# Patient Record
Sex: Female | Born: 1985 | Race: White | Hispanic: No | Marital: Single | Smoking: Current every day smoker
Health system: Southern US, Community
[De-identification: ages and names within clinical notes are randomized; demographics above are authoritative.]

## PROBLEM LIST (undated history)

## (undated) ENCOUNTER — Ambulatory Visit

## (undated) ENCOUNTER — Inpatient Hospital Stay (HOSPITAL_COMMUNITY): Payer: Self-pay

## (undated) DIAGNOSIS — N63 Unspecified lump in unspecified breast: Secondary | ICD-10-CM

## (undated) DIAGNOSIS — R45851 Suicidal ideations: Secondary | ICD-10-CM

## (undated) DIAGNOSIS — N289 Disorder of kidney and ureter, unspecified: Secondary | ICD-10-CM

## (undated) DIAGNOSIS — Z720 Tobacco use: Secondary | ICD-10-CM

## (undated) DIAGNOSIS — J069 Acute upper respiratory infection, unspecified: Secondary | ICD-10-CM

## (undated) DIAGNOSIS — J45909 Unspecified asthma, uncomplicated: Secondary | ICD-10-CM

## (undated) DIAGNOSIS — K219 Gastro-esophageal reflux disease without esophagitis: Secondary | ICD-10-CM

## (undated) HISTORY — PX: TRACHEOSTOMY: SUR1362

## (undated) HISTORY — DX: Acute upper respiratory infection, unspecified: J06.9

## (undated) HISTORY — PX: CHOLECYSTECTOMY: SHX55

## (undated) HISTORY — PX: CSF SHUNT: SHX92

## (undated) HISTORY — DX: Unspecified lump in unspecified breast: N63.0

## (undated) HISTORY — PX: BOWEL RESECTION: SHX1257

## (undated) HISTORY — DX: Tobacco use: Z72.0

## (undated) HISTORY — DX: Suicidal ideations: R45.851

---
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
## 2008-04-23 ENCOUNTER — Emergency Department (HOSPITAL_COMMUNITY): Admission: EM | Admit: 2008-04-23 | Discharge: 2008-04-23 | Payer: Self-pay | Admitting: Emergency Medicine

## 2008-04-23 IMAGING — US US PELVIS COMPLETE MODIFY
1 series · 14 of 25 positions shown · non-contrast
Comparison: None available

CLINICAL DATA: Adnexal tenderness

TRANSABDOMINAL AND TRANSVAGINAL ULTRASOUND OF PELVIS
TECHNIQUE: Both transabdominal and transvaginal ultrasound
examinations of the pelvis were performed including evaluation of
the uterus, ovaries, adnexal regions, and pelvic cul-de-sac.

[Series 1: unknown · 0.22mm/px · 14 of 53 slices shown]
[im 1/53]
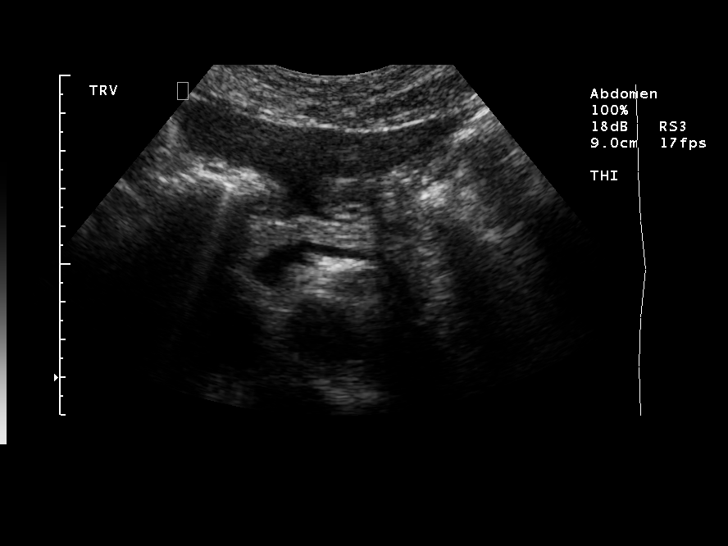
[im 5/53]
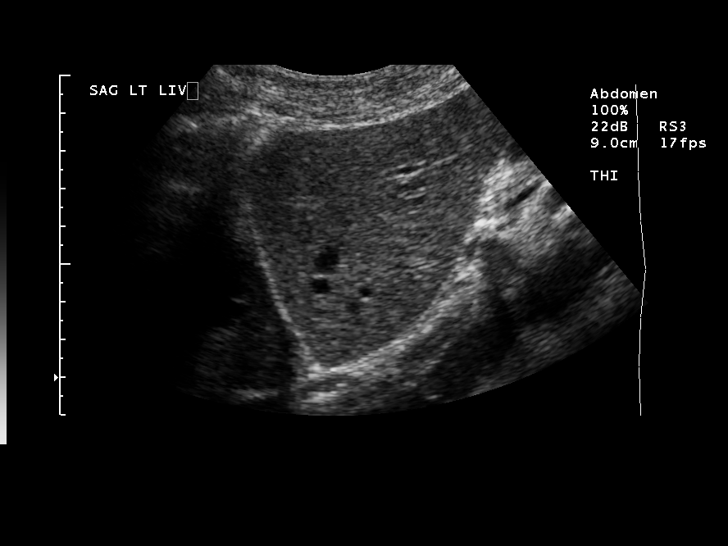
[im 9/53]
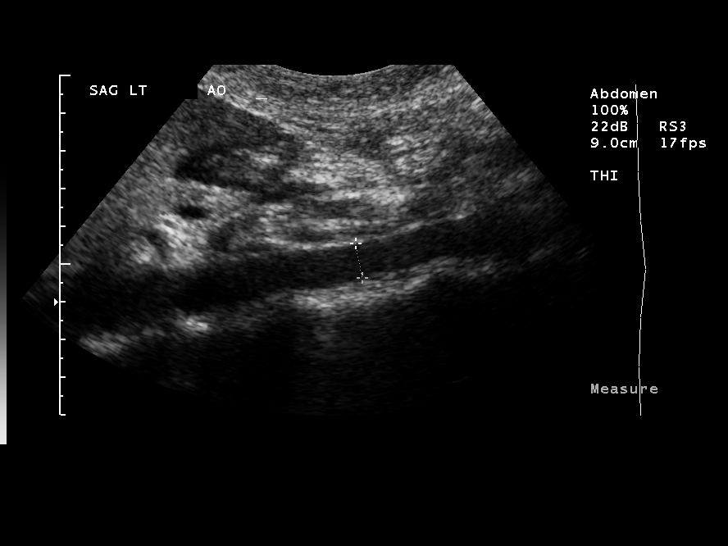
[im 14/53]
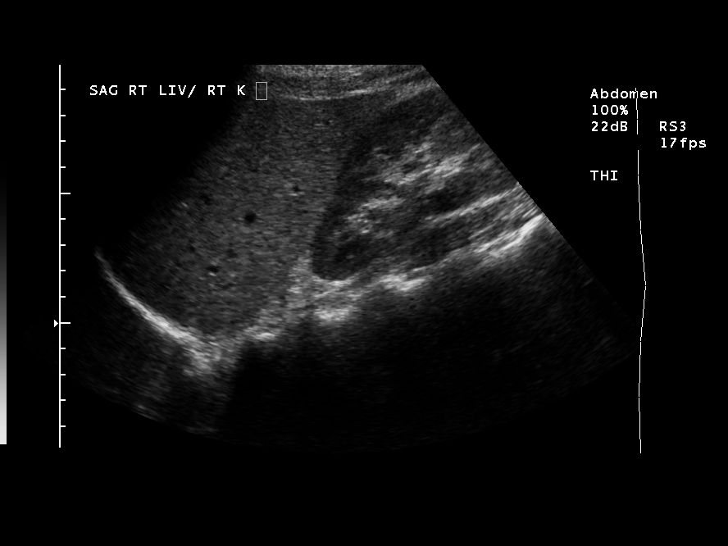
[im 18/53]
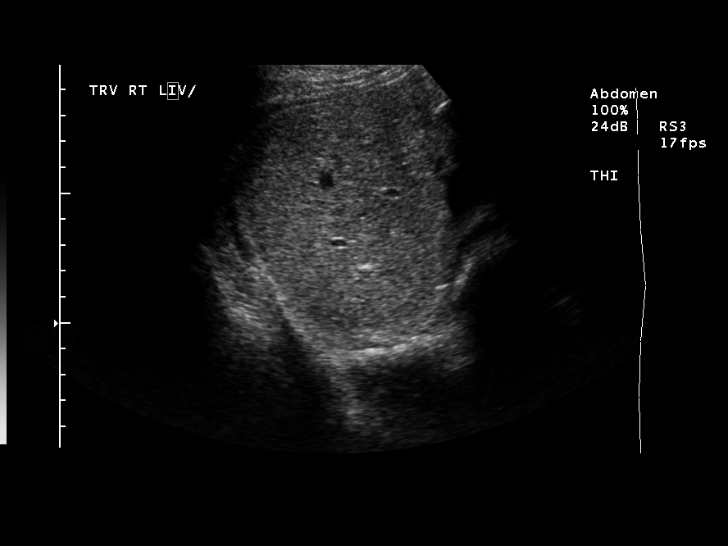
[im 20/53]
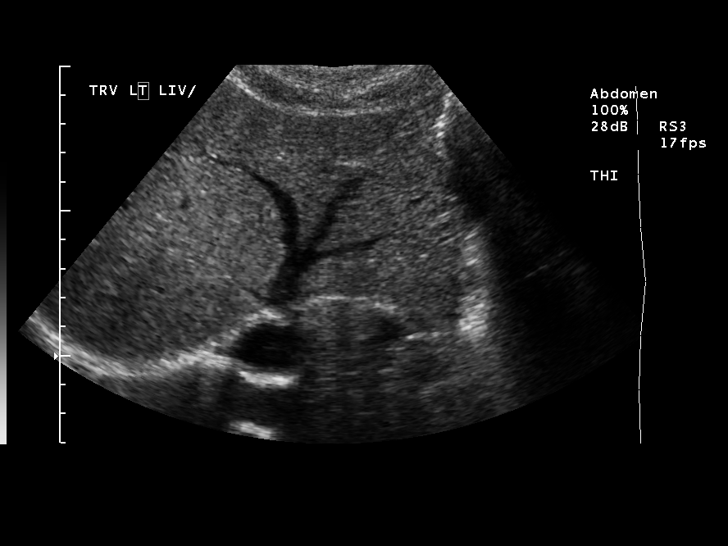
[im 24/53]
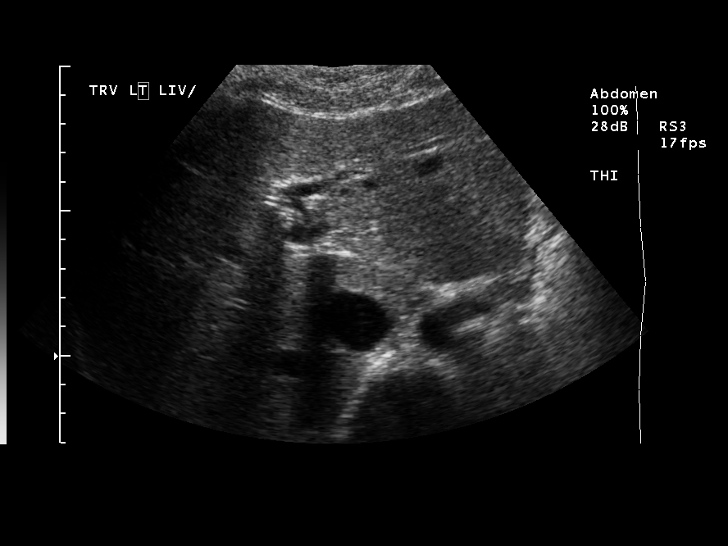
[im 29/53]
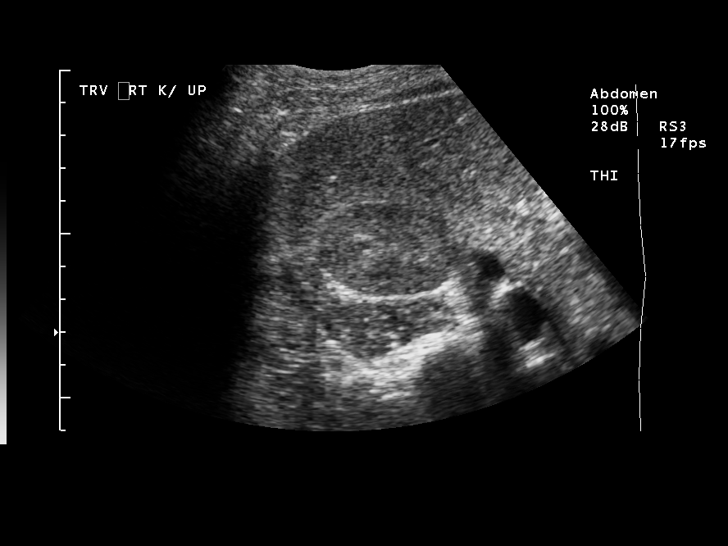
[im 33/53]
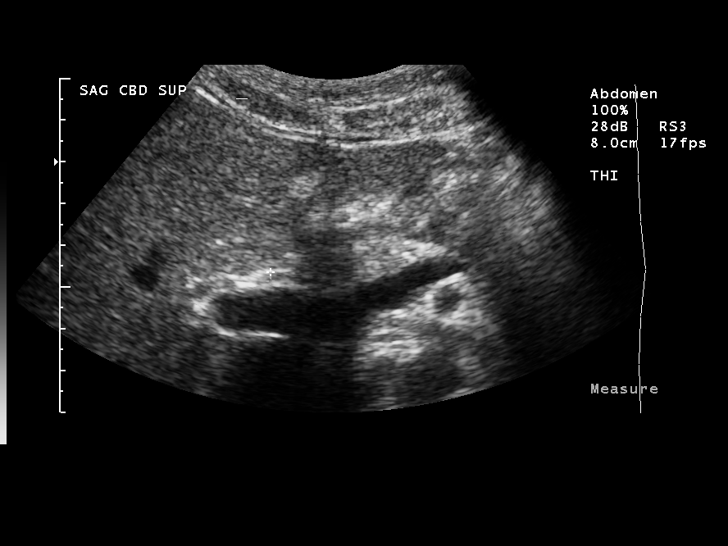
[im 35/53]
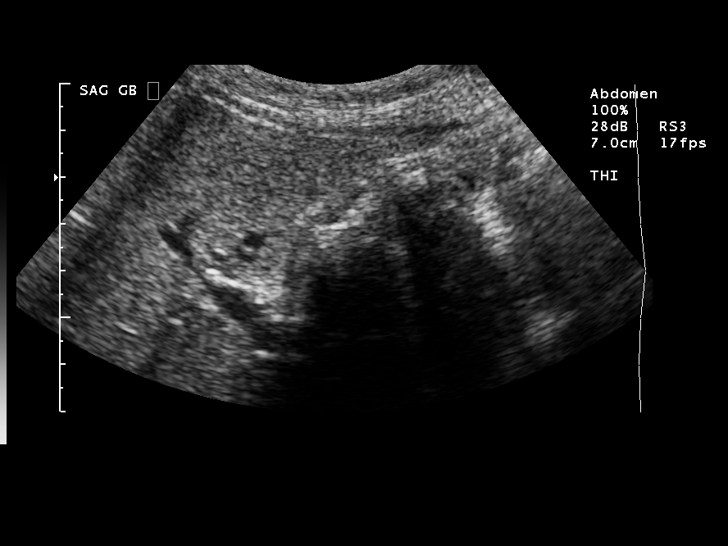
[im 40/53]
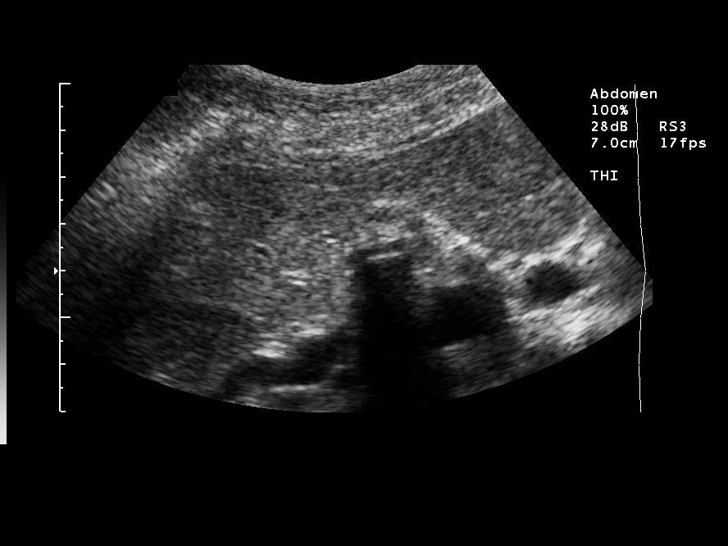
[im 44/53]
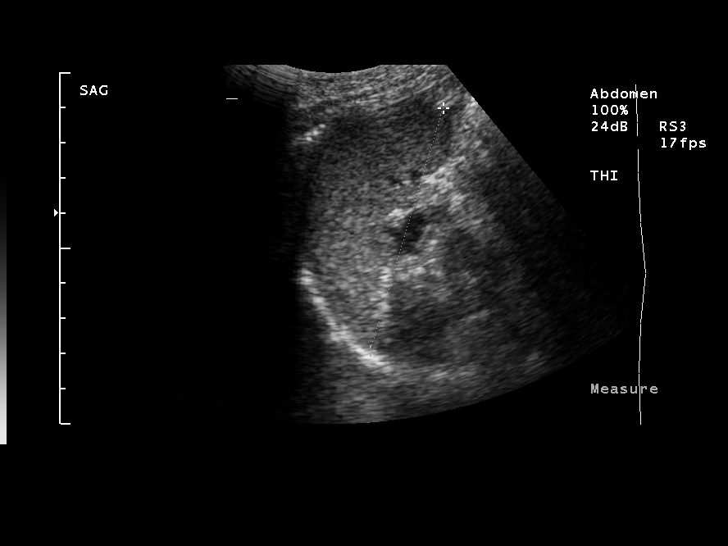
[im 48/53]
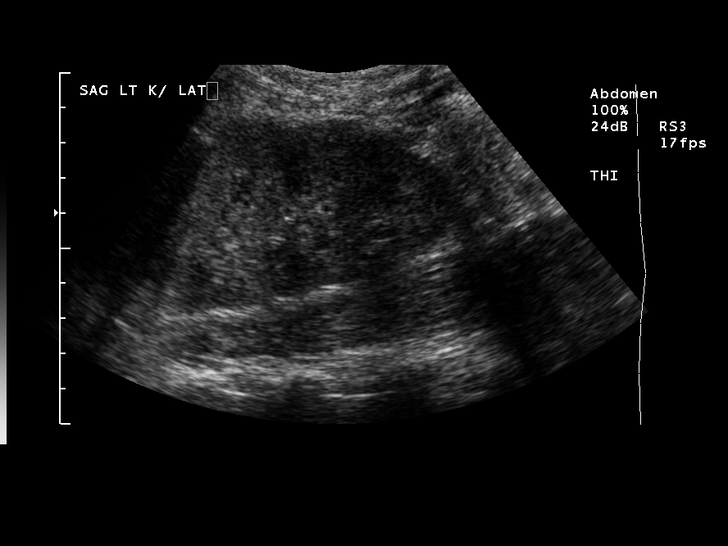
[im 53/53]
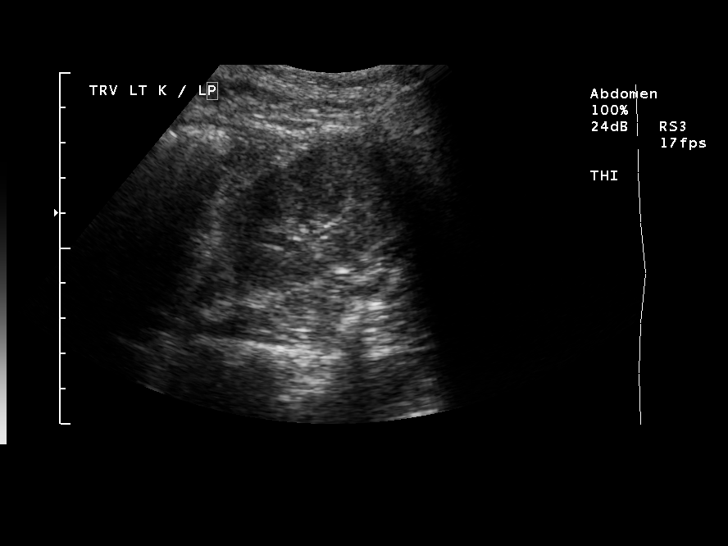

[14 of 25 positions shown; findings below may reference images not displayed]

FINDINGS: The uterus measures 33 x 33 x 61 mm with a normal-
appearing endometrial stripe measuring 1-5 mm in thickness.  Right
ovary 28 x 28 x 34 mm containing normal-appearing follicles with
normal color Doppler flow signal.  Left ovary 14 x 28 x 29 mm,
containing several normal-appearing follicles, with normal color
Doppler flow signal.  Layering debris is suggested in the urinary
bladder. No free fluid.  No adnexal mass.
IMPRESSION: 1.  Unremarkable uterus and ovaries.
2.  Layering debris in the urinary bladder, nonspecific.

## 2008-04-23 IMAGING — US US PELVIS COMPLETE MODIFY
1 series · 14 of 25 positions shown · non-contrast
Comparison: None available

CLINICAL DATA: Adnexal tenderness

TRANSABDOMINAL AND TRANSVAGINAL ULTRASOUND OF PELVIS
TECHNIQUE: Both transabdominal and transvaginal ultrasound
examinations of the pelvis were performed including evaluation of
the uterus, ovaries, adnexal regions, and pelvic cul-de-sac.

[Series 1: unknown · 0.23mm/px · 14 of 33 slices shown]
[im 1/33]
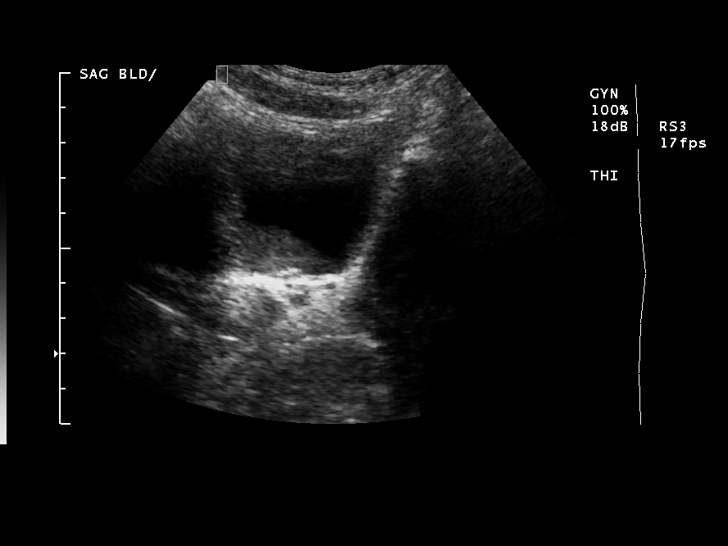
[im 3/33]
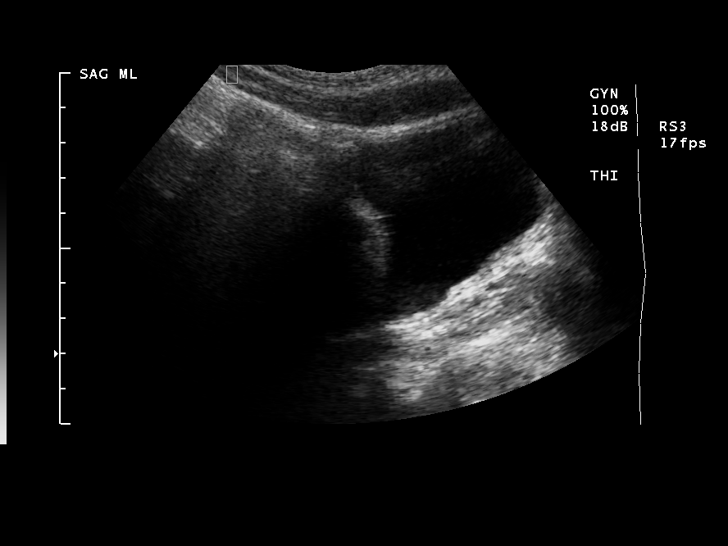
[im 6/33]
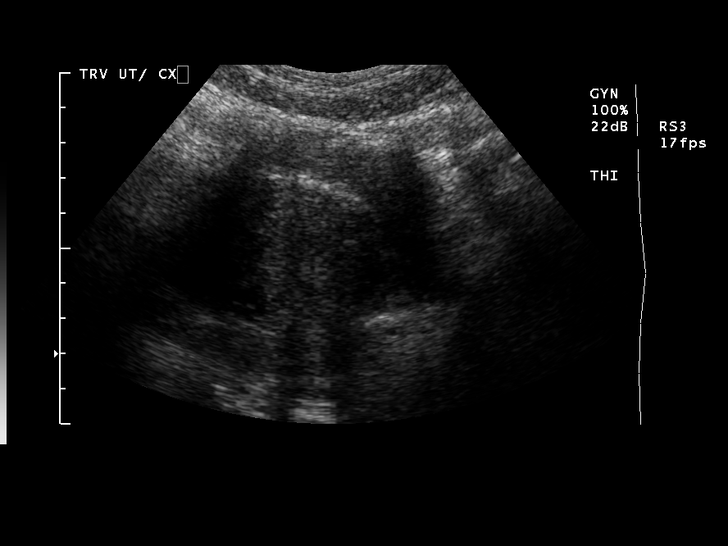
[im 9/33]
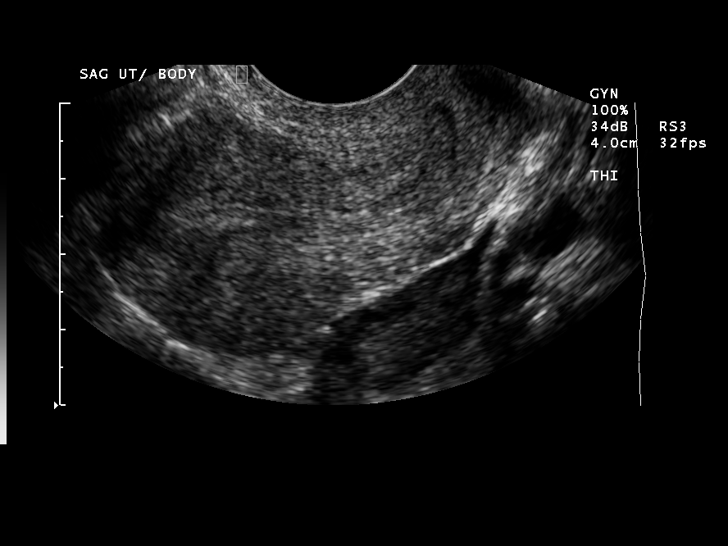
[im 11/33]
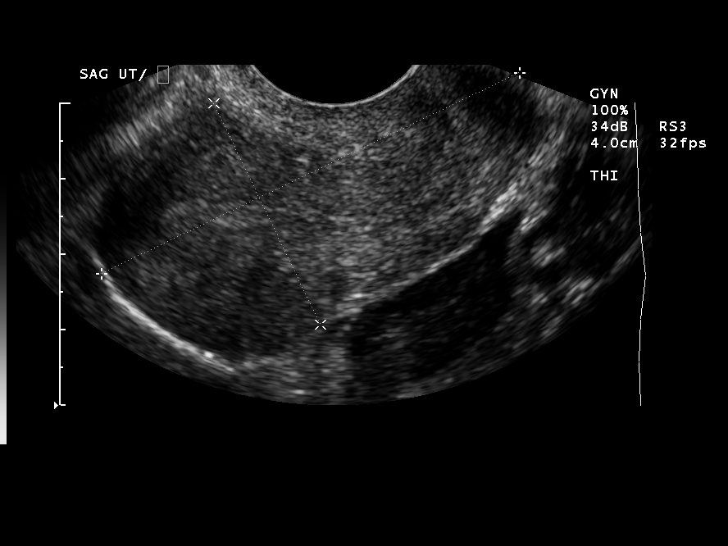
[im 13/33]
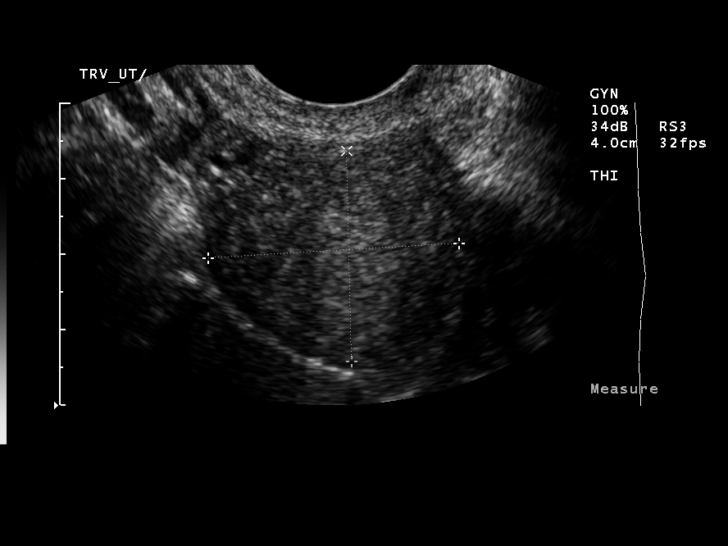
[im 15/33]
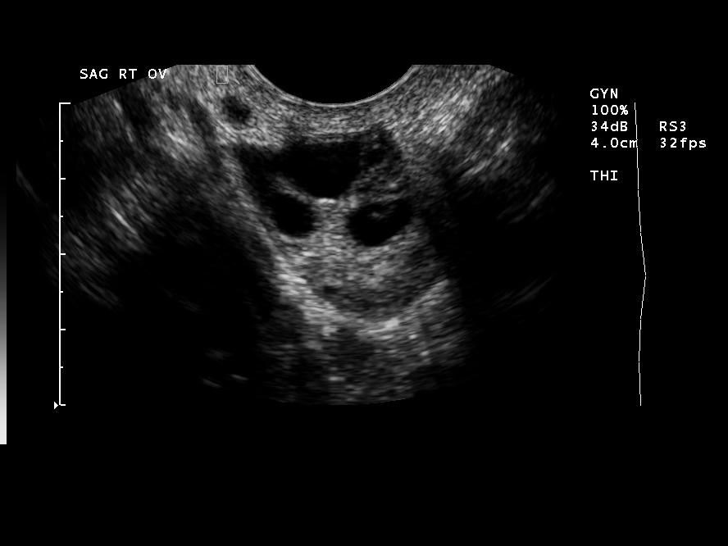
[im 18/33]
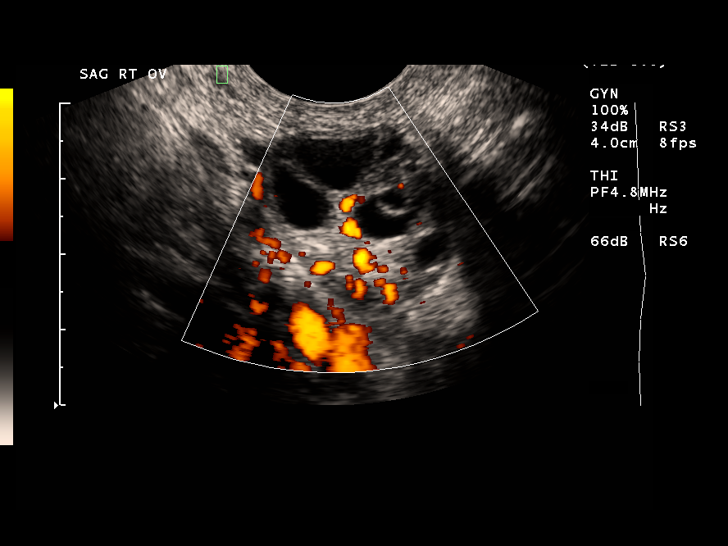
[im 21/33]
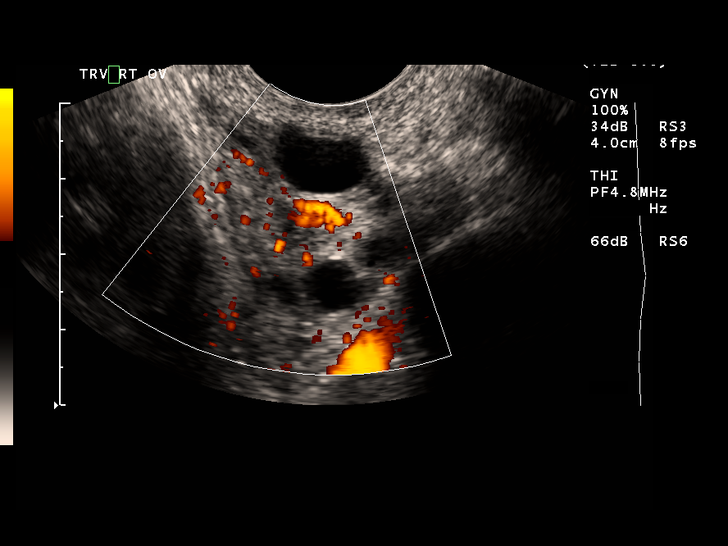
[im 22/33]
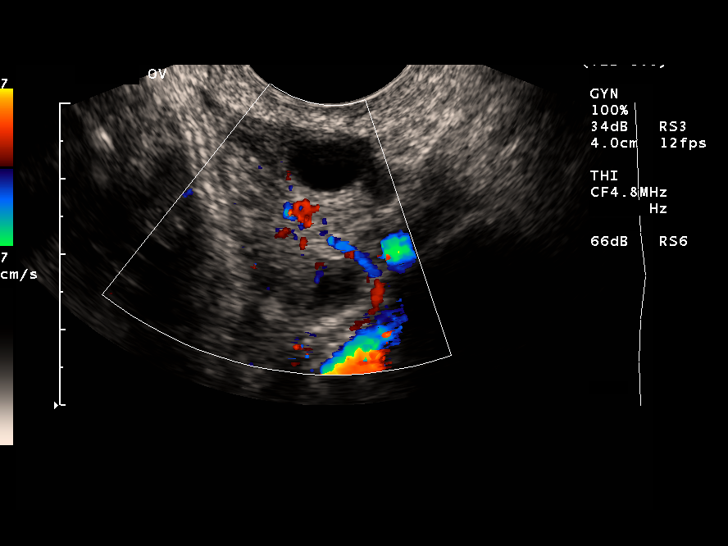
[im 25/33]
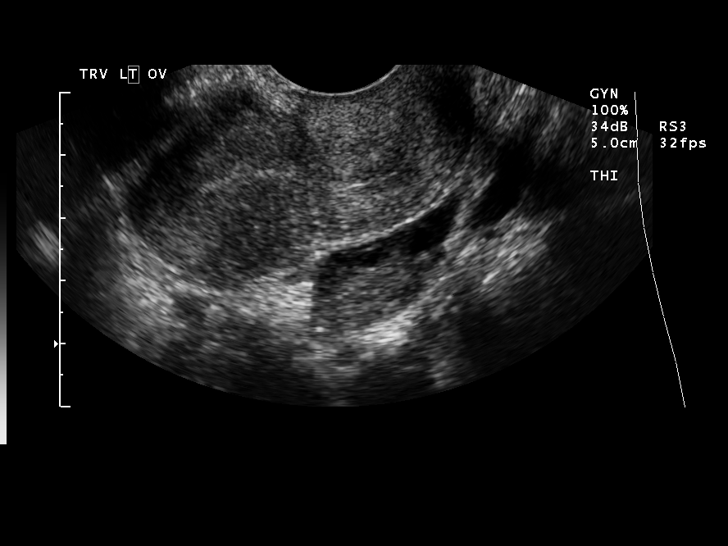
[im 27/33]
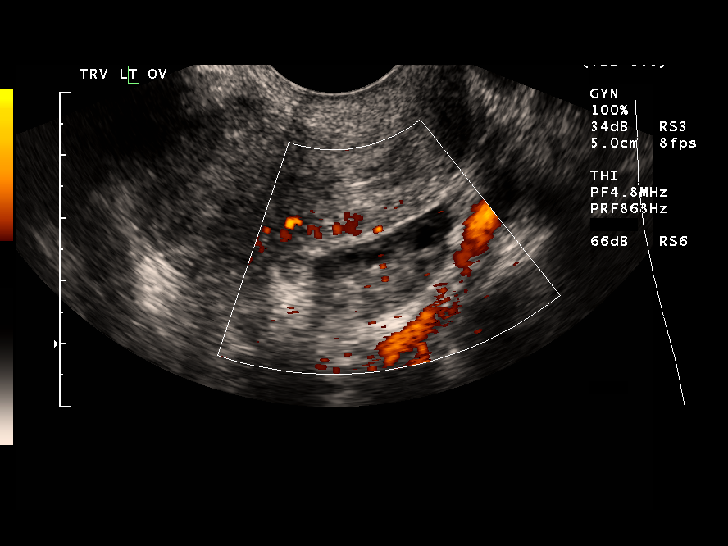
[im 30/33]
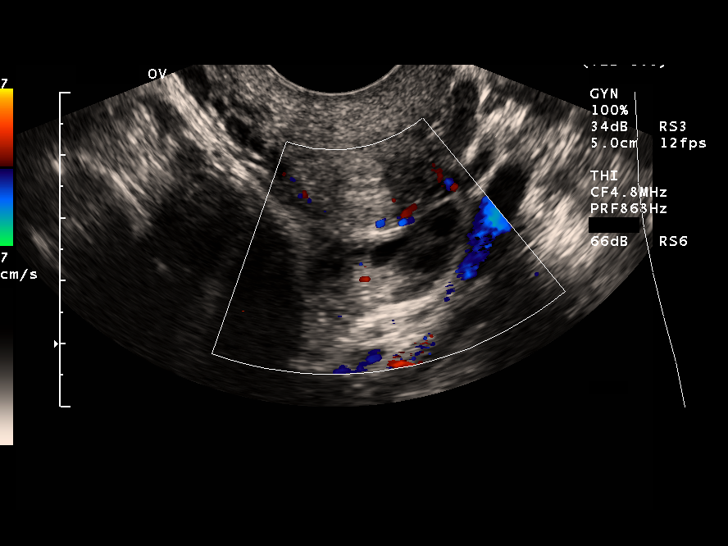
[im 33/33]
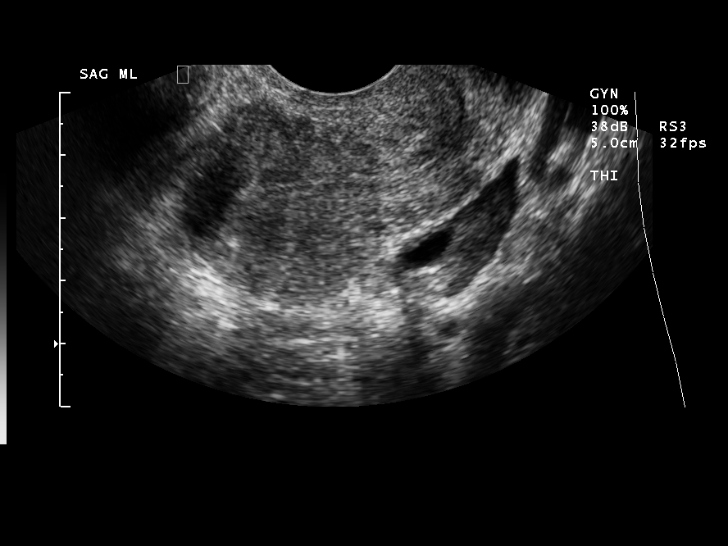

[14 of 25 positions shown; findings below may reference images not displayed]

FINDINGS: The uterus measures 33 x 33 x 61 mm with a normal-
appearing endometrial stripe measuring 1-5 mm in thickness.  Right
ovary 28 x 28 x 34 mm containing normal-appearing follicles with
normal color Doppler flow signal.  Left ovary 14 x 28 x 29 mm,
containing several normal-appearing follicles, with normal color
Doppler flow signal.  Layering debris is suggested in the urinary
bladder. No free fluid.  No adnexal mass.
IMPRESSION: 1.  Unremarkable uterus and ovaries.
2.  Layering debris in the urinary bladder, nonspecific.

## 2008-09-29 ENCOUNTER — Encounter: Payer: Self-pay | Admitting: Family Medicine

## 2008-10-28 ENCOUNTER — Encounter: Payer: Self-pay | Admitting: Family Medicine

## 2008-10-28 ENCOUNTER — Ambulatory Visit: Payer: Self-pay | Admitting: Family Medicine

## 2008-10-28 LAB — CONVERTED CEMR LAB
Basophils Absolute: 0 10*3/uL (ref 0.0–0.1)
Basophils Relative: 0 % (ref 0–1)
Eosinophils Absolute: 0 10*3/uL (ref 0.0–0.7)
MCHC: 34.3 g/dL (ref 30.0–36.0)
MCV: 99.7 fL (ref 78.0–100.0)
Neutrophils Relative %: 69 % (ref 43–77)
Platelets: 266 10*3/uL (ref 150–400)
Sickle Cell Screen: NEGATIVE
WBC: 10.8 10*3/uL — ABNORMAL HIGH (ref 4.0–10.5)

## 2008-11-04 ENCOUNTER — Ambulatory Visit: Payer: Self-pay | Admitting: Family Medicine

## 2008-11-04 ENCOUNTER — Encounter: Payer: Self-pay | Admitting: Family Medicine

## 2008-11-04 LAB — CONVERTED CEMR LAB: Whiff Test: NEGATIVE

## 2008-11-08 ENCOUNTER — Ambulatory Visit (HOSPITAL_COMMUNITY): Admission: RE | Admit: 2008-11-08 | Discharge: 2008-11-08 | Payer: Self-pay | Admitting: Obstetrics & Gynecology

## 2008-11-08 ENCOUNTER — Encounter: Payer: Self-pay | Admitting: Family Medicine

## 2008-11-08 IMAGING — US US OB COMP LESS 14 WK
1 series · 14 of 28 positions shown · non-contrast
Comparison: none

OBSTETRICAL ULTRASOUND:
 This ultrasound exam was performed in the [HOSPITAL] Ultrasound Department.  The OB US report was generated in the AS system, and faxed to the ordering physician.  This report is also available in [REDACTED] PACS.

[Series 1: us ob comp less 14 wks · 14 of 29 slices shown]
[im 2/29]
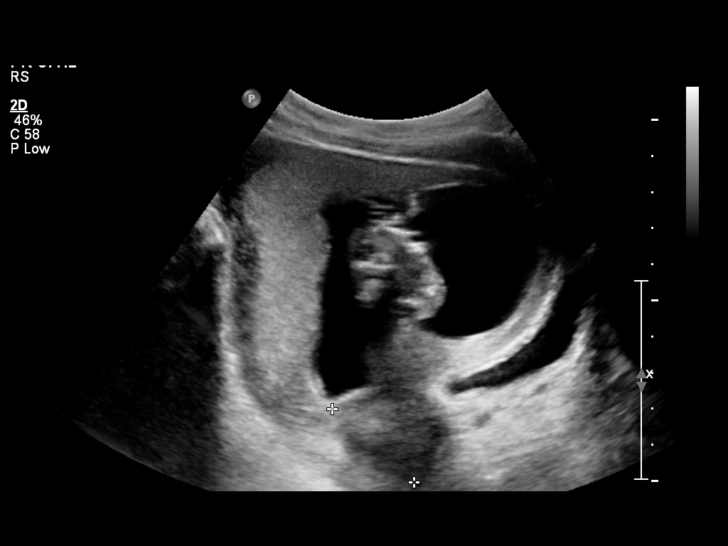
[im 4/29]
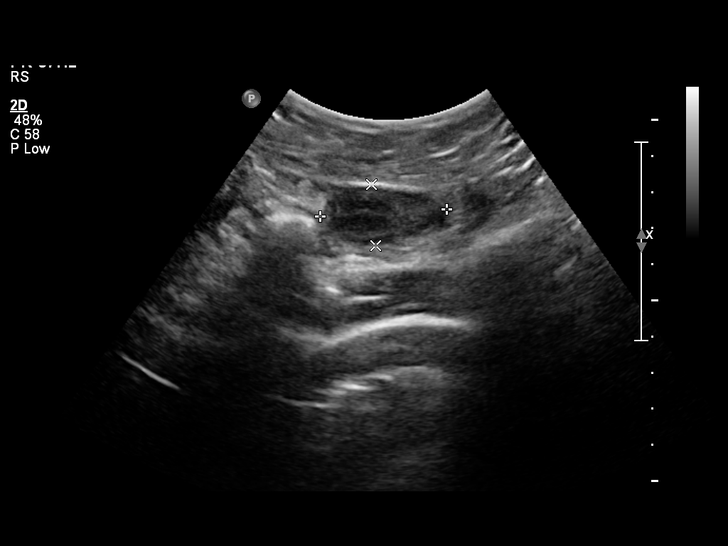
[im 6/29]
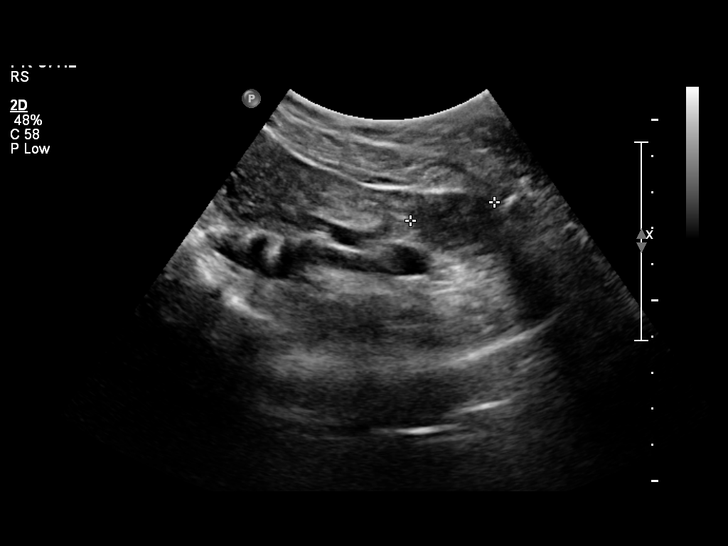
[im 8/29]
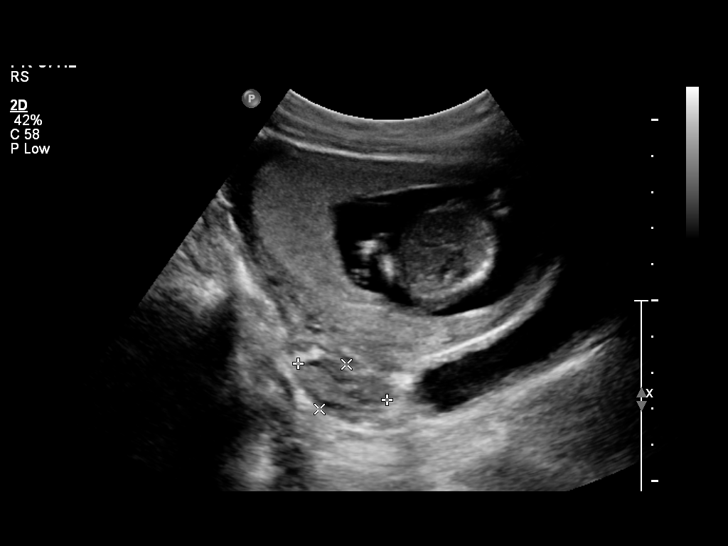
[im 10/29]
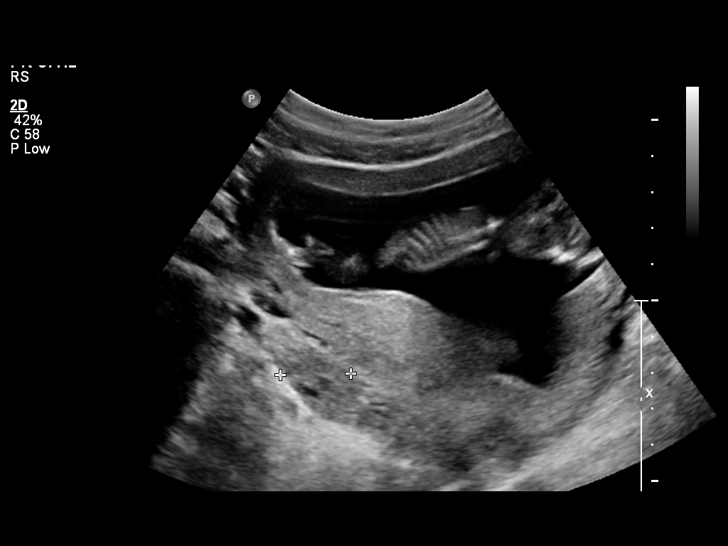
[im 12/29]
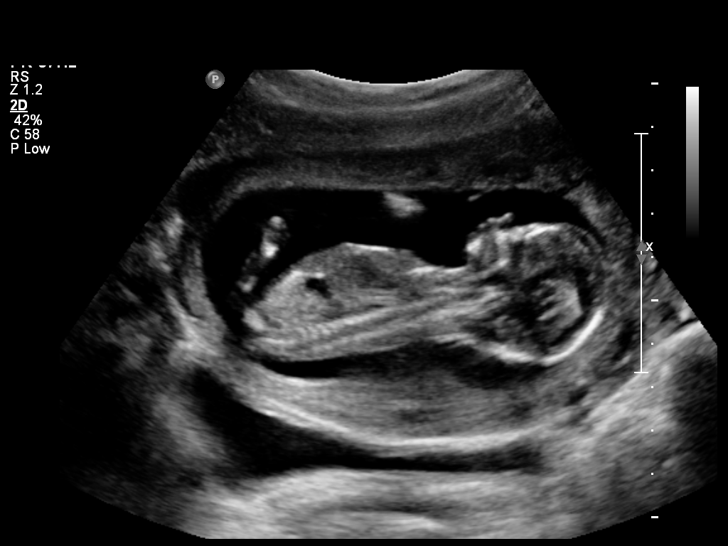
[im 14/29]
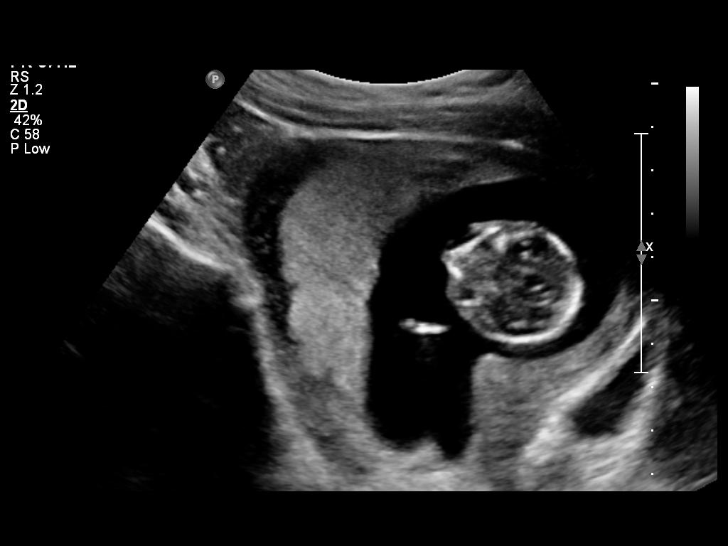
[im 16/29]
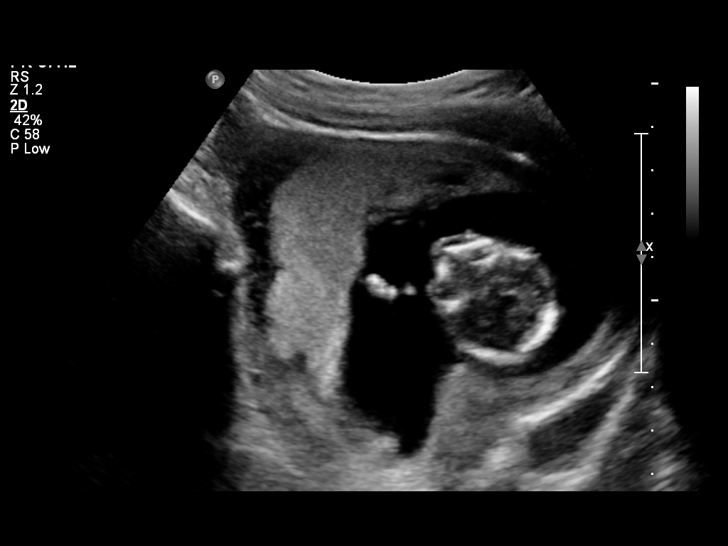
[im 18/29]
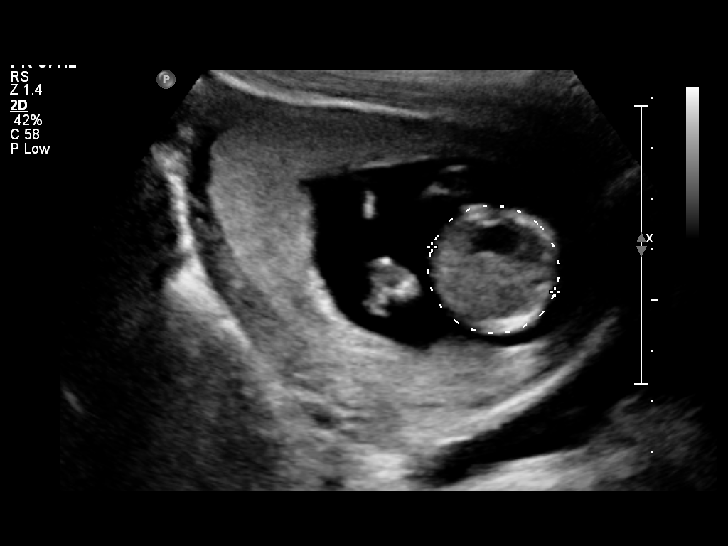
[im 20/29]
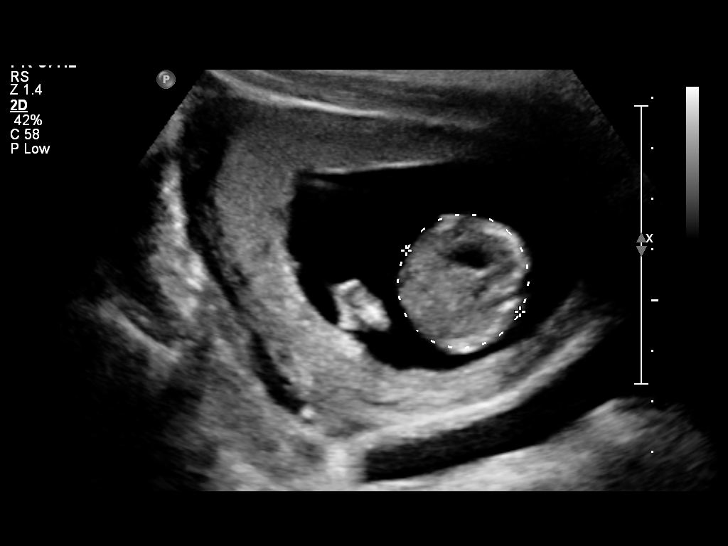
[im 22/29]
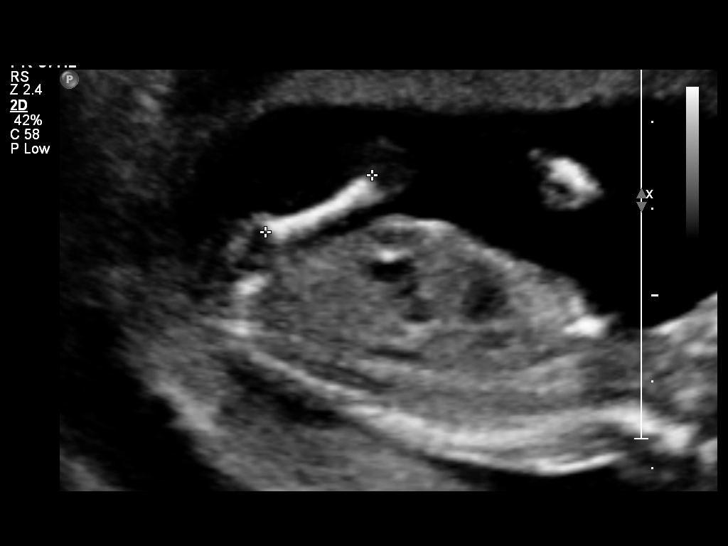
[im 24/29]
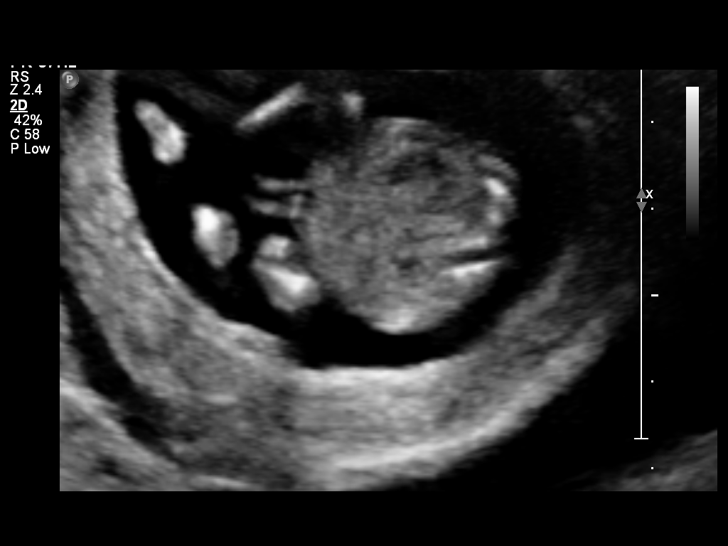
[im 26/29]
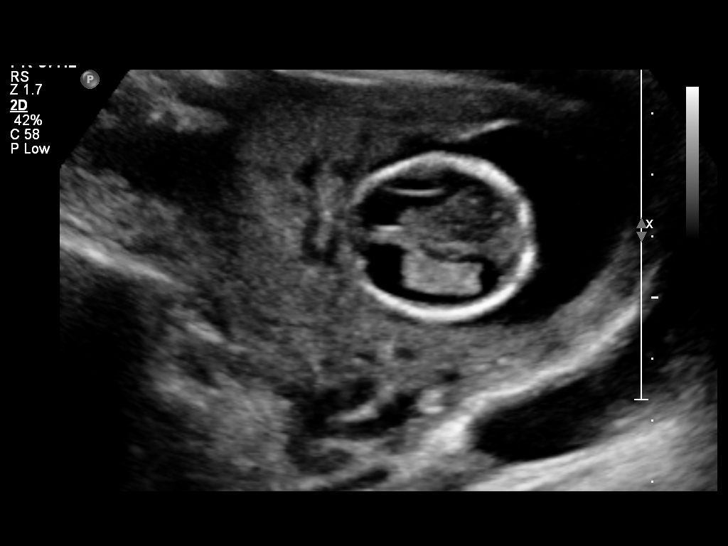
[im 29/29]
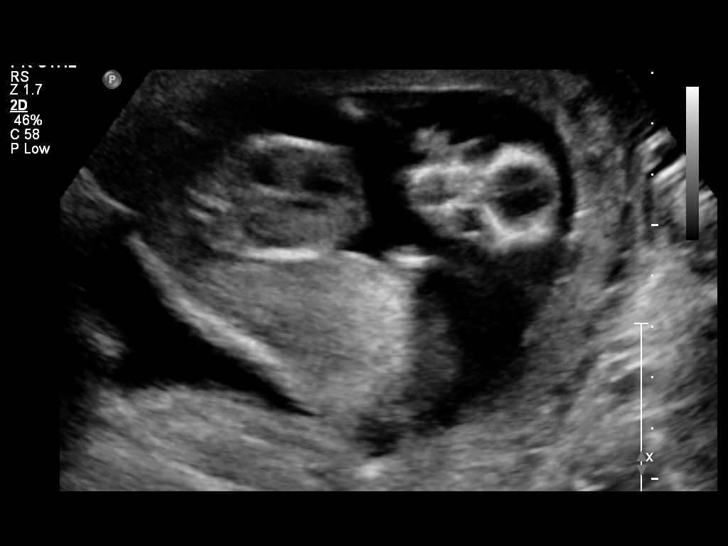

[14 of 28 positions shown; findings below may reference images not displayed]

IMPRESSION: See AS Obstetric US report.

## 2008-12-14 ENCOUNTER — Encounter: Payer: Self-pay | Admitting: Family Medicine

## 2008-12-14 ENCOUNTER — Ambulatory Visit: Payer: Self-pay | Admitting: Family Medicine

## 2008-12-14 LAB — CONVERTED CEMR LAB
CO2: 19 meq/L (ref 19–32)
Calcium: 9.1 mg/dL (ref 8.4–10.5)
Creatinine, Ser: 0.44 mg/dL (ref 0.40–1.20)
Glucose, Bld: 77 mg/dL (ref 70–99)
Total Bilirubin: 0.2 mg/dL — ABNORMAL LOW (ref 0.3–1.2)
Total Protein: 6.4 g/dL (ref 6.0–8.3)

## 2008-12-16 ENCOUNTER — Encounter: Payer: Self-pay | Admitting: Family Medicine

## 2008-12-16 ENCOUNTER — Ambulatory Visit (HOSPITAL_COMMUNITY): Admission: RE | Admit: 2008-12-16 | Discharge: 2008-12-16 | Payer: Self-pay | Admitting: Obstetrics & Gynecology

## 2008-12-16 IMAGING — US US OB COMP +14 WK
1 series · 14 of 28 positions shown · non-contrast
Comparison: none

OBSTETRICAL ULTRASOUND:
 This ultrasound exam was performed in the [HOSPITAL] Ultrasound Department.  The OB US report was generated in the AS system, and faxed to the ordering physician.  This report is also available in [REDACTED] PACS.

[Series 1: us ob comp +14 wk · 14 of 61 slices shown]
[im 3/61]
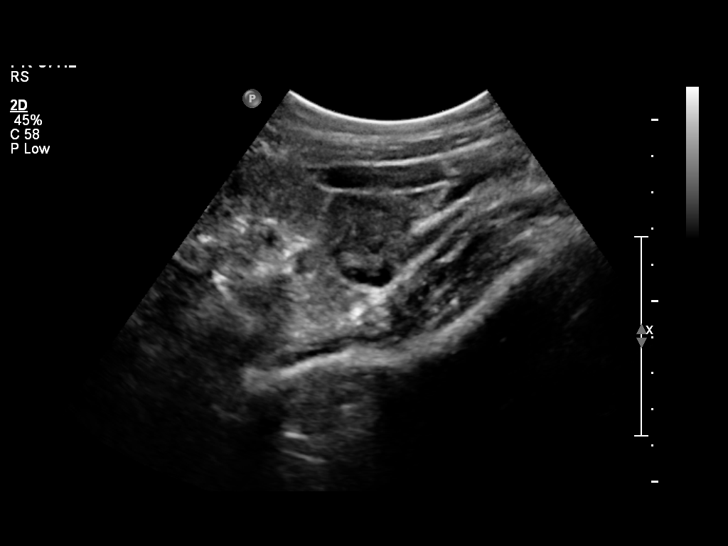
[im 7/61]
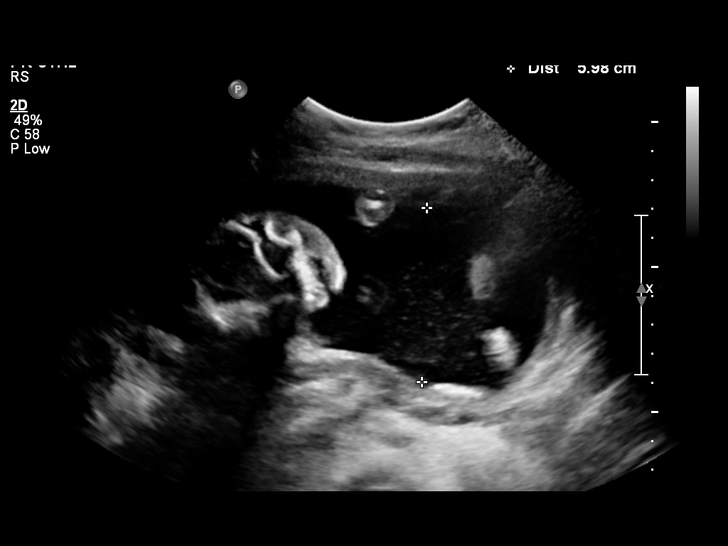
[im 12/61]
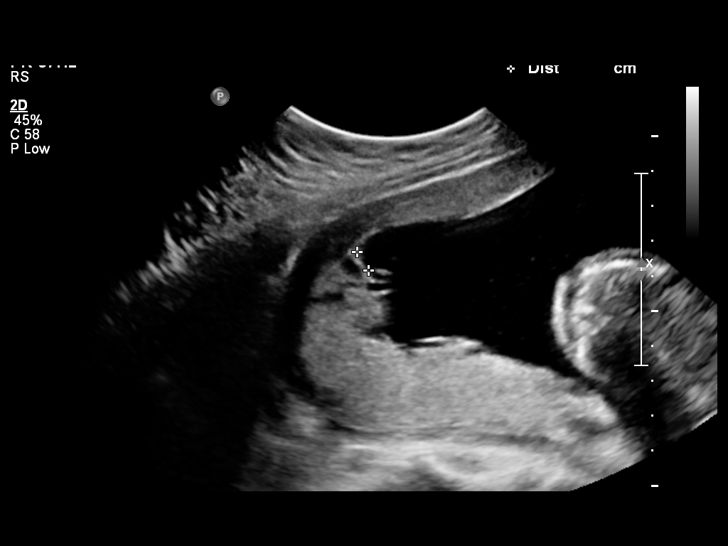
[im 16/61]
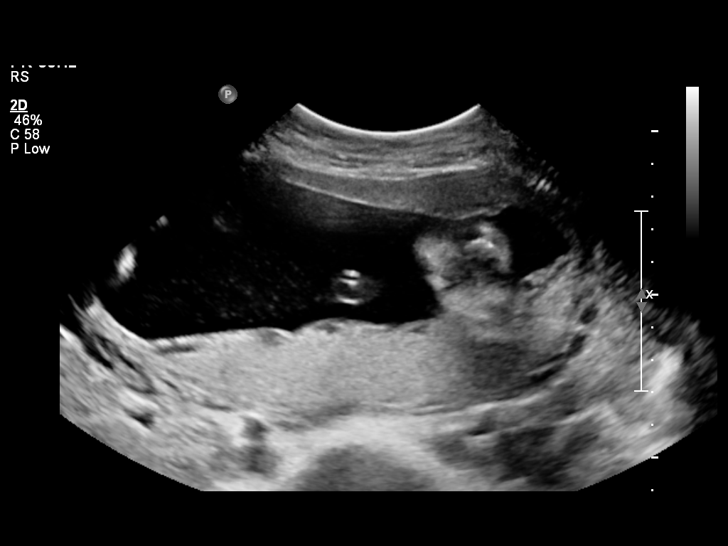
[im 21/61]
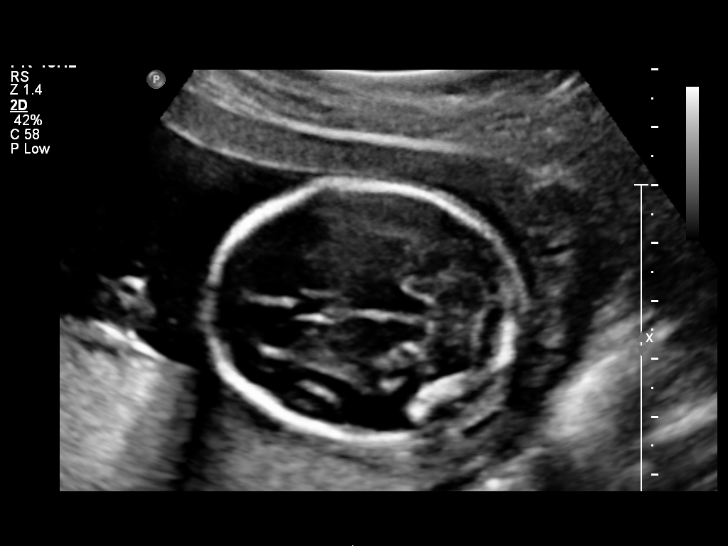
[im 25/61]
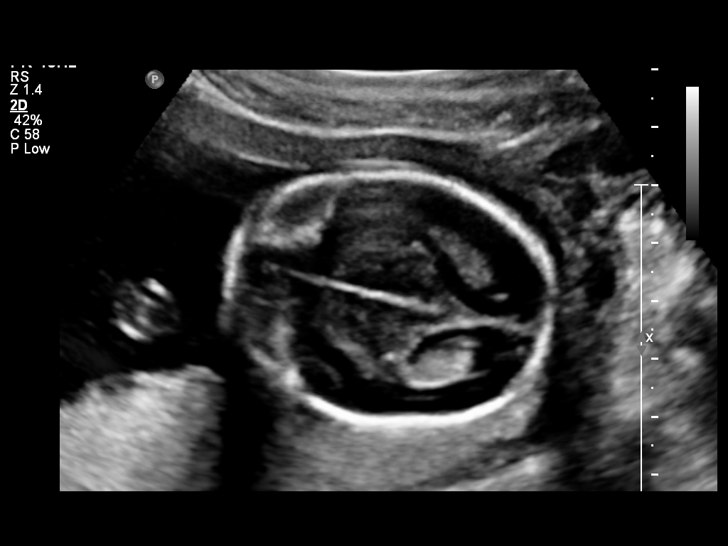
[im 29/61]
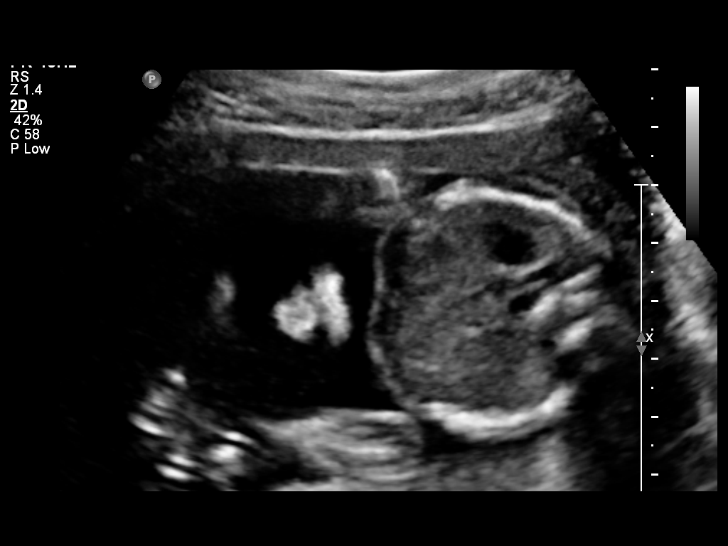
[im 34/61]
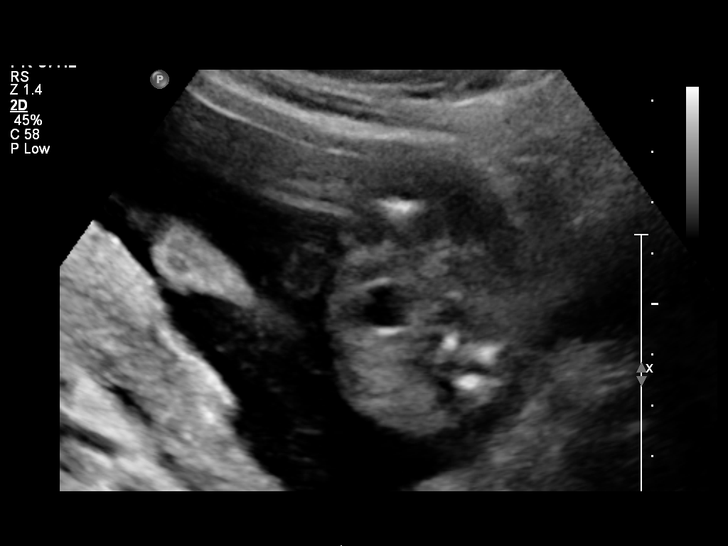
[im 38/61]
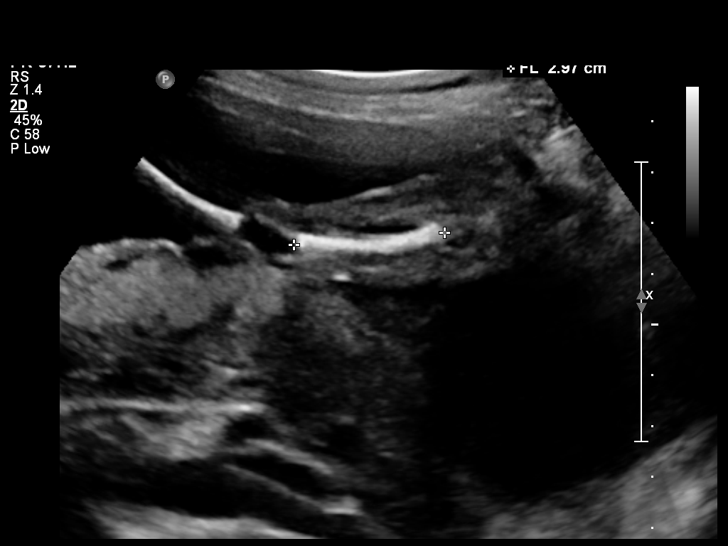
[im 43/61]
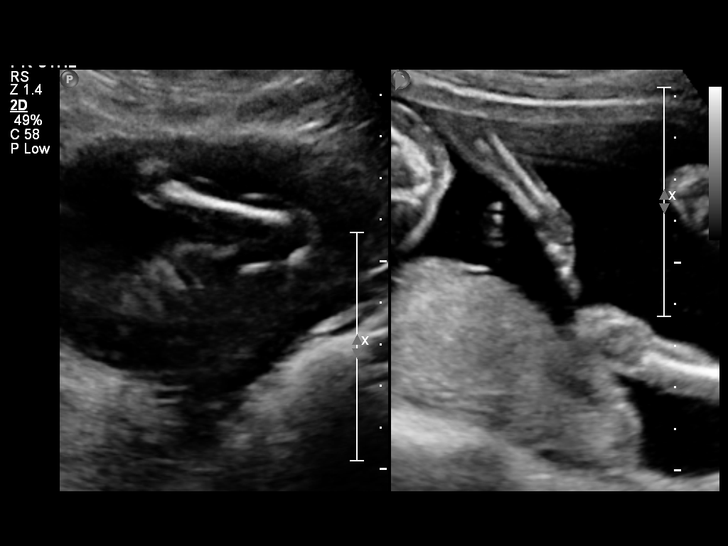
[im 47/61]
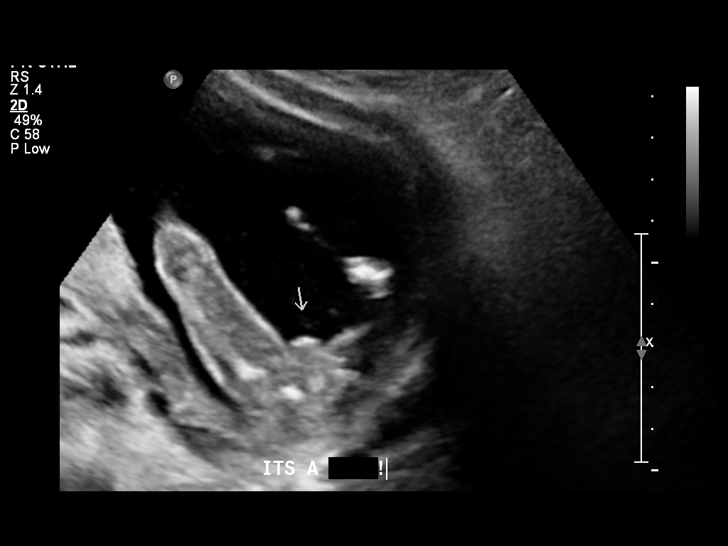
[im 52/61]
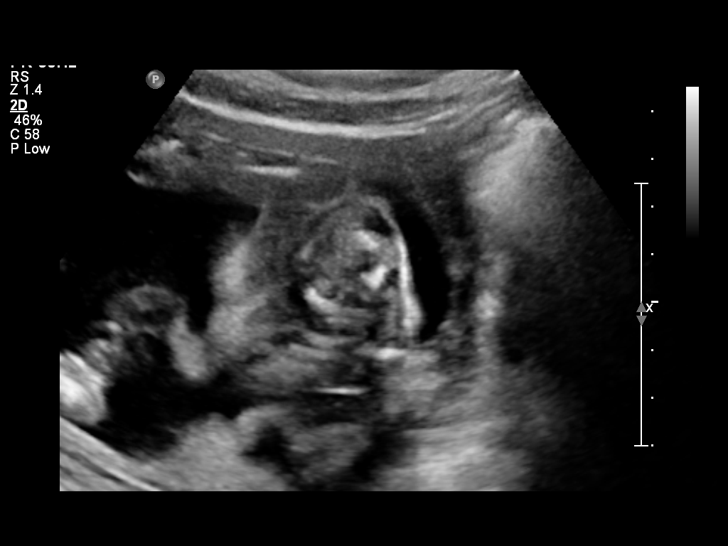
[im 56/61]
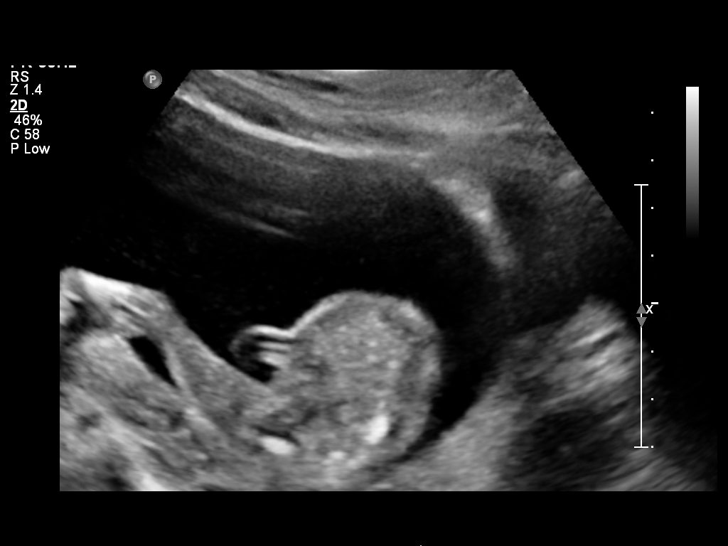
[im 61/61]
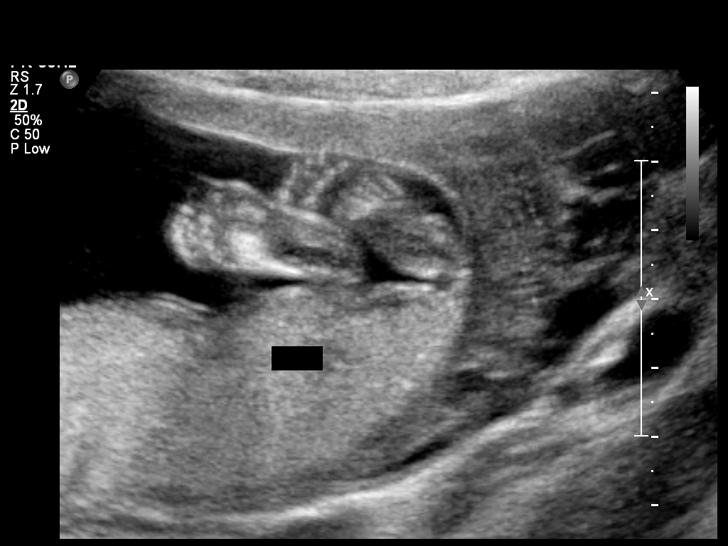

[14 of 28 positions shown; findings below may reference images not displayed]

IMPRESSION: See AS Obstetric US report.

## 2008-12-20 ENCOUNTER — Encounter: Payer: Self-pay | Admitting: *Deleted

## 2009-01-13 ENCOUNTER — Encounter: Payer: Self-pay | Admitting: *Deleted

## 2009-01-13 ENCOUNTER — Ambulatory Visit: Payer: Self-pay | Admitting: Family Medicine

## 2009-01-18 ENCOUNTER — Encounter: Payer: Self-pay | Admitting: Family Medicine

## 2009-01-18 ENCOUNTER — Ambulatory Visit (HOSPITAL_COMMUNITY): Admission: RE | Admit: 2009-01-18 | Discharge: 2009-01-18 | Payer: Self-pay | Admitting: Family Medicine

## 2009-01-18 IMAGING — US US OB FOLLOW-UP
2 series · 14 of 28 positions shown · non-contrast
Comparison: none

OBSTETRICAL ULTRASOUND:
 This ultrasound exam was performed in the [HOSPITAL] Ultrasound Department.  The OB US report was generated in the AS system, and faxed to the ordering physician.  This report is also available in [REDACTED] PACS.

[Series 1: us ob follow up · 0.27mm/px · 2 of 5 slices shown (1 of 2)]
[im 2/5]
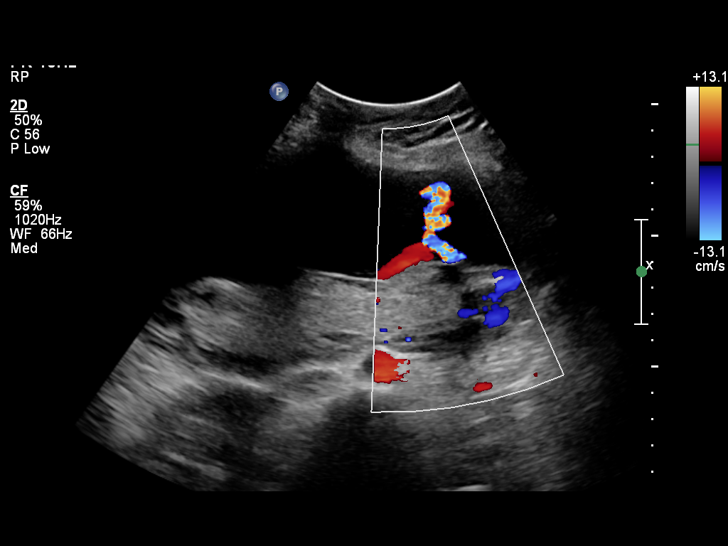
[im 5/5]
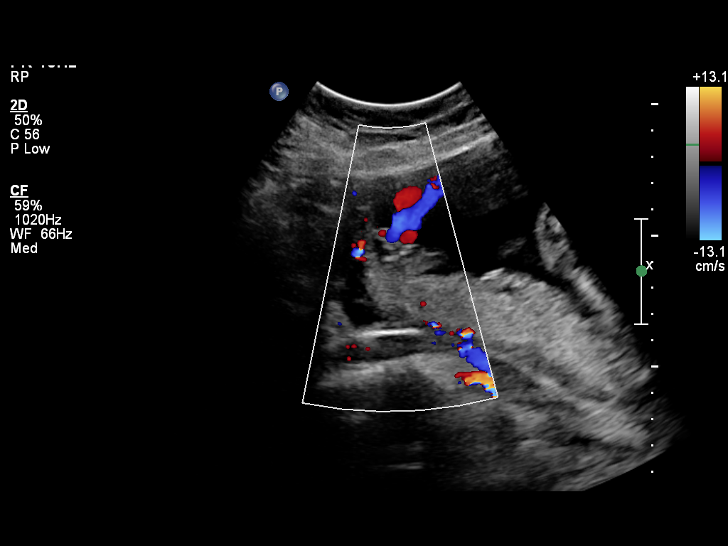

[Series 1: us ob follow up · 12 of 31 slices shown (2 of 2)]
[im 2/31]
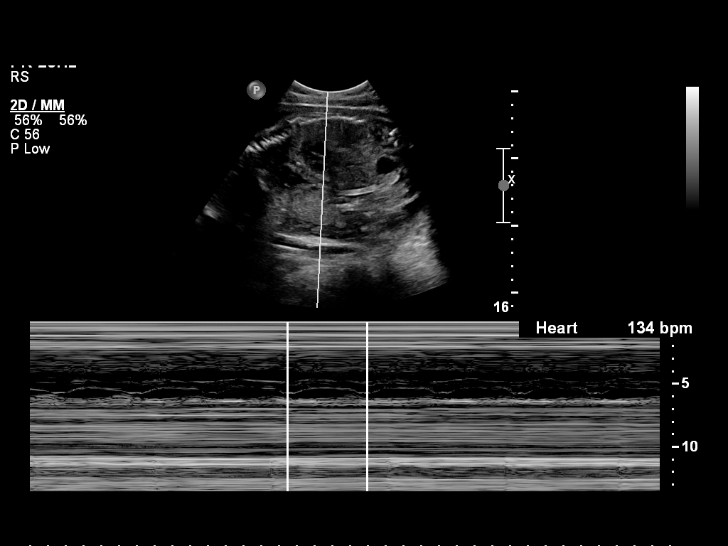
[im 4/31]
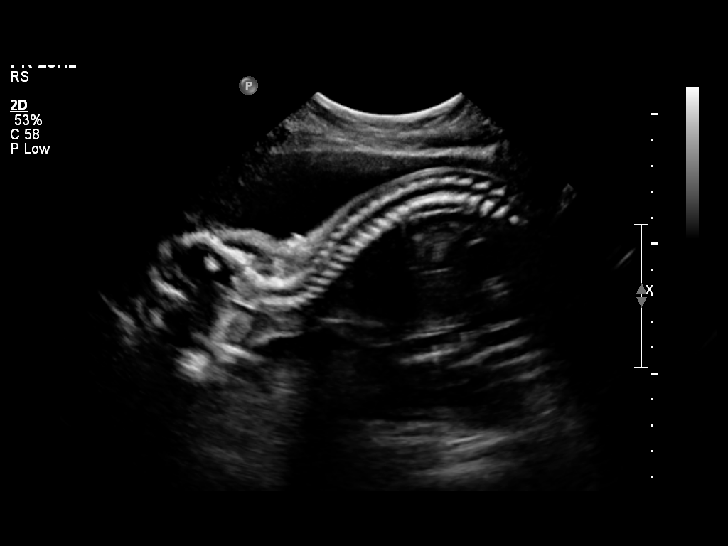
[im 7/31]
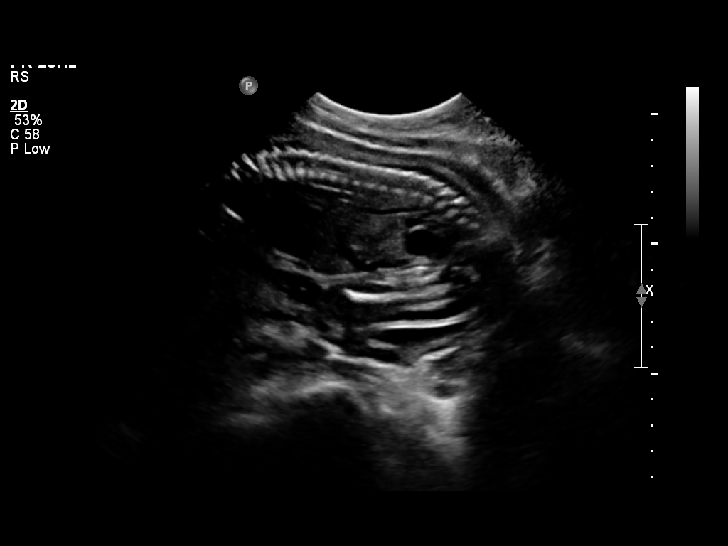
[im 10/31]
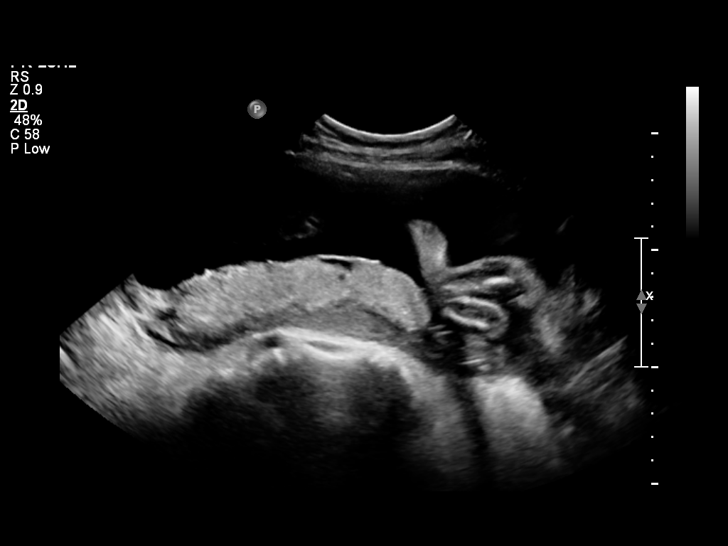
[im 12/31]
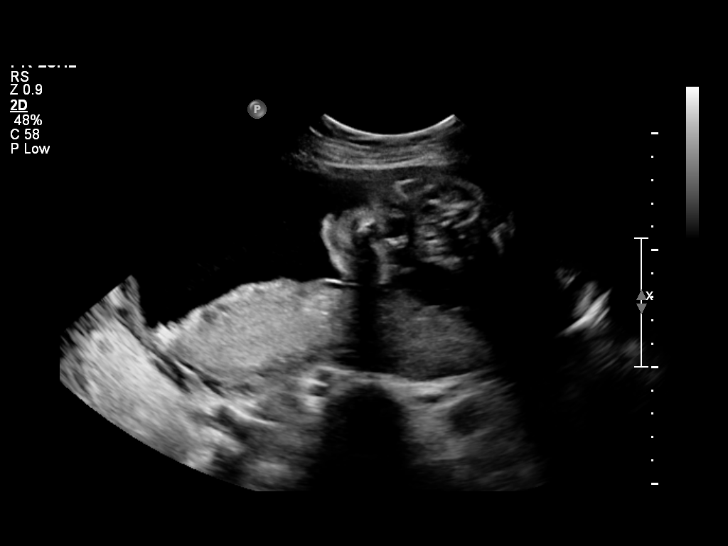
[im 15/31]
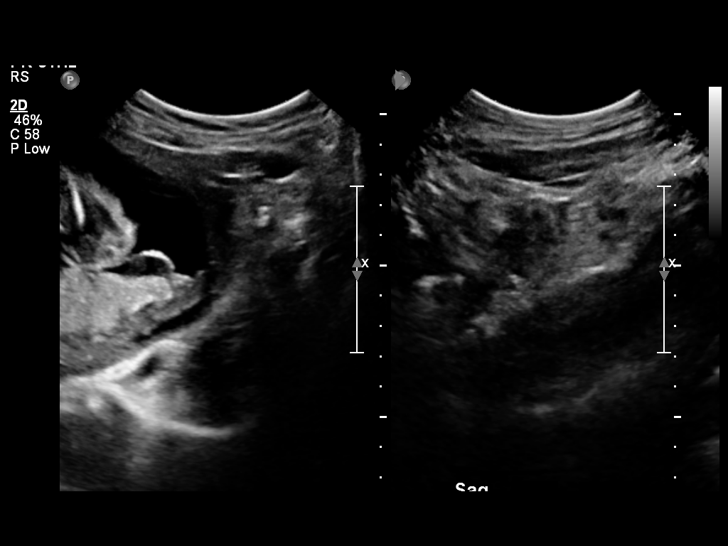
[im 17/31]
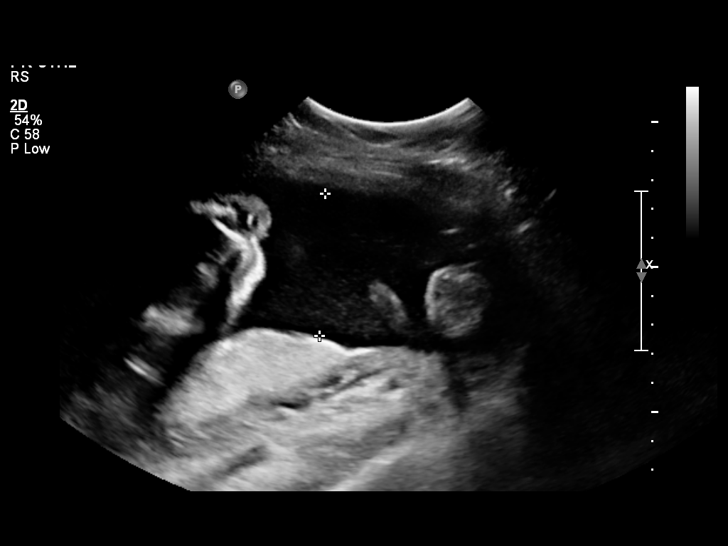
[im 20/31]
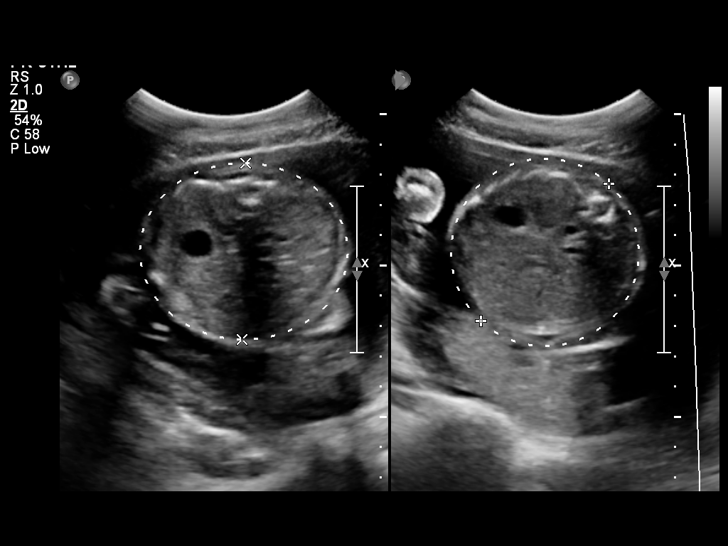
[im 23/31]
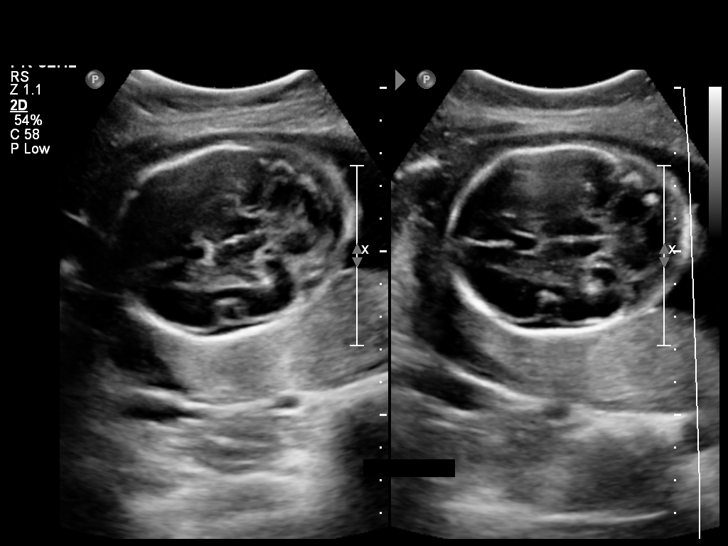
[im 25/31]
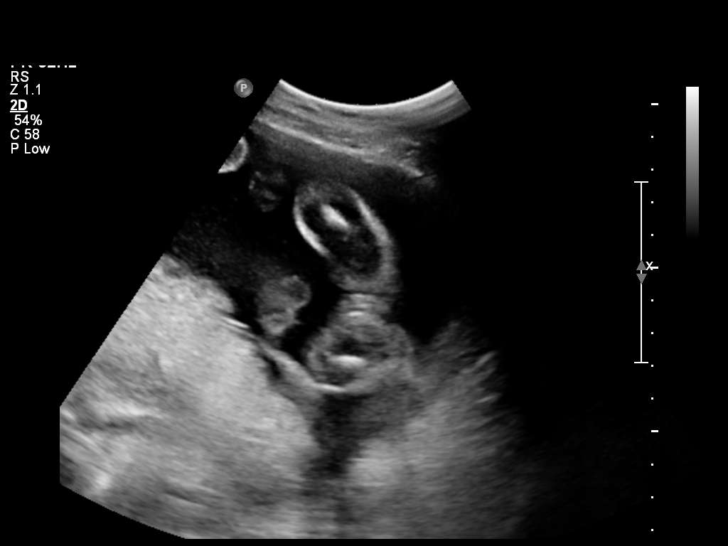
[im 28/31]
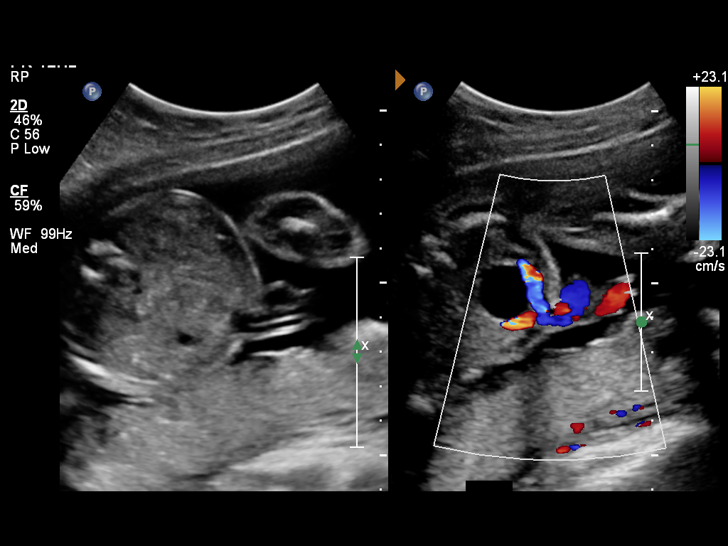
[im 31/31]
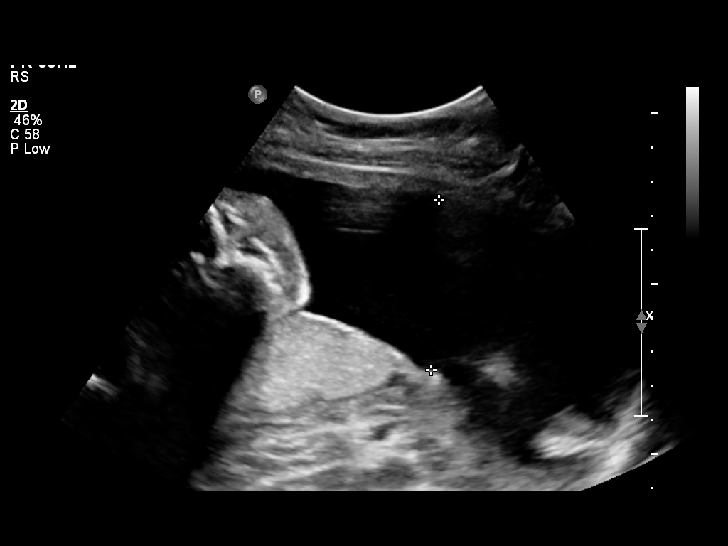

[14 of 28 positions shown; findings below may reference images not displayed]

IMPRESSION: See AS Obstetric US report.

## 2009-01-21 ENCOUNTER — Encounter: Payer: Self-pay | Admitting: *Deleted

## 2009-02-15 ENCOUNTER — Encounter: Payer: Self-pay | Admitting: Family Medicine

## 2009-02-15 ENCOUNTER — Ambulatory Visit: Payer: Self-pay | Admitting: Family Medicine

## 2009-02-15 DIAGNOSIS — N898 Other specified noninflammatory disorders of vagina: Secondary | ICD-10-CM | POA: Insufficient documentation

## 2009-02-15 LAB — CONVERTED CEMR LAB
GC Probe Amp, Genital: NEGATIVE
Whiff Test: NEGATIVE

## 2009-02-16 ENCOUNTER — Telehealth: Payer: Self-pay | Admitting: Family Medicine

## 2009-02-17 ENCOUNTER — Telehealth: Payer: Self-pay | Admitting: Family Medicine

## 2009-02-22 ENCOUNTER — Ambulatory Visit: Payer: Self-pay | Admitting: Family Medicine

## 2009-02-22 ENCOUNTER — Encounter: Payer: Self-pay | Admitting: Family Medicine

## 2009-02-24 ENCOUNTER — Telehealth: Payer: Self-pay | Admitting: Family Medicine

## 2009-02-25 ENCOUNTER — Inpatient Hospital Stay (HOSPITAL_COMMUNITY): Admission: AD | Admit: 2009-02-25 | Discharge: 2009-02-25 | Payer: Self-pay | Admitting: Obstetrics & Gynecology

## 2009-02-25 ENCOUNTER — Ambulatory Visit: Payer: Self-pay | Admitting: Family

## 2009-03-01 ENCOUNTER — Ambulatory Visit: Payer: Self-pay | Admitting: Family Medicine

## 2009-03-01 ENCOUNTER — Telehealth: Payer: Self-pay | Admitting: Family Medicine

## 2009-03-01 ENCOUNTER — Encounter: Payer: Self-pay | Admitting: Family Medicine

## 2009-03-01 DIAGNOSIS — K089 Disorder of teeth and supporting structures, unspecified: Secondary | ICD-10-CM | POA: Insufficient documentation

## 2009-03-01 DIAGNOSIS — J069 Acute upper respiratory infection, unspecified: Secondary | ICD-10-CM

## 2009-03-01 HISTORY — DX: Acute upper respiratory infection, unspecified: J06.9

## 2009-03-08 DIAGNOSIS — O99810 Abnormal glucose complicating pregnancy: Secondary | ICD-10-CM | POA: Insufficient documentation

## 2009-03-09 ENCOUNTER — Ambulatory Visit: Payer: Self-pay | Admitting: Family Medicine

## 2009-03-14 ENCOUNTER — Ambulatory Visit: Payer: Self-pay | Admitting: Obstetrics & Gynecology

## 2009-03-14 ENCOUNTER — Encounter: Admission: RE | Admit: 2009-03-14 | Discharge: 2009-04-20 | Payer: Self-pay | Admitting: Obstetrics & Gynecology

## 2009-03-18 ENCOUNTER — Ambulatory Visit (HOSPITAL_COMMUNITY): Admission: RE | Admit: 2009-03-18 | Discharge: 2009-03-18 | Payer: Self-pay | Admitting: Obstetrics & Gynecology

## 2009-03-18 IMAGING — US US OB FOLLOW-UP
1 series · 14 of 28 positions shown · non-contrast
Comparison: none

OBSTETRICAL ULTRASOUND:
 This ultrasound exam was performed in the [HOSPITAL] Ultrasound Department.  The OB US report was generated in the AS system, and faxed to the ordering physician.  This report is also available in [HOSPITAL]?s [employer] and in [REDACTED] PACS.

[Series 1: us ob follow up · 0.24mm/px · 14 of 29 slices shown]
[im 2/29]
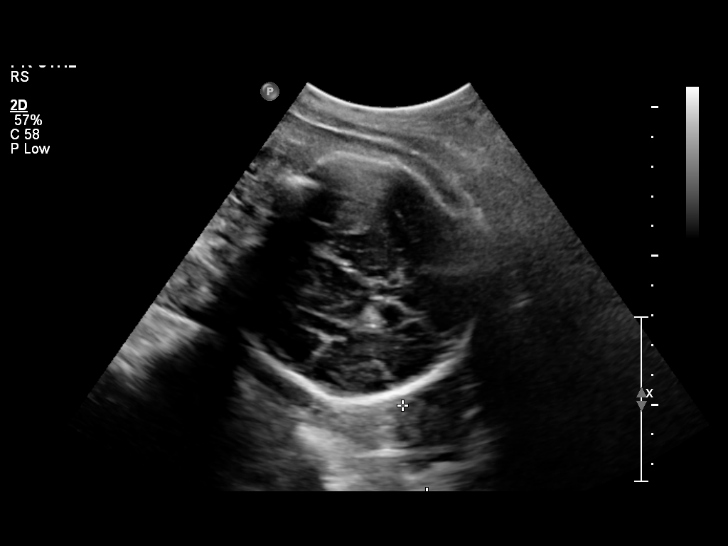
[im 4/29]
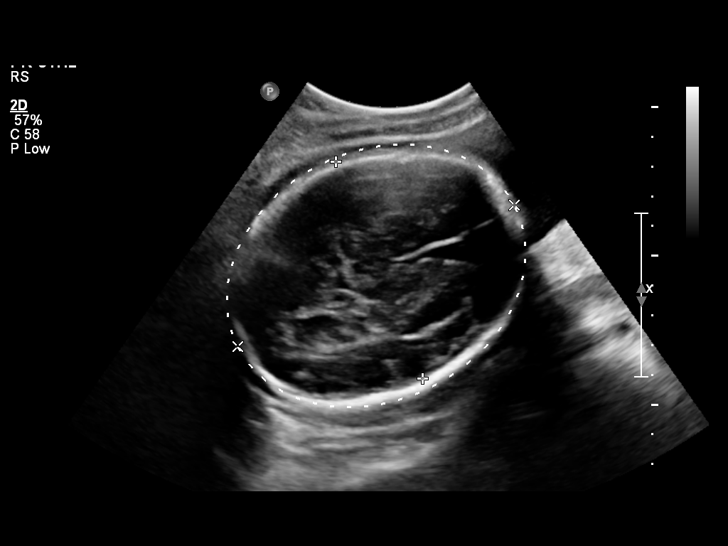
[im 6/29]
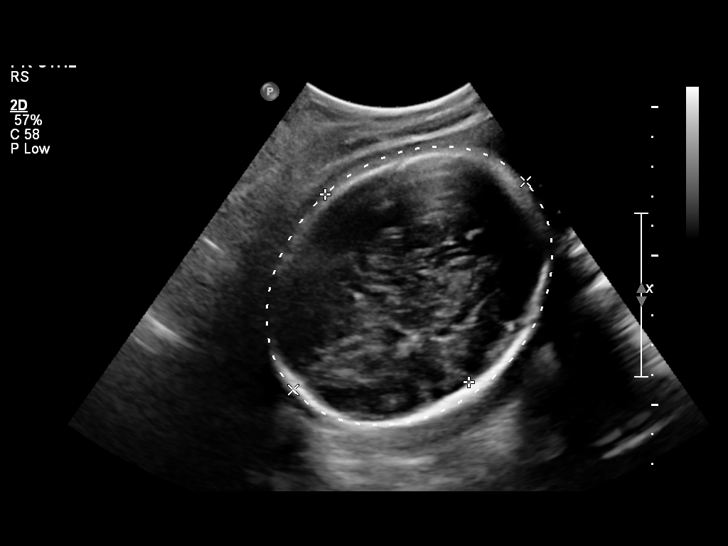
[im 8/29]
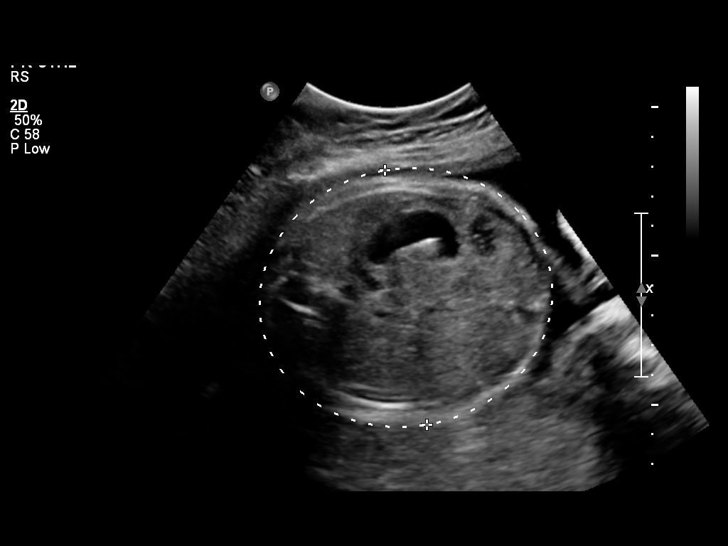
[im 10/29]
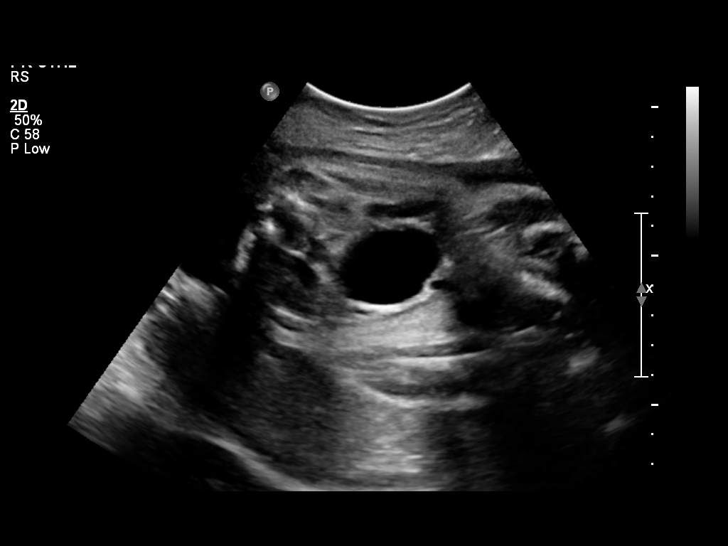
[im 12/29]
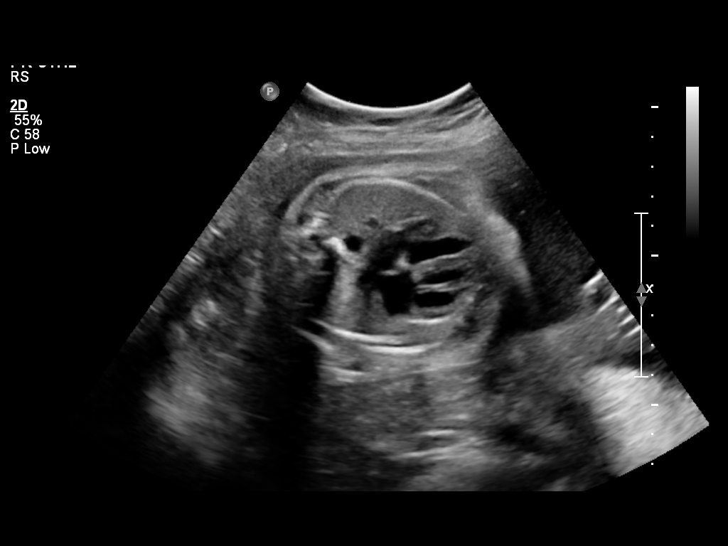
[im 14/29]
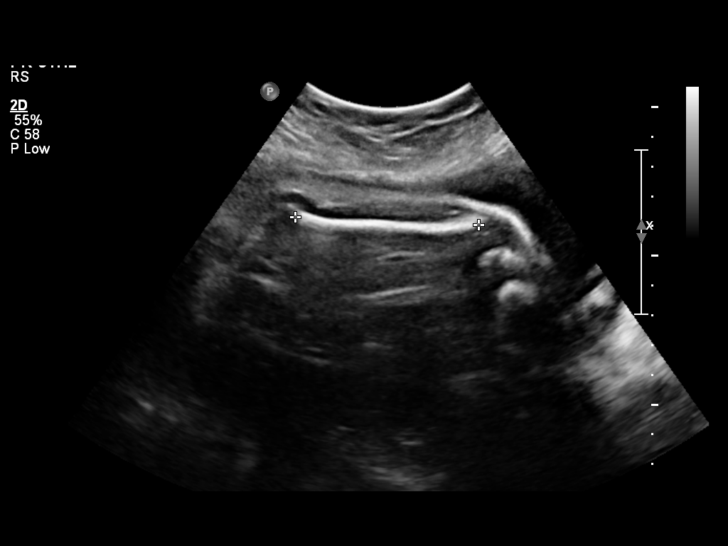
[im 16/29]
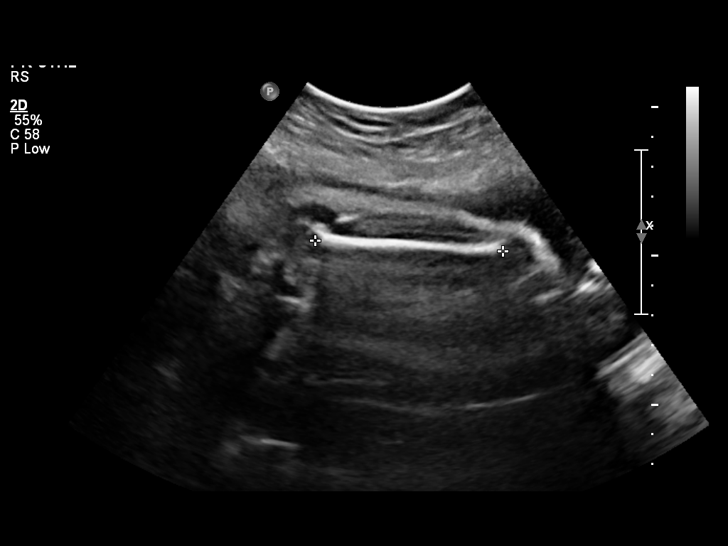
[im 18/29]
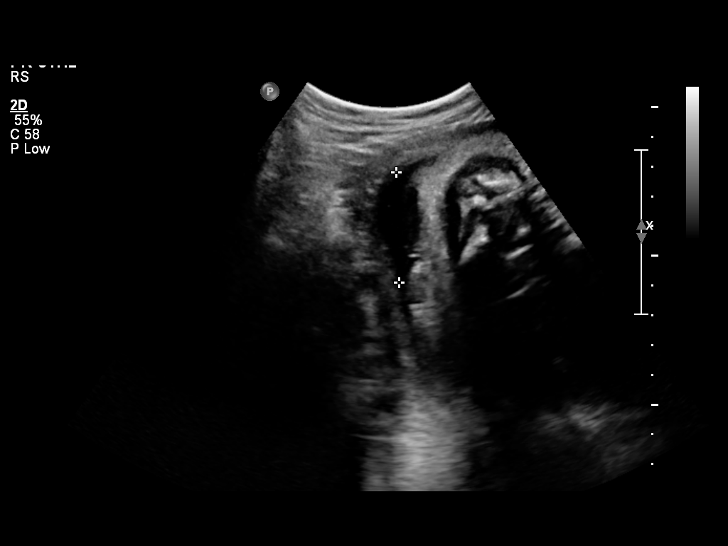
[im 20/29]
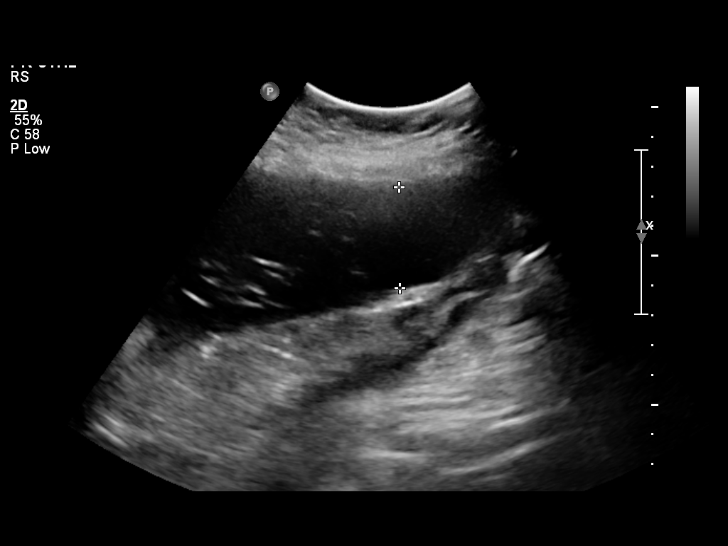
[im 22/29]
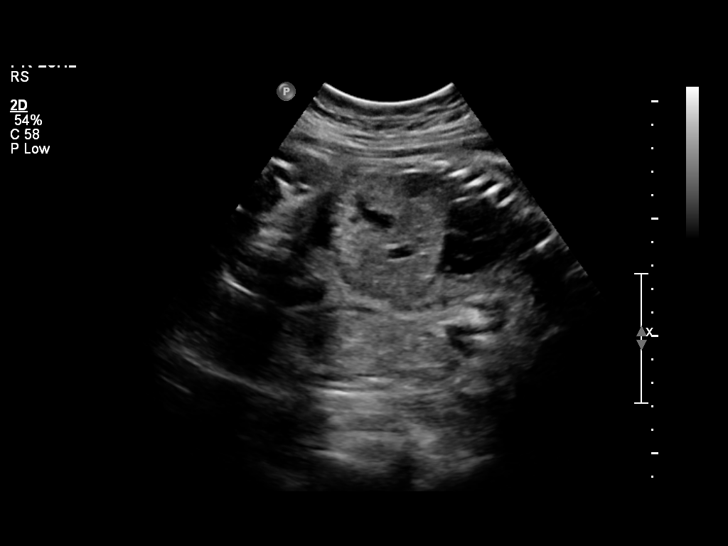
[im 24/29]
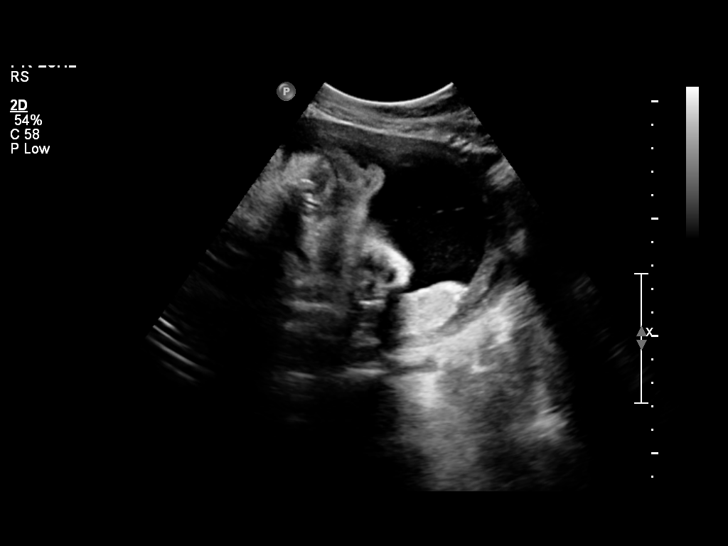
[im 26/29]
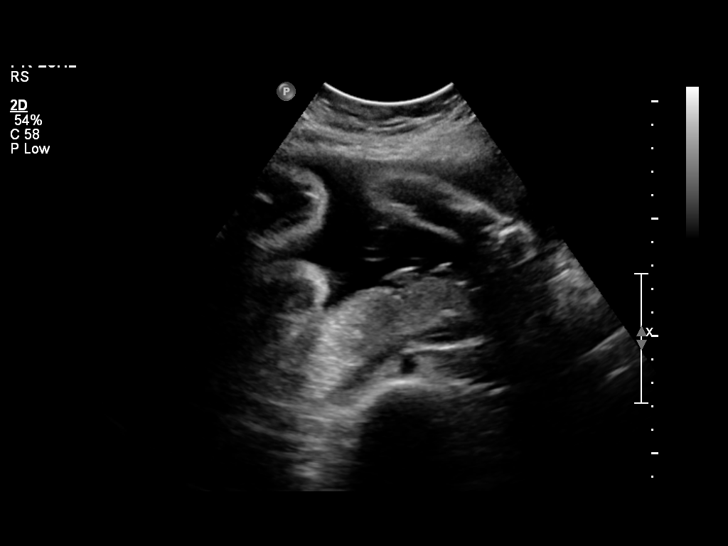
[im 29/29]
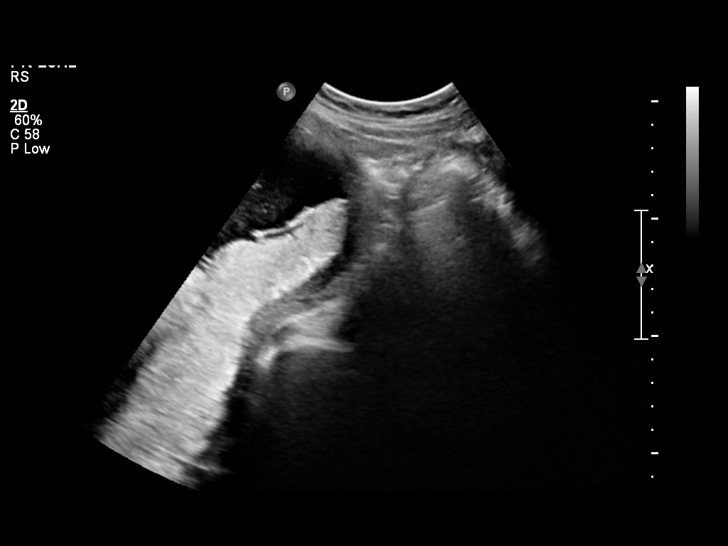

[14 of 28 positions shown; findings below may reference images not displayed]

IMPRESSION: See AS Obstetric US report.

## 2009-03-24 ENCOUNTER — Ambulatory Visit: Payer: Self-pay | Admitting: Obstetrics & Gynecology

## 2009-03-28 ENCOUNTER — Ambulatory Visit: Payer: Self-pay | Admitting: Obstetrics and Gynecology

## 2009-03-31 ENCOUNTER — Ambulatory Visit: Payer: Self-pay | Admitting: Obstetrics & Gynecology

## 2009-04-04 ENCOUNTER — Ambulatory Visit: Payer: Self-pay | Admitting: Obstetrics & Gynecology

## 2009-04-07 ENCOUNTER — Ambulatory Visit: Payer: Self-pay | Admitting: Obstetrics & Gynecology

## 2009-04-11 ENCOUNTER — Ambulatory Visit: Payer: Self-pay | Admitting: Obstetrics & Gynecology

## 2009-04-11 ENCOUNTER — Encounter: Payer: Self-pay | Admitting: Advanced Practice Midwife

## 2009-04-11 LAB — CONVERTED CEMR LAB: Chlamydia, DNA Probe: NEGATIVE

## 2009-04-12 ENCOUNTER — Encounter: Payer: Self-pay | Admitting: Advanced Practice Midwife

## 2009-04-14 ENCOUNTER — Ambulatory Visit: Payer: Self-pay | Admitting: Obstetrics & Gynecology

## 2009-04-21 ENCOUNTER — Ambulatory Visit: Payer: Self-pay | Admitting: Obstetrics & Gynecology

## 2009-04-25 ENCOUNTER — Encounter (INDEPENDENT_AMBULATORY_CARE_PROVIDER_SITE_OTHER): Payer: Self-pay | Admitting: *Deleted

## 2009-04-25 ENCOUNTER — Ambulatory Visit: Payer: Self-pay | Admitting: Obstetrics & Gynecology

## 2009-04-25 ENCOUNTER — Encounter: Admission: RE | Admit: 2009-04-25 | Discharge: 2009-04-25 | Payer: Self-pay | Admitting: Obstetrics & Gynecology

## 2009-04-25 ENCOUNTER — Ambulatory Visit (HOSPITAL_COMMUNITY): Admission: RE | Admit: 2009-04-25 | Discharge: 2009-04-25 | Payer: Self-pay | Admitting: Family Medicine

## 2009-04-25 DIAGNOSIS — F17200 Nicotine dependence, unspecified, uncomplicated: Secondary | ICD-10-CM | POA: Insufficient documentation

## 2009-04-25 IMAGING — US US OB FOLLOW-UP
1 series · 14 of 28 positions shown · non-contrast
Comparison: none

OBSTETRICAL ULTRASOUND:
 This ultrasound exam was performed in the [HOSPITAL] Ultrasound Department.  The OB US report was generated in the AS system, and faxed to the ordering physician.  This report is also available in [HOSPITAL]?s [employer] and in [REDACTED] PACS.

[Series 1: us ob follow up · 0.27mm/px · 14 of 35 slices shown]
[im 2/35]
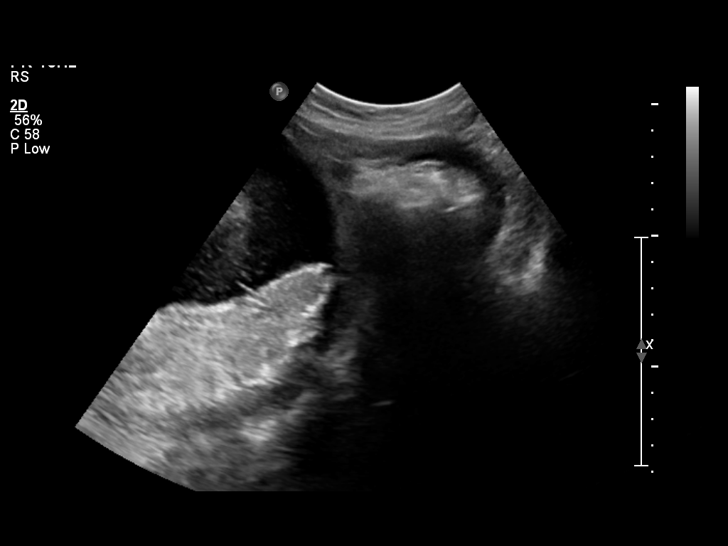
[im 4/35]
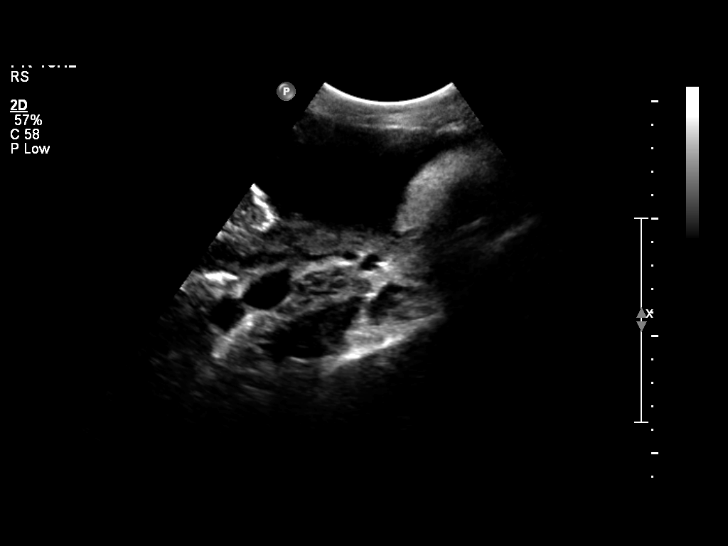
[im 7/35]
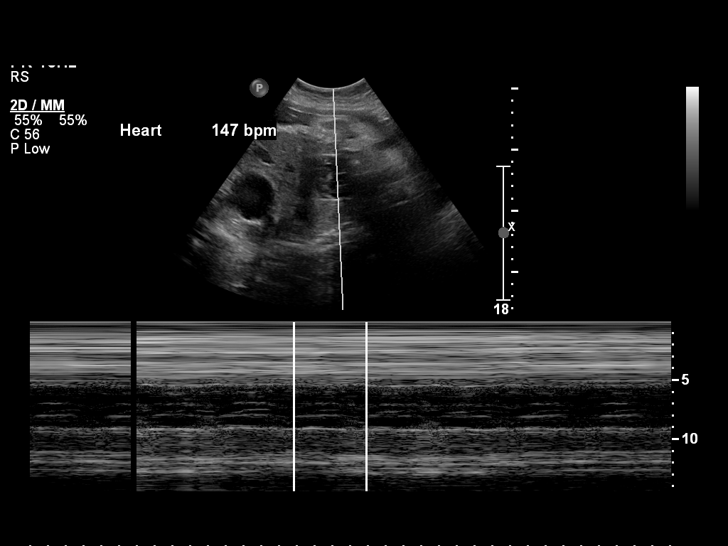
[im 9/35]
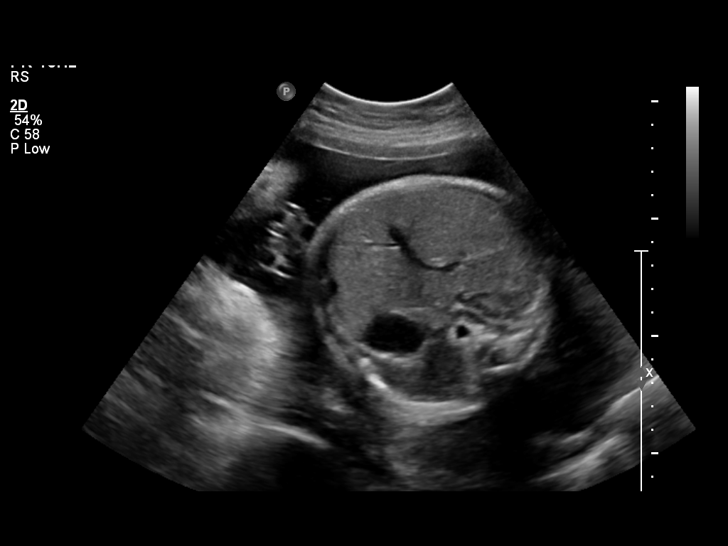
[im 12/35]
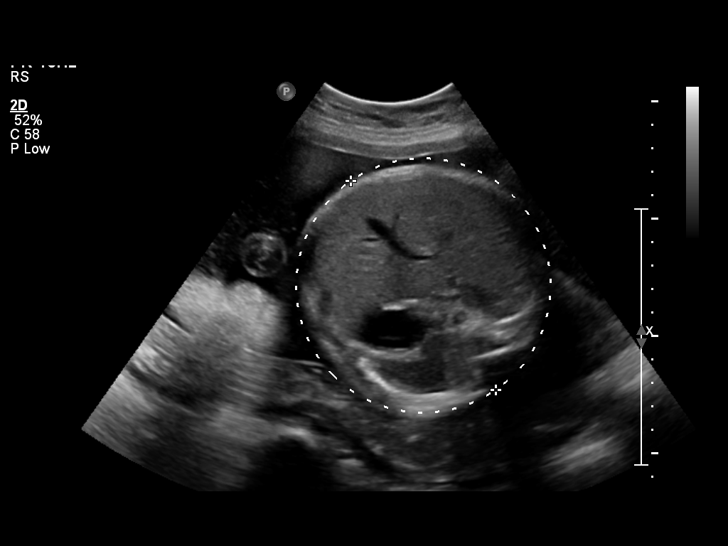
[im 14/35]
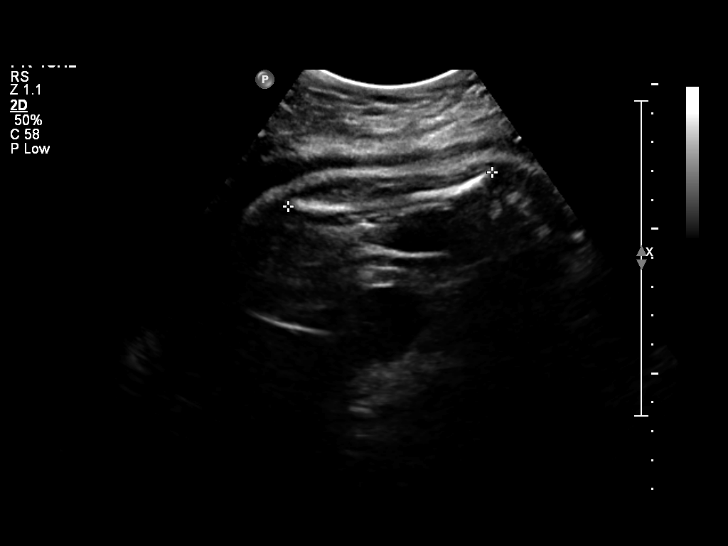
[im 17/35]
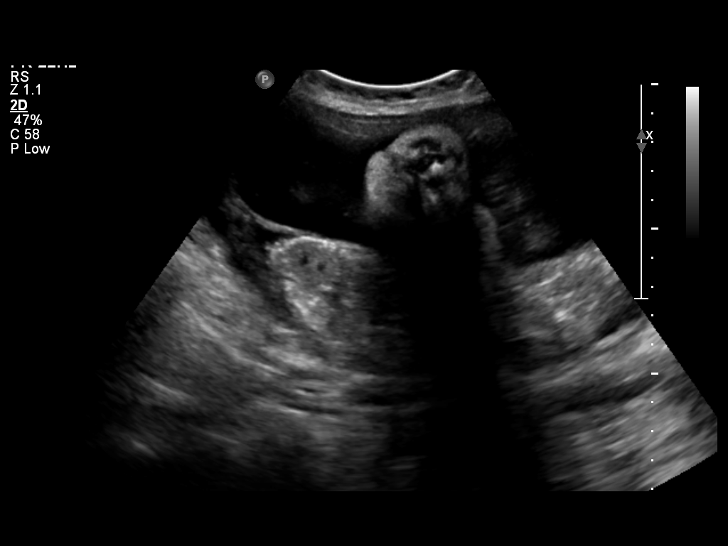
[im 19/35]
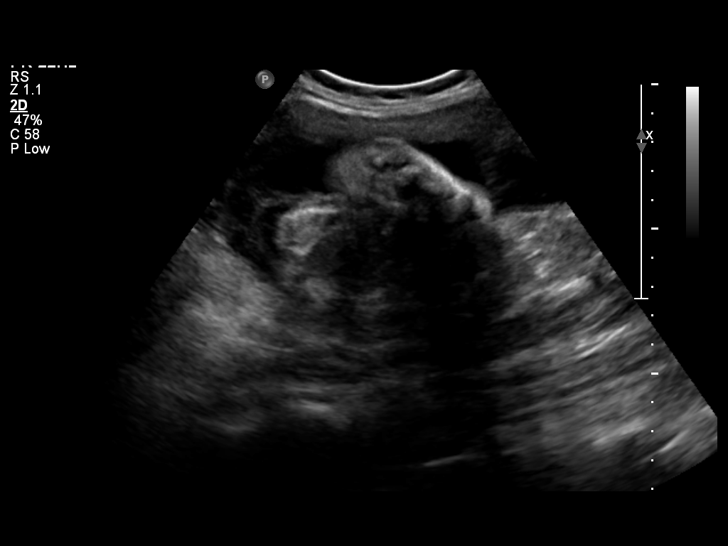
[im 22/35]
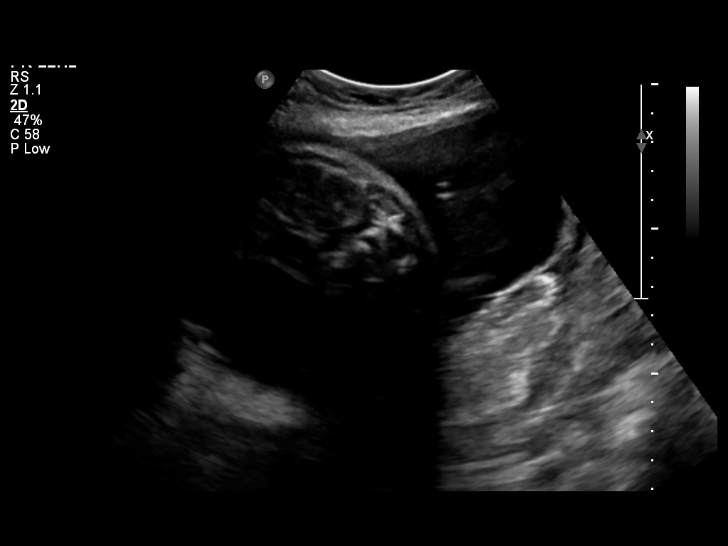
[im 24/35]
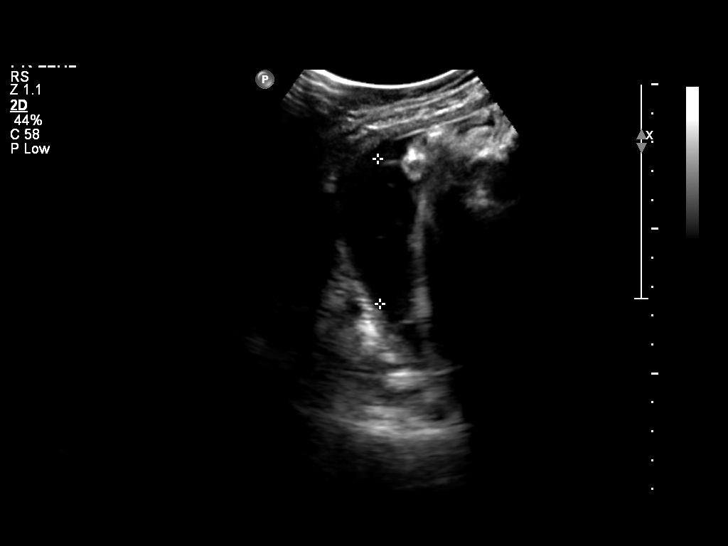
[im 27/35]
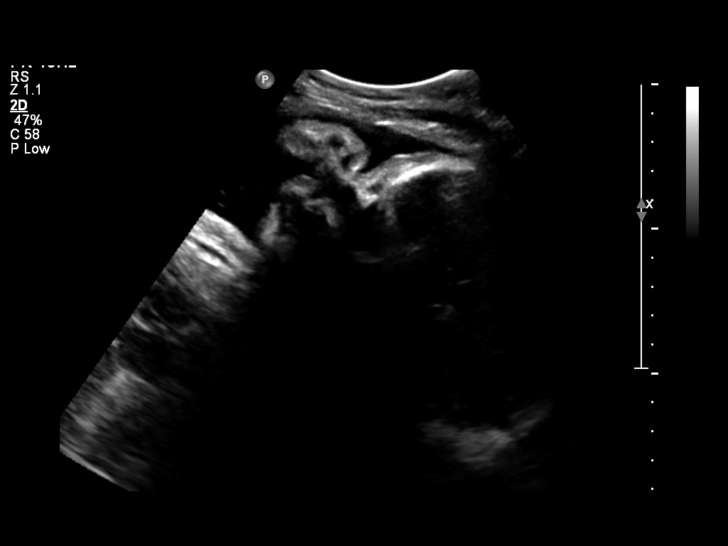
[im 29/35]
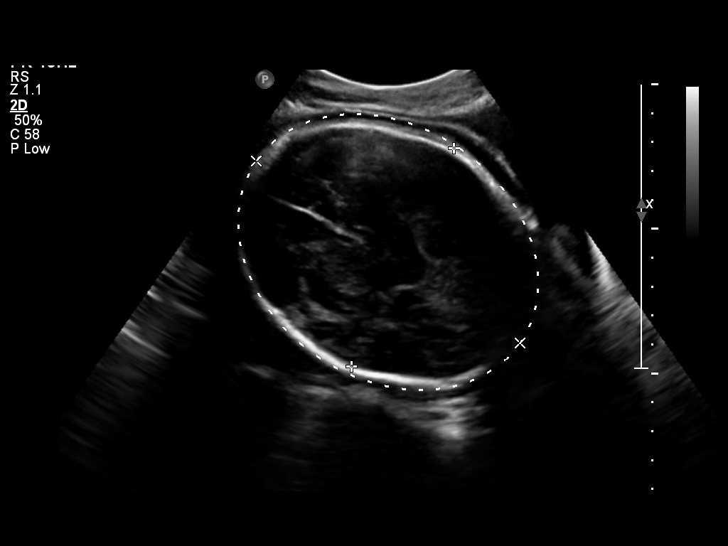
[im 32/35]
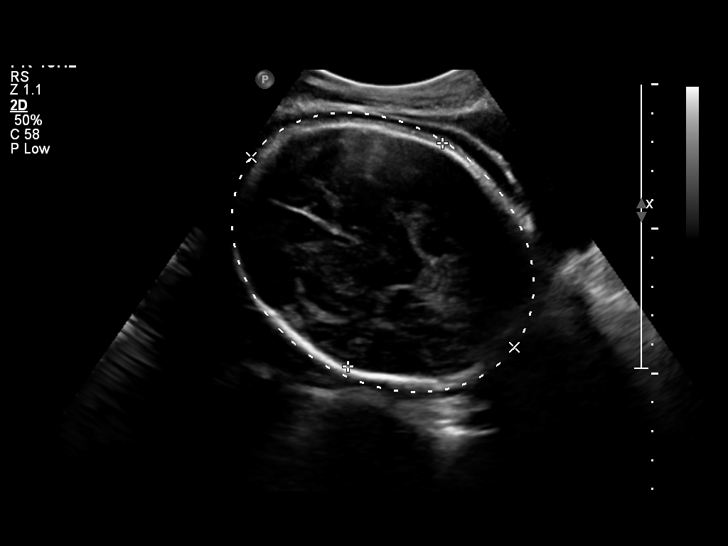
[im 35/35]
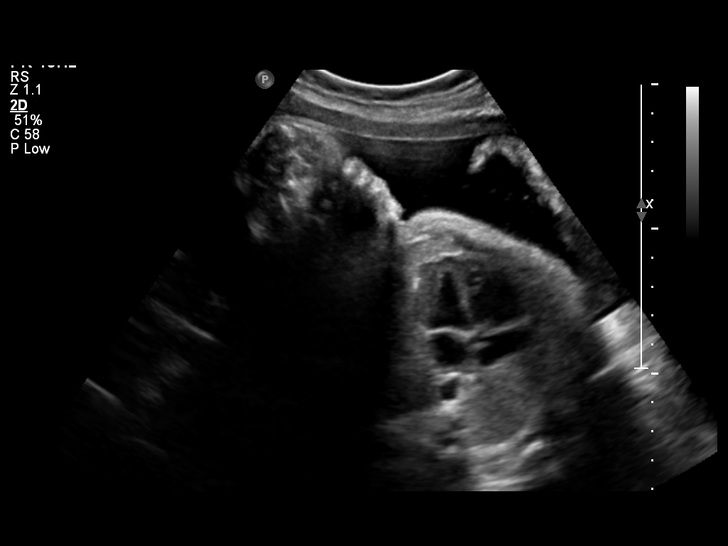

[14 of 28 positions shown; findings below may reference images not displayed]

IMPRESSION: See AS Obstetric US report.

## 2009-04-28 ENCOUNTER — Ambulatory Visit: Payer: Self-pay | Admitting: Obstetrics & Gynecology

## 2009-05-02 ENCOUNTER — Inpatient Hospital Stay (HOSPITAL_COMMUNITY): Admission: AD | Admit: 2009-05-02 | Discharge: 2009-05-05 | Payer: Self-pay | Admitting: Obstetrics & Gynecology

## 2009-05-02 ENCOUNTER — Ambulatory Visit: Payer: Self-pay | Admitting: Obstetrics & Gynecology

## 2009-06-15 ENCOUNTER — Ambulatory Visit: Payer: Self-pay | Admitting: Family Medicine

## 2009-07-28 ENCOUNTER — Encounter: Payer: Self-pay | Admitting: Family Medicine

## 2010-02-28 ENCOUNTER — Encounter: Payer: Self-pay | Admitting: Family Medicine

## 2010-02-28 ENCOUNTER — Ambulatory Visit: Payer: Self-pay | Admitting: Family Medicine

## 2010-02-28 DIAGNOSIS — F4329 Adjustment disorder with other symptoms: Secondary | ICD-10-CM | POA: Insufficient documentation

## 2010-02-28 LAB — CONVERTED CEMR LAB: Beta hcg, urine, semiquantitative: NEGATIVE

## 2010-05-23 NOTE — Miscellaneous (Signed)
Summary: Tobacco Ann Brown  Clinical Lists Changes  Problems: Added new problem of TOBACCO [name] (ICD-305.1) 

## 2010-05-23 NOTE — Assessment & Plan Note (Signed)
Summary: implanon insertion, anxiety, yeast infection   Vital Signs:  Patient profile:   25 year old female Weight:      110.3 pounds Temp:     98.2 degrees F oral Pulse rate:   77 / minute BP sitting:   114 / 73  (left arm) Cuff size:   regular  Vitals Entered By: Loralee Pacas CMA (February 28, 2010 1:50 PM) CC: implanon, anxeity, yeast infection   Primary Care Provider:  Jamie Brookes MD  CC:  implanon, anxeity, and yeast infection.  History of Present Illness: Dominence of hand: Right Placement of Implanon: Left Placement determined using ruler. insertion was 8 cm from medial epicondyle. marks made on skin at site of insertion.  skin was cleaned w/ alcohol, 2 cc of 1% lidocaine w/o epi was inserted under skin.  the skin was prepped using 3 betadine swabs sterile prodedure was used.  The implanon was inserted without complication. 2 Steri strips placed once skin was prepped w/ Benzine tincture Compression bandage was placed Implanon palpated by myself & patient  Anxiety: Pt's uncle just died (he was impailed) and she is having a hard time with it. She feels pressure on her chest and anxeity. Wants to know if there is anything that can help.   Yeast infection: Pt has recurrent yeast infections. She had them during the pregnancy and has had them about every other week since the delivery. Wants to try Diflucan.    Habits & Providers  Alcohol-Tobacco-Diet     Tobacco Status: current  Current Medications (verified): 1)  Advair Diskus 100-50 Mcg/dose Misc (Fluticasone-Salmeterol) .Marland Kitchen.. 1 Puff Once Daily 2)  Ventolin Hfa 108 (90 Base) Mcg/act Aers (Albuterol Sulfate) .... 2 Puffs 4 Times Daily 3)  Zantac 150 Mg Tabs (Ranitidine Hcl) .... Take One Tablet Twice Daily As Needed For Reflux 4)  Veetids 250 Mg Tabs (Penicillin V Potassium) .Marland Kitchen.. 1 By Mouth Three Times A Day X 10 Days 5)  Afrin Nasal Spray 0.05 % Soln (Oxymetazoline Hcl) .... 2 Sprays Each Nostril Two Times A  Day As Needed Congestion or Bleeding 6)  Fluconazole 150 Mg Tabs (Fluconazole) .... Take 1 Pill Today and Take A 2nd If Not Better in 4 Days.  Allergies (verified): No Known Drug Allergies  Review of Systems        vitals reviewed and pertinent negatives and positives seen in HPI   Physical Exam  General:  Well-developed,well-nourished,in no acute distress; alert,appropriate and cooperative throughout examination Extremities:  placed in left arm. 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 She is having some feelings of chest pressure and is crying a lot. Feeling very anxious. Given Rx for Ativan 1 mg #30.   Orders: [facility]- Est Level  3 (09811)  Problem # 3:  VAGINAL DISCHARGE (ICD-623.5) Assessment: Unchanged Pt has a h/o vaginal yeast infections that have never really cleared since the pregnancy. Pt given new Rx for Diflucan.   Orders: [facility]- Est Level  3 (91478)  Complete Medication List: 1)  Advair Diskus 100-50 Mcg/dose Misc (Fluticasone-salmeterol) .Marland Kitchen.. 1 puff once daily 2)  Ventolin Hfa 108 (90 Base) Mcg/act Aers (Albuterol sulfate) .... 2 puffs 4 times daily 3)  Zantac 150 Mg Tabs (Ranitidine hcl) .... Take  one tablet twice daily as needed for reflux 4)  Veetids 250 Mg Tabs (Penicillin v potassium) .Marland Kitchen.. 1 by mouth three times a day x 10 days 5)  Afrin Nasal Spray 0.05 % Soln (Oxymetazoline hcl) .... 2 sprays each nostril two times a day as needed congestion or bleeding 6)  Fluconazole 150 Mg Tabs (Fluconazole) .... Take 1 pill today and take a 2nd if not better in 4 days. 7)  Ativan 1 Mg Tabs (Lorazepam) .... Take 1 pill bid for anxiety  Patient Instructions: 1)  Take motrin for  the pain,  2)  Take Ativan for the feeling of overwhelming anxiety.  Prescriptions: ATIVAN 1 MG TABS (LORAZEPAM) take 1 pill BId for anxiety  #30 x 0   Entered and Authorized by:   Jamie Brookes MD   Signed by:   Jamie Brookes MD on 02/28/2010   Method used:   Handwritten   RxID:   [id number] FLUCONAZOLE 150 MG TABS (FLUCONAZOLE) take 1 pill today and take a 2nd if not better in 4 days.  #2 x 0   Entered and Authorized by:   Jamie Brookes MD   Signed by:   Jamie Brookes MD on 02/28/2010   Method used:   [facility]        [facility] [phone]* (retail)       [address]       [place], [place]  [id number]       Ph: [phone] or [phone]       Fax: [phone]   RxID:   [id number]    Orders Added: 1)  U Preg-[facility] [81025] 2)   Insertion implantable contraceptive capsules   [11975] 3)  [facility]- Est Level  3 [60109]    Laboratory Results   Urine Tests  Date/Time Received: February 28, 2010 1:55 PM  Date/Time Reported: February 28, 2010 2:04 PM     Urine HCG: negative Comments: ...........test performed by...........Marland KitchenTerese Door, CMA

## 2010-05-23 NOTE — Assessment & Plan Note (Signed)
Summary: postpartum ck,tcb   Vital Signs:  Patient profile:   25 year old female Height:      61 inches Weight:      117 pounds BMI:     22.19 BSA:     1.51 Temp:     98.2 degrees F Pulse rate:   68 / minute BP sitting:   104 / 69  Vitals Entered By: Jone Baseman CMA (June 15, 2009 10:49 AM) CC: Post Partum check Is Patient Diabetic? No Pain Assessment Patient in pain? yes     Location: stomach Intensity: 3 Type: soreness   Primary Care Provider:  Jamie Brookes MD  CC:  Post Partum check.  History of Present Illness: Pregnancy complications:GDM, induced at 39 wks for glucose monitory Delivery type:Vacuum assisted, 2nd degree laceration Vaginal bleeding: resolved shortly after delivery with no further problems Menses: none Contraception: Sprintec Vaginal discharge: none Abdominal pain: some generalized soreness Fever/Chills: no Feeding: bottle Sleep: not sleeping through the night yet Mood:normal Return to school/work: no Bowel Movements: regular and normal  Pt is doing well. Discussed contraception. She wants to learn more about the Implanon. Told pt to call [facility] and find out it her insurance covers the implanon.   Habits & Providers  Alcohol-Tobacco-Diet     Tobacco Status: current     Tobacco Counseling: to quit use of tobacco products     Cigarette Packs/Day: 1.0  Allergies: No Known Drug Allergies  Social History: Packs/Day:  1.0  Physical Exam  General:  Well-developed,well-nourished,in no acute distress; alert,appropriate and cooperative throughout examination Abdomen:  diffusely tender to palpation, no masses or abnormalities. Small sebaceous cyst still present above umilicus, not infected.  Genitalia:  normal appearing vagina, sutures still present at 1 o'clock.  Psych:  Cognition and judgment appear intact. Alert and cooperative with normal attention span and concentration. 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 Plan to get her to come back and have a 2 Hr GTT with 75 mg load. She is doing well in her post-partum state. She is due for a repeat PAP smear in Aug 2011.   [facility]  Problem # 2:  CONTRACEPTIVE MANAGEMENT (ICD-V25.09) Discussed options: pills, patches, Nuvaring, Implanon and IUD. Pt says she did not like the patch and can't have the IUD. She is thinking about using the implanon. She is going to get more info and will let me know, but will cont to use Sprintec until she decides  Orders: Postpartum visit- [facility] (82956)  Complete Medication List: 1)  Advair Diskus 100-50 Mcg/dose Misc (Fluticasone-salmeterol) .Marland Kitchen.. 1 puff once daily 2)  Ventolin Hfa 108 (90 Base) Mcg/act Aers (Albuterol sulfate) .... 2 puffs 4 times daily 3)  Zantac 150 Mg Tabs (Ranitidine hcl) .... Take one tablet twice daily as needed for reflux 4)  Veetids 250 Mg Tabs (Penicillin v potassium) .Marland Kitchen.. 1 by mouth three times a day x 10 days 5)  Afrin Nasal Spray 0.05 % Soln (Oxymetazoline hcl) .... 2 sprays each nostril two times a day as needed congestion or bleeding 6)  Sprintec 28 0.25-35 Mg-mcg Tabs (Norgestimate-eth estradiol) .... Take one daily  Other Orders: Future Orders: Miscellaneous Lab Charge-[facility] [phone]) ... 06/07/2010  Patient Instructions: 1)  You can call [facility] [phone]) to find out if you insurance covers the Implanon. If  you decide you want this I can give you a referral.   2)  Use the Sprintec until you decide what kind of other contraception you want.  Prescriptions: SPRINTEC 28 0.25-35 MG-MCG TABS (NORGESTIMATE-ETH ESTRADIOL) take one daily  #31 x 6   Entered and Authorized by:   Jamie Brookes MD   Signed by:   Jamie Brookes MD on 06/15/2009   Method used:    Electronically to        [facility] (retail)       [address]       [place], [place]  [address]       Ph: [phone]       Fax: [phone]   RxID:   [id number]

## 2010-05-23 NOTE — Miscellaneous (Signed)
Summary: Consent: Implanon insertion  Consent: Implanon insertion   Imported By: Knox Royalty 03/01/2010 11:19:06  _____________________________________________________________________  External Attachment:    Type:   Image     Comment:   External Document

## 2010-05-23 NOTE — Consult Note (Signed)
Summary: [facility]- Surgery  [facility]- Surgery   Imported By: De Nurse 08/01/2009 15:39:13  _____________________________________________________________________  External Attachment:    Type:   Image     Comment:   External Document

## 2010-07-09 LAB — CBC
HCT: 30.1 % — ABNORMAL LOW (ref 36.0–46.0)
Hemoglobin: 10.4 g/dL — ABNORMAL LOW (ref 12.0–15.0)
Hemoglobin: 12.2 g/dL (ref 12.0–15.0)
MCHC: 34.4 g/dL (ref 30.0–36.0)
MCHC: 34.5 g/dL (ref 30.0–36.0)
MCV: 106.1 fL — ABNORMAL HIGH (ref 78.0–100.0)
Platelets: 189 10*3/uL (ref 150–400)
RBC: 3.35 MIL/uL — ABNORMAL LOW (ref 3.87–5.11)
RDW: 13.3 % (ref 11.5–15.5)
WBC: 15.2 10*3/uL — ABNORMAL HIGH (ref 4.0–10.5)

## 2010-07-09 LAB — GLUCOSE, CAPILLARY
Glucose-Capillary: 56 mg/dL — ABNORMAL LOW (ref 70–99)
Glucose-Capillary: 67 mg/dL — ABNORMAL LOW (ref 70–99)
Glucose-Capillary: 74 mg/dL (ref 70–99)
Glucose-Capillary: 83 mg/dL (ref 70–99)
Glucose-Capillary: 88 mg/dL (ref 70–99)

## 2010-07-09 LAB — POCT URINALYSIS DIP (DEVICE)
Bilirubin Urine: NEGATIVE
Glucose, UA: NEGATIVE mg/dL
Hgb urine dipstick: NEGATIVE
Nitrite: NEGATIVE

## 2010-07-09 LAB — RPR: RPR Ser Ql: NONREACTIVE

## 2010-07-24 LAB — POCT URINALYSIS DIP (DEVICE)
Glucose, UA: NEGATIVE mg/dL
Nitrite: NEGATIVE
Protein, ur: NEGATIVE mg/dL
Protein, ur: NEGATIVE mg/dL
Specific Gravity, Urine: <=1.005 (ref 1.005–1.030)
Urobilinogen, UA: 0.2 mg/dL (ref 0.0–1.0)
Urobilinogen, UA: 0.2 mg/dL (ref 0.0–1.0)
pH: 6.5 (ref 5.0–8.0)

## 2010-07-25 LAB — POCT URINALYSIS DIP (DEVICE)
Bilirubin Urine: NEGATIVE
Bilirubin Urine: NEGATIVE
Glucose, UA: NEGATIVE mg/dL
Glucose, UA: NEGATIVE mg/dL
Glucose, UA: NEGATIVE mg/dL
Hgb urine dipstick: NEGATIVE
Nitrite: NEGATIVE
Nitrite: NEGATIVE
Nitrite: NEGATIVE
Protein, ur: NEGATIVE mg/dL
Specific Gravity, Urine: 1.01 (ref 1.005–1.030)
Urobilinogen, UA: 0.2 mg/dL (ref 0.0–1.0)
Urobilinogen, UA: 0.2 mg/dL (ref 0.0–1.0)
Urobilinogen, UA: 0.2 mg/dL (ref 0.0–1.0)
pH: 7 (ref 5.0–8.0)

## 2010-07-26 LAB — POCT URINALYSIS DIP (DEVICE)
Bilirubin Urine: NEGATIVE
Glucose, UA: NEGATIVE mg/dL
Hgb urine dipstick: NEGATIVE
Nitrite: NEGATIVE
Urobilinogen, UA: 0.2 mg/dL (ref 0.0–1.0)

## 2010-07-26 LAB — GLUCOSE, CAPILLARY: Glucose-Capillary: 100 mg/dL — ABNORMAL HIGH (ref 70–99)

## 2010-07-27 LAB — GLUCOSE, CAPILLARY: Glucose-Capillary: 145 mg/dL — ABNORMAL HIGH (ref 70–99)

## 2010-08-07 LAB — DIFFERENTIAL
Basophils Absolute: 0 10*3/uL (ref 0.0–0.1)
Basophils Relative: 1 % (ref 0–1)
Lymphocytes Relative: 27 % (ref 12–46)
Neutro Abs: 5.5 10*3/uL (ref 1.7–7.7)
Neutrophils Relative %: 65 % (ref 43–77)

## 2010-08-07 LAB — URINALYSIS, ROUTINE W REFLEX MICROSCOPIC
Bilirubin Urine: NEGATIVE
Hgb urine dipstick: NEGATIVE
Ketones, ur: NEGATIVE mg/dL
Nitrite: NEGATIVE
Specific Gravity, Urine: 1.018 (ref 1.005–1.030)
Urobilinogen, UA: 1 mg/dL (ref 0.0–1.0)

## 2010-08-07 LAB — COMPREHENSIVE METABOLIC PANEL
Albumin: 3.7 g/dL (ref 3.5–5.2)
Alkaline Phosphatase: 62 U/L (ref 39–117)
BUN: 8 mg/dL (ref 6–23)
CO2: 26 mEq/L (ref 19–32)
Chloride: 109 mEq/L (ref 96–112)
Creatinine, Ser: 0.57 mg/dL (ref 0.4–1.2)
GFR calc non Af Amer: >60 mL/min (ref >60)
Glucose, Bld: 119 mg/dL — ABNORMAL HIGH (ref 70–99)
Potassium: 3.6 mEq/L (ref 3.5–5.1)
Total Bilirubin: 0.3 mg/dL (ref 0.3–1.2)

## 2010-08-07 LAB — CBC
HCT: 41.8 % (ref 36.0–46.0)
Hemoglobin: 14.1 g/dL (ref 12.0–15.0)
MCV: 103.3 fL — ABNORMAL HIGH (ref 78.0–100.0)
Platelets: 188 10*3/uL (ref 150–400)
WBC: 8.4 10*3/uL (ref 4.0–10.5)

## 2010-08-07 LAB — POCT PREGNANCY, URINE: Preg Test, Ur: NEGATIVE

## 2010-08-07 LAB — LIPASE, BLOOD: Lipase: 19 U/L (ref 11–59)

## 2010-09-19 ENCOUNTER — Ambulatory Visit: Payer: Self-pay | Admitting: Family Medicine

## 2010-12-12 ENCOUNTER — Encounter: Payer: Self-pay | Admitting: Sports Medicine

## 2010-12-12 ENCOUNTER — Ambulatory Visit (INDEPENDENT_AMBULATORY_CARE_PROVIDER_SITE_OTHER): Payer: Medicaid Other | Admitting: Sports Medicine

## 2010-12-12 DIAGNOSIS — N898 Other specified noninflammatory disorders of vagina: Secondary | ICD-10-CM

## 2010-12-12 DIAGNOSIS — L989 Disorder of the skin and subcutaneous tissue, unspecified: Secondary | ICD-10-CM

## 2010-12-12 DIAGNOSIS — J45909 Unspecified asthma, uncomplicated: Secondary | ICD-10-CM

## 2010-12-12 DIAGNOSIS — N939 Abnormal uterine and vaginal bleeding, unspecified: Secondary | ICD-10-CM | POA: Insufficient documentation

## 2010-12-12 DIAGNOSIS — F17200 Nicotine dependence, unspecified, uncomplicated: Secondary | ICD-10-CM

## 2010-12-12 DIAGNOSIS — K219 Gastro-esophageal reflux disease without esophagitis: Secondary | ICD-10-CM

## 2010-12-12 LAB — TSH: TSH: 0.88 u[IU]/mL (ref 0.350–4.500)

## 2010-12-12 LAB — CBC
Hemoglobin: 15.2 g/dL — ABNORMAL HIGH (ref 12.0–15.0)
MCH: 34.4 pg — ABNORMAL HIGH (ref 26.0–34.0)
Platelets: 246 10*3/uL (ref 150–400)
RBC: 4.42 MIL/uL (ref 3.87–5.11)

## 2010-12-12 MED ORDER — NORGESTIMATE-ETH ESTRADIOL 0.25-35 MG-MCG PO TABS: ORAL_TABLET | ORAL | Status: DC

## 2010-12-12 MED ORDER — OMEPRAZOLE 20 MG PO CPDR: 20.0000 mg | DELAYED_RELEASE_CAPSULE | Freq: Every day | ORAL | Status: AC

## 2010-12-12 MED ORDER — ALBUTEROL SULFATE HFA 108 (90 BASE) MCG/ACT IN AERS: 2.0000 | INHALATION_SPRAY | Freq: Four times a day (QID) | RESPIRATORY_TRACT | Status: DC | PRN

## 2010-12-12 MED ORDER — FLUTICASONE-SALMETEROL 100-50 MCG/DOSE IN AEPB: 1.0000 | INHALATION_SPRAY | Freq: Two times a day (BID) | RESPIRATORY_TRACT | Status: DC

## 2010-12-12 NOTE — Assessment & Plan Note (Signed)
Will try 1 month of sprintec with bid dosing until bleeding stops [ ]  CBC and TSH

## 2010-12-12 NOTE — Assessment & Plan Note (Signed)
Experiences reflux symptoms most mornings; has not tried PPI, will trial 1 month of Omeprazole Instructed to avoid irritating foods and drinks such as coffee and teas

## 2010-12-12 NOTE — Assessment & Plan Note (Signed)
Dropped a cedar board on her foot; well healed lesion, NV intact, good strength, no erythema. Improving on her own  If any signs of infection consider retained piece of cedar but do not suspect this at this time.  No imaging indicated.

## 2010-12-12 NOTE — Patient Instructions (Addendum)
It was nice meeting you.  We have sent in the prescription for the birth control pills to help with your bleeding. I have refilled your asthma meds I have sent in for a 1 month supply of medication for you reflux.  I will call if there are any abnormalities on your lab work today.  When ever you want to talk more about quitting smoking please call for an appointment.  Also look into [facility] for more information.  Follow up as needed

## 2010-12-12 NOTE — Progress Notes (Signed)
[place] reports persistent bleeding following her Implanon device.  She usually has 2-3 days without bleeding in a month otherwise dose have bleeding that ranges from spotting to heavier than spotting on occasion.  No profuse bleeding. She also needs a refill of her Asthma Meds and reports considering quitting smoking. She has had persistent reflux sx especially worse in the morning following a cup of coffee/tea on an empty stomach. Also reports dropping a board on her foot about 2 weeks ago with some persistent pain although continues to improve daily and does not cause her much discomfort. Gait unaffected   OBJECTIVE: General: Young Adult Female, in exam room, no acute distress HNEENT:  Atraumatic, Chestnut, EOMi, no scleral icterus Heart: RRR, S1/S2, no murmur Lungs: CTA B Extremities: L foot with ~1cm lesion over dorsal aspect of 5th metarsal; mild tenderness, no surrounding erythema, well healed lesion.  neurovascuarlly in tact. Strength 5+/5.

## 2011-01-08 ENCOUNTER — Telehealth: Payer: Self-pay | Admitting: Sports Medicine

## 2011-01-08 NOTE — Telephone Encounter (Signed)
Needs Prior Approval for her Adviar inhaler.  Medicaid will not refill without [facility] [address]

## 2011-01-10 NOTE — Telephone Encounter (Signed)
Form placed in Intern To Do Box for Dr. Durene Cal.

## 2011-01-10 NOTE — Telephone Encounter (Signed)
Completed form and placed in to fax bin.

## 2011-01-12 NOTE — Telephone Encounter (Signed)
Approval received for Advair . Pharmacy notified.

## 2011-02-01 ENCOUNTER — Emergency Department (HOSPITAL_COMMUNITY)
Admission: EM | Admit: 2011-02-01 | Discharge: 2011-02-01 | Disposition: A | Discharge status: Home / Self Care | Payer: Medicaid Other | Attending: Emergency Medicine | Admitting: Emergency Medicine

## 2011-02-01 ENCOUNTER — Emergency Department (HOSPITAL_COMMUNITY): Payer: Medicaid Other

## 2011-02-01 DIAGNOSIS — J45909 Unspecified asthma, uncomplicated: Secondary | ICD-10-CM | POA: Insufficient documentation

## 2011-02-01 DIAGNOSIS — E063 Autoimmune thyroiditis: Secondary | ICD-10-CM | POA: Insufficient documentation

## 2011-02-01 DIAGNOSIS — I7300 Raynaud's syndrome without gangrene: Secondary | ICD-10-CM | POA: Insufficient documentation

## 2011-02-01 DIAGNOSIS — IMO0002 Reserved for concepts with insufficient information to code with codable children: Secondary | ICD-10-CM | POA: Insufficient documentation

## 2011-02-01 DIAGNOSIS — M79609 Pain in unspecified limb: Secondary | ICD-10-CM | POA: Insufficient documentation

## 2011-02-01 DIAGNOSIS — K219 Gastro-esophageal reflux disease without esophagitis: Secondary | ICD-10-CM | POA: Insufficient documentation

## 2011-02-01 DIAGNOSIS — S90129A Contusion of unspecified lesser toe(s) without damage to nail, initial encounter: Secondary | ICD-10-CM | POA: Insufficient documentation

## 2011-02-01 DIAGNOSIS — Z9889 Other specified postprocedural states: Secondary | ICD-10-CM | POA: Insufficient documentation

## 2011-02-01 DIAGNOSIS — Y92009 Unspecified place in unspecified non-institutional (private) residence as the place of occurrence of the external cause: Secondary | ICD-10-CM | POA: Insufficient documentation

## 2011-02-01 IMAGING — CR DG TOE GREAT 2+V*R*
3 series · 3 of 3 positions shown · non-contrast
Comparison: None

CLINICAL DATA: Pain and swelling right great toe, trauma

RIGHT TOE - 2+ VIEW

[x toes ap right]
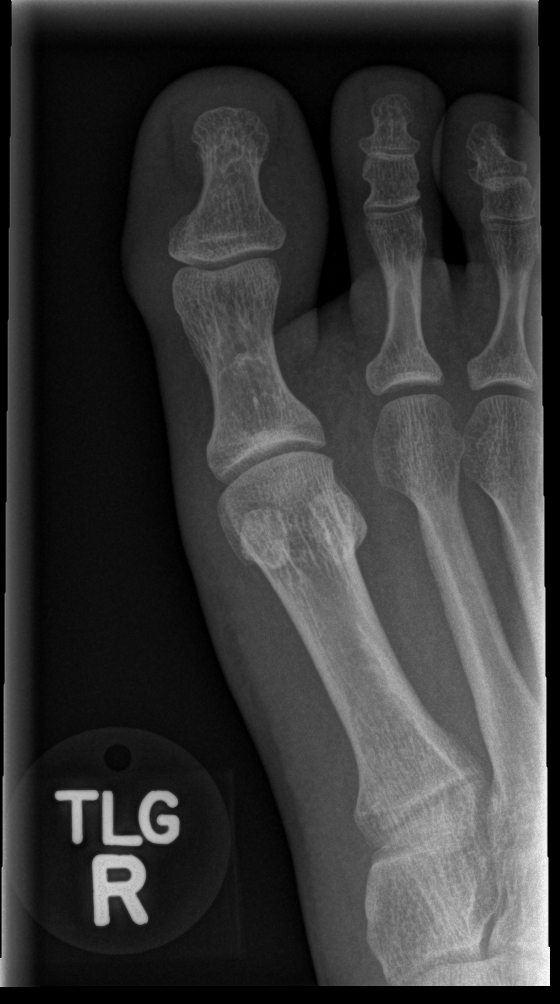

[x toes obl right]
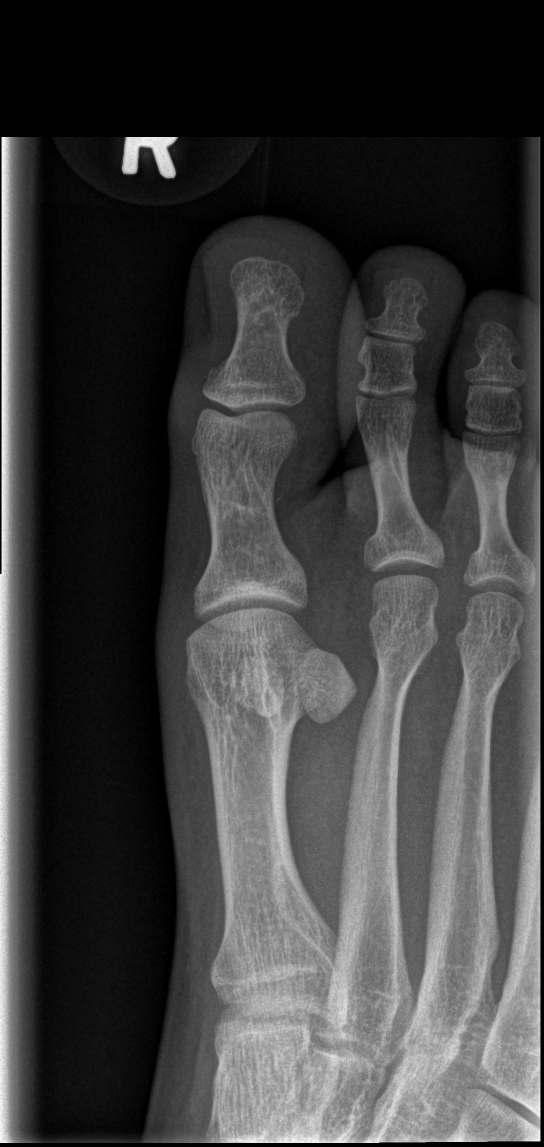

[x toes lat right]
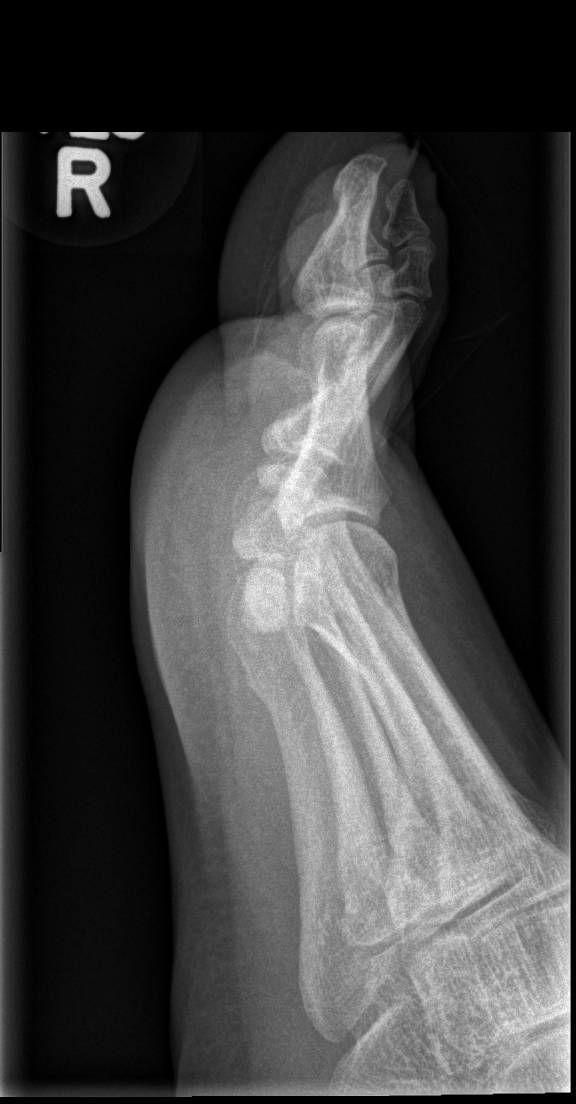

[3 of 3 positions shown; findings below may reference images not displayed]

FINDINGS: Bone mineralization normal.
Joint spaces preserved.
No fracture, dislocation, or bone destruction.
IMPRESSION: No acute osseous abnormalities.

## 2011-03-08 ENCOUNTER — Ambulatory Visit: Payer: Medicaid Other | Admitting: Family Medicine

## 2011-04-26 ENCOUNTER — Encounter: Payer: Self-pay | Admitting: Sports Medicine

## 2011-04-26 ENCOUNTER — Ambulatory Visit (INDEPENDENT_AMBULATORY_CARE_PROVIDER_SITE_OTHER): Payer: Medicaid Other | Admitting: Sports Medicine

## 2011-04-26 VITALS — BP 105/68 | HR 80 | Temp 97.8°F | Ht 61.0 in | Wt 110.6 lb

## 2011-04-26 DIAGNOSIS — N898 Other specified noninflammatory disorders of vagina: Secondary | ICD-10-CM

## 2011-04-26 DIAGNOSIS — F17200 Nicotine dependence, unspecified, uncomplicated: Secondary | ICD-10-CM

## 2011-04-26 DIAGNOSIS — N939 Abnormal uterine and vaginal bleeding, unspecified: Secondary | ICD-10-CM

## 2011-04-26 MED ORDER — DOXYCYCLINE HYCLATE 100 MG PO TABS: 100.0000 mg | ORAL_TABLET | Freq: Every day | ORAL | Status: AC

## 2011-04-26 MED ORDER — NORGESTIMATE-ETH ESTRADIOL 0.25-35 MG-MCG PO TABS: ORAL_TABLET | ORAL | Status: DC

## 2011-04-26 MED ORDER — NAPROXEN 500 MG PO TABS: 500.0000 mg | ORAL_TABLET | Freq: Two times a day (BID) | ORAL | Status: AC | PRN

## 2011-04-26 NOTE — Patient Instructions (Signed)
I have sent in another prescription for sprintec.  Take 1 pill twice daily until bleeding stops then finish the package by taking 1 pill daily.  I have also sent in doxycycline to try to help with your bleeding as well; take 1 pill daily for 14 days.  The last prescription I have sent in is for Naproxen Albertine Grates) take this to help with both decreasing your bleeding and helping your cramping.    If in 6 weeks you are not better we will remove the Implanon and discuss other options for birth control.   Lets plan on seeing each other in 6 weeks unless you need [place] before then!

## 2011-04-28 NOTE — Progress Notes (Signed)
Patient ID: [name], female   DOB: March 28, 1986, 26 y.o.   MRN: [id number] Subjective:  Ann Brown is a 26 y.o. female presenting today for followup for irregular menstrual bleeding. This has been an ongoing issue since she has received Implanon proximally one year ago. She was previously seen approximately months ago and was started on a one-month supply of Sprintec that resulted in cessation of bleeding for  1 week beyond the treatment duration.  She reports using 2-3 pads per day without heavy clotting.  Continues to smoke 1 ppd.  Pt is considering removal of device however willing to trial additional treatment at this time.  Pt denies pain beyond moderate menstrual cramps for 5-7 days per month.     ROS: Constitutional No increased fatigue, dizziness, syncope or presyncope, no wt loss  Infectious +congestion X 1 day  Resp No cough  Cardiac No CP or palpitations  GU As above  Trauma No reported trauma   PE: GENERAL:  Adult Caucasian female examined in [facility]. In no acute discomfort, no respiratory distress, alert and properly interactive  HNEENT: Atraumatic, normocephalic, no scleral pallor, no scleral icterus, extraocular muscles intact, moist mucous membranes, THORAX: HEART: RRR, s1/s2 heart, no murmur LUNGS: CTA B, mildly prolonged expiratory phase ABDOMEN:  +BS soft non-tender, no masses EXTREMITIES: Regular rate and rhythm, S1-S2 heard, no murmur warm well perfused, no swellling, no edema, LE peripheral pulses 2+/4

## 2011-04-29 ENCOUNTER — Encounter: Payer: Self-pay | Admitting: Sports Medicine

## 2011-04-29 NOTE — Assessment & Plan Note (Addendum)
Still in contemplative stage.  Pt reports she is becoming more interested in quitting - financial impact was impressive to her.

## 2011-04-29 NOTE — Assessment & Plan Note (Addendum)
1 month additional trial of sprintec as well as 14 day of doxycycline for inhibition of matrix metalloproteinases.  Pt educated on increased risk of DVT with smoking & estrogens and understands.   If current tx plan unsuccessful in establishing more consistent bleeding pattern will consider removal of inplanon and discussion of other methods of contraception.  Pt agreeable to plan.  TSH and CBC WNL at recent visit with similar complaints.

## 2011-05-17 ENCOUNTER — Encounter: Payer: Self-pay | Admitting: Family Medicine

## 2011-05-17 ENCOUNTER — Ambulatory Visit (INDEPENDENT_AMBULATORY_CARE_PROVIDER_SITE_OTHER): Payer: Medicaid Other | Admitting: Family Medicine

## 2011-05-17 DIAGNOSIS — F17200 Nicotine dependence, unspecified, uncomplicated: Secondary | ICD-10-CM

## 2011-05-17 DIAGNOSIS — J019 Acute sinusitis, unspecified: Secondary | ICD-10-CM

## 2011-05-17 MED ORDER — AMOXICILLIN 500 MG PO CAPS: 500.0000 mg | ORAL_CAPSULE | Freq: Three times a day (TID) | ORAL | Status: AC

## 2011-05-17 NOTE — Patient Instructions (Signed)
It was good to see you today! I want you to take an antibiotic three times per day for ten days for a sinus infection.  You have a viral infection that will resolve on its own over time.  Symptoms typically last 3-7 days but can stretch out to 2-3 weeks. You can use afrin for up to 5 days may help with congestion, mucinex or robitussin DM for cough., and sudafed if you don't have high blood pressure.   Drink plenty of fluids and stay hydrated! Wash your hands frequently. Call if you are not improving by 7-10 days.  You need to quit smoking!

## 2011-05-17 NOTE — Progress Notes (Signed)
UPPER RESPIRATORY INFECTION  Onset: 3 weeks  Course: worsening Better with: nothing Meds tried: none Sick contacts: yes  Nasal discharge (color,laterality): bilateral  Sinusitis Risk Factors Fever: no, subjective   Headache/face pain: yes  Double sickening: yes  Tooth pain: no   Allergy Risk Factors: Sneezing: yes  Itchy scratchy throat: yes  Seasonal sx: no   Flu Risk Factors Headache: no  Muscle aches: no  Severe fatigue: no    Red Flags  Stiff neck: no  Dyspnea: yes  Rash: no  Swallowing difficulty: no  Objective: Filed Vitals:   05/17/11 0857  BP: 114/75  Pulse: 79  Temp: 97.5 F (36.4 C)   Gen: NAD, smells of tobacco HEENT: TM clear b/l, some facial tenderness, no nasal discharge, throat mildly erythematous, mild cervical lymphadenopathy bilaterally CV: RRR Resp: CTABL   Assessment/Plan: 1) Subacute sinusitis: Plan to tx with abx as has been going on for ~2 weeks with improvement/worsening. 2) Tobacco abuse: encouraged cessation.

## 2011-05-17 NOTE — Assessment & Plan Note (Signed)
Encouraged smoking cessation 

## 2011-06-11 ENCOUNTER — Encounter: Payer: Self-pay | Admitting: Sports Medicine

## 2011-06-11 ENCOUNTER — Ambulatory Visit (INDEPENDENT_AMBULATORY_CARE_PROVIDER_SITE_OTHER): Payer: Medicaid Other | Admitting: Sports Medicine

## 2011-06-11 VITALS — BP 90/60 | HR 78 | Temp 98.3°F | Wt 109.0 lb

## 2011-06-11 DIAGNOSIS — Z3046 Encounter for surveillance of implantable subdermal contraceptive: Secondary | ICD-10-CM

## 2011-06-11 DIAGNOSIS — Z23 Encounter for immunization: Secondary | ICD-10-CM

## 2011-06-11 DIAGNOSIS — N939 Abnormal uterine and vaginal bleeding, unspecified: Secondary | ICD-10-CM

## 2011-06-11 DIAGNOSIS — N898 Other specified noninflammatory disorders of vagina: Secondary | ICD-10-CM

## 2011-06-11 MED ORDER — NORGESTIMATE-ETH ESTRADIOL 0.25-35 MG-MCG PO TABS: 1.0000 | ORAL_TABLET | Freq: Every day | ORAL | Status: DC

## 2011-06-11 NOTE — Patient Instructions (Signed)
We have removed the Implanon and are going to send in a prescription for Sprintec for your birth control.    I encourage you to use condoms on a regular basis until your menstrual cycle is regulated.    You can take over the counter Aleeve or Ibupofen for any swelling and discomfort you may have.  Also a bag of ice applied to the area for 10-15 minutes at a time every 2 to 3 hours may help decrease swelling and bruising.  Please see below for further care instructions.    Please return to see [place] in 2 to 3 months or sooner if you need [place].  Incision Care An incision is when a surgeon cuts into your body tissues. After surgery, the incision needs to be cared for properly to prevent infection.   HOME CARE INSTRUCTIONS    Take all medicine as directed by your caregiver. Only take over-the-counter or prescription medicines for pain, discomfort, or fever as directed by your caregiver.     Do not remove your bandage (dressing) or get your incision wet without permission from your surgeon.     Change the bandage right away if it becomes wet, dirty, or starts to smell bad.     Take showers. Do not take tub baths, swim, or do anything that may soak the wound until it is healed.     Resume normal diet and activities as directed or allowed.     Avoid lifting any weight until you are instructed otherwise.     Use anti-itch antihistamine medicine as directed by your caregiver. The wound may itch when it is healing. Do not pick or scratch at the wound.     Follow up with your caregiver for stitches (suture) or staple removal as directed.     Drink enough fluids to keep your urine clear or pale yellow.  SEEK MEDICAL CARE IF:    There is redness, swelling, or increasing pain in the wound that is not controlled with medication.     There is drainage, blood, or pus coming from the wound that lasts longer than 1 day.     You develop muscle aches, chills, fever, or a general ill feeling.     You notice  a bad smell coming from the wound or dressing.     The edges of the wound separate after the sutures, staples, or skin adhesive strips have been removed.     You develop persistent nausea or vomiting.  SEEK IMMEDIATE MEDICAL CARE IF:    You have a fever.     You develop a rash.     You develop dizzy episodes or faint while standing.     You have difficulty breathing.     You develop any reaction or side effects to medicine given.  MAKE SURE YOU:    Understand these instructions.     Will watch your condition.     Will get help right away if you are not doing well or get worse.  Document Released: 10/27/2004 Document Revised: 12/20/2010 Document Reviewed: 08/13/2010 [facility] Patient Information 2012 [place], [place].

## 2011-06-11 NOTE — Progress Notes (Signed)
Patient ID: [name], female   DOB: 11/30/1985, 26 y.o.   MRN: [id number] SUBJECTIVE:  ANU STAGNER is a 26 y.o. female who presents for Implanon removal. We have discussed this procedure, including option of not removing device, technique of procedure and potential for bruising, swelling and scarring.  Pt reports she has continued to bleed since stopping her sprintec and would like the device removed.  Does report decreased tobacco use to 1/2 ppd and is going to plan on using OCPs.    OBJECTIVE:  Patient appears well. Vitals are normal. Skin: Implanon palpable without signs of erythema or skin lesions.  ASSESSMENT:  Implanon removal  PLAN & Procedure Note After informed consent was obtained, using Betadine for cleansing and 1% Lidocaine without epinephrine for anesthetic, with sterile technique, a small incision was made and the Implanon device was probed with hemostats.  The device was freed from underlying tissue and was removed without difficulty in whole.  Antibiotic dressing with steri-strips were applied, and wound care instructions provided.  Be alert for any signs of cutaneous infection. The procedure was well tolerated without complications.

## 2013-01-08 ENCOUNTER — Inpatient Hospital Stay (HOSPITAL_COMMUNITY)
Admission: AD | Admit: 2013-01-08 | Discharge: 2013-01-08 | Disposition: A | Discharge status: Home / Self Care | Payer: Medicaid Other | Source: Ambulatory Visit | Attending: Obstetrics & Gynecology | Admitting: Obstetrics & Gynecology

## 2013-01-08 ENCOUNTER — Encounter (HOSPITAL_COMMUNITY): Payer: Self-pay | Admitting: *Deleted

## 2013-01-08 ENCOUNTER — Inpatient Hospital Stay (HOSPITAL_COMMUNITY): Payer: Medicaid Other

## 2013-01-08 DIAGNOSIS — R109 Unspecified abdominal pain: Secondary | ICD-10-CM | POA: Insufficient documentation

## 2013-01-08 DIAGNOSIS — B9689 Other specified bacterial agents as the cause of diseases classified elsewhere: Secondary | ICD-10-CM

## 2013-01-08 DIAGNOSIS — A499 Bacterial infection, unspecified: Secondary | ICD-10-CM | POA: Insufficient documentation

## 2013-01-08 DIAGNOSIS — N760 Acute vaginitis: Secondary | ICD-10-CM

## 2013-01-08 DIAGNOSIS — O26859 Spotting complicating pregnancy, unspecified trimester: Secondary | ICD-10-CM | POA: Insufficient documentation

## 2013-01-08 DIAGNOSIS — O2390 Unspecified genitourinary tract infection in pregnancy, unspecified trimester: Secondary | ICD-10-CM | POA: Insufficient documentation

## 2013-01-08 HISTORY — DX: Disorder of kidney and ureter, unspecified: N28.9

## 2013-01-08 LAB — OB RESULTS CONSOLE GC/CHLAMYDIA
CHLAMYDIA, DNA PROBE: NEGATIVE
GC PROBE AMP, GENITAL: NEGATIVE

## 2013-01-08 LAB — WET PREP, GENITAL: Yeast Wet Prep HPF POC: NONE SEEN

## 2013-01-08 MED ORDER — METRONIDAZOLE 500 MG PO TABS: 500.0000 mg | ORAL_TABLET | Freq: Two times a day (BID) | ORAL | Status: DC

## 2013-01-08 NOTE — MAU Provider Note (Signed)
History   Ann Brown is a 27 year old G3P1011 currently [redacted]w[redacted]d presenting for evaluation of vaginal spotting. She states that while wiping herself today, she noticed bright red spotting on the tissue paper. She denies any previous or additional episodes of spotting. She has also experienced intermittent bilateral lower abdominal pain for the past 3 weeks, which is aggravated with exertion and standing up/sitting down. She also complains of whitish, odorless vaginal discharge and increased urinary frequency for the past 2-3 months. She has taken Monistat without relief. She denies any fevers, nausea, vomiting, dysuria, or hematuria. She has not received prenatal care during this pregnancy and is waiting for her medicaid to process.   CSN: [id number]  Arrival date and time: 01/08/13 1059   First Provider Initiated Contact with Patient 01/08/13 1142      Chief Complaint  Patient presents with  . Vaginal Bleeding   Vaginal Bleeding Associated symptoms include abdominal pain and frequency. Pertinent negatives include no back pain, chills, constipation, dysuria, fever, headaches, hematuria, nausea, urgency or vomiting.     Past Medical History  Diagnosis Date  . URI 03/01/2009  . MATERNAL DM PREVIOUS POSTPARTUM CONDITION 06/15/2009  . Gestational diabetes   . Kidney problem     Kideney reflux    Past Surgical History  Procedure Laterality Date  . Bowel resection    . Cholecystectomy      No family history on file.  History  Substance Use Topics  . Smoking status: Current Every Day Smoker -- 0.50 packs/day    Types: Cigarettes  . Smokeless tobacco: Not on file  . Alcohol Use: No    Allergies: No Known Allergies  Prescriptions prior to admission  Medication Sig Dispense Refill  . albuterol (VENTOLIN HFA) 108 (90 BASE) MCG/ACT inhaler Inhale 2 puffs into the lungs every 6 (six) hours as needed for wheezing.  1 Inhaler  2  . Fluticasone-Salmeterol (ADVAIR) 100-50 MCG/DOSE  AEPB Inhale 1 puff into the lungs 2 (two) times daily.  60 each  5  . Prenatal Vit-Fe Fumarate-FA (PRENATAL MULTIVITAMIN) TABS tablet Take 1 tablet by mouth daily at 12 noon.        Review of Systems  Constitutional: Negative for fever and chills.  Respiratory: Negative for cough.   Cardiovascular: Negative for chest pain and leg swelling.  Gastrointestinal: Positive for abdominal pain. Negative for nausea, vomiting and constipation.  Genitourinary: Positive for frequency and vaginal bleeding. Negative for dysuria, urgency and hematuria.  Musculoskeletal: Negative for back pain.  Neurological: Negative for dizziness and headaches.   Physical Exam   Blood pressure 119/69, pulse 93, temperature 98.1 F (36.7 C), temperature source Oral, resp. rate 18, height 5\' 1"  (1.549 m), weight 51.619 kg (113 lb 12.8 oz), last menstrual period 08/30/2012.  Physical Exam  Constitutional: She is oriented to person, place, and time. She appears well-developed and well-nourished.  Cardiovascular: Normal rate, regular rhythm, normal heart sounds and intact distal pulses.   Respiratory: Effort normal and breath sounds normal. She has no wheezes.  GI: Soft. Bowel sounds are normal. There is no tenderness.  Gravid    Genitourinary:  Pelvic exam: Cervix pink, visually closed, without lesion, scant white creamy discharge with scant blood, vaginal walls and external genitalia normal Bimanual exam: Cervix closed, firm, neg CMT, uterus nontender, nonenlarged, adnexa without tenderness, enlargement, or mass   Neurological: She is alert and oriented to person, place, and time.  Skin: Skin is warm and dry.  Psychiatric: She has a normal  mood and affect.   Results for orders placed during the hospital encounter of 01/08/13 (from the past 24 hour(s))  WET PREP, GENITAL     Status: Abnormal   Collection Time    01/08/13  1:55 PM      Result Value Range   Yeast Wet Prep HPF POC NONE SEEN  NONE SEEN   Trich, Wet  Prep NONE SEEN  NONE SEEN   Clue Cells Wet Prep HPF POC FEW (*) NONE SEEN   WBC, Wet Prep HPF POC FEW (*) NONE SEEN     MAU Course  Procedures  MDM UA GC/Chlamdyia  Wet prep  US OB limited  Assessment and Plan  Assessment: Bacterial vaginosis IUP at [redacted]w[redacted]d  Plan: Discharge home Rx for Flagyl sent to patient's pharmacy Referred to [facility] clinic for prenatal care. They will contact the patient with an appointment. Medicaid is pending Patient may return to MAU as needed or if her condition were to change or worsen  Coy Saunas 01/08/2013, 11:44 AM   I have seen and evaluated the patient with the PA student. I agree with the assessment and plan as written above.   Freddi Starr, PA-C 01/08/2013 2:57 PM

## 2013-01-08 NOTE — MAU Note (Addendum)
Pt reprots she went to [facility] today and started spotting when she wiped. C/O mild cramping off and on when she walks. Not started prenatal care. +UPT at [facility]  [facility] 06/06/13.

## 2013-01-21 ENCOUNTER — Encounter: Payer: Self-pay | Admitting: Family Medicine

## 2013-01-21 ENCOUNTER — Ambulatory Visit (INDEPENDENT_AMBULATORY_CARE_PROVIDER_SITE_OTHER): Payer: Medicaid Other | Admitting: Family Medicine

## 2013-01-21 VITALS — BP 96/59 | Wt 114.7 lb

## 2013-01-21 DIAGNOSIS — Z8632 Personal history of gestational diabetes: Secondary | ICD-10-CM

## 2013-01-21 DIAGNOSIS — O09291 Supervision of pregnancy with other poor reproductive or obstetric history, first trimester: Secondary | ICD-10-CM

## 2013-01-21 DIAGNOSIS — Z3480 Encounter for supervision of other normal pregnancy, unspecified trimester: Secondary | ICD-10-CM | POA: Insufficient documentation

## 2013-01-21 DIAGNOSIS — Z23 Encounter for immunization: Secondary | ICD-10-CM

## 2013-01-21 DIAGNOSIS — Z3481 Encounter for supervision of other normal pregnancy, first trimester: Secondary | ICD-10-CM

## 2013-01-21 DIAGNOSIS — O09299 Supervision of pregnancy with other poor reproductive or obstetric history, unspecified trimester: Secondary | ICD-10-CM

## 2013-01-21 HISTORY — DX: Personal history of gestational diabetes: Z86.32

## 2013-01-21 LAB — POCT URINALYSIS DIP (DEVICE)
Bilirubin Urine: NEGATIVE
Glucose, UA: NEGATIVE mg/dL
Hgb urine dipstick: NEGATIVE
Nitrite: NEGATIVE
Specific Gravity, Urine: 1.015 (ref 1.005–1.030)
Urobilinogen, UA: 0.2 mg/dL (ref 0.0–1.0)
pH: 8.5 — ABNORMAL HIGH (ref 5.0–8.0)

## 2013-01-21 LAB — HIV ANTIBODY (ROUTINE TESTING W REFLEX): HIV: NONREACTIVE

## 2013-01-21 MED ORDER — INFLUENZA VIRUS VACC SPLIT PF IM SUSP
0.5000 mL | Freq: Once | INTRAMUSCULAR | Status: AC
  Administered 2013-01-21: 0.5 mL via INTRAMUSCULAR

## 2013-01-21 NOTE — Addendum Note (Signed)
Addended by: Franchot Mimes on: 01/21/2013 02:56 PM   Modules accepted: Orders

## 2013-01-21 NOTE — Progress Notes (Signed)
P=96, C/o pelvic pain. Given new patient information. Discussed bmi/ appropriate weight gain.

## 2013-01-21 NOTE — Patient Instructions (Signed)
Pregnancy - Second Trimester The second trimester of pregnancy (3 to 6 months) is a period of rapid growth for you and your baby. At the end of the sixth month, your baby is about 9 inches long and weighs 1 1/2 pounds. You will begin to feel the baby move between 18 and 20 weeks of the pregnancy. This is called quickening. Weight gain is faster. A clear fluid (colostrum) may leak out of your breasts. You may feel small contractions of the womb (uterus). This is known as false labor or Braxton-Hicks contractions. This is like a practice for labor when the baby is ready to be born. Usually, the problems with morning sickness have usually passed by the end of your first trimester. Some women develop small dark blotches (called cholasma, mask of pregnancy) on their face that usually goes away after the baby is born. Exposure to the sun makes the blotches worse. Acne may also develop in some pregnant women and pregnant women who have acne, may find that it goes away. PRENATAL EXAMS  Blood work may continue to be done during prenatal exams. These tests are done to check on your health and the probable health of your baby. Blood work is used to follow your blood levels (hemoglobin). Anemia (low hemoglobin) is common during pregnancy. Iron and vitamins are given to help prevent this. You will also be checked for diabetes between 24 and 28 weeks of the pregnancy. Some of the previous blood tests may be repeated.  The size of the uterus is measured during each visit. This is to make sure that the baby is continuing to grow properly according to the dates of the pregnancy.  Your blood pressure is checked every prenatal visit. This is to make sure you are not getting toxemia.  Your urine is checked to make sure you do not have an infection, diabetes or protein in the urine.  Your weight is checked often to make sure gains are happening at the suggested rate. This is to ensure that both you and your baby are  growing normally.  Sometimes, an ultrasound is performed to confirm the proper growth and development of the baby. This is a test which bounces harmless sound waves off the baby so your caregiver can more accurately determine due dates. Sometimes, a test is done on the amniotic fluid surrounding the baby. This test is called an amniocentesis. The amniotic fluid is obtained by sticking a needle into the belly (abdomen). This is done to check the chromosomes in instances where there is a concern about possible genetic problems with the baby. It is also sometimes done near the end of pregnancy if an early delivery is required. In this case, it is done to help make sure the baby's lungs are mature enough for the baby to live outside of the womb. CHANGES OCCURING IN THE SECOND TRIMESTER OF PREGNANCY Your body goes through many changes during pregnancy. They vary from person to person. Talk to your caregiver about changes you notice that you are concerned about.  During the second trimester, you will likely have an increase in your appetite. It is normal to have cravings for certain foods. This varies from person to person and pregnancy to pregnancy.  Your lower abdomen will begin to bulge.  You may have to urinate more often because the uterus and baby are pressing on your bladder. It is also common to get more bladder infections during pregnancy. You can help this by drinking lots of fluids   and emptying your bladder before and after intercourse.  You may begin to get stretch marks on your hips, abdomen, and breasts. These are normal changes in the body during pregnancy. There are no exercises or medicines to take that prevent this change.  You may begin to develop swollen and bulging veins (varicose veins) in your legs. Wearing support hose, elevating your feet for 15 minutes, 3 to 4 times a day and limiting salt in your diet helps lessen the problem.  Heartburn may develop as the uterus grows and  pushes up against the stomach. Antacids recommended by your caregiver helps with this problem. Also, eating smaller meals 4 to 5 times a day helps.  Constipation can be treated with a stool softener or adding bulk to your diet. Drinking lots of fluids, and eating vegetables, fruits, and whole grains are helpful.  Exercising is also helpful. If you have been very active up until your pregnancy, most of these activities can be continued during your pregnancy. If you have been less active, it is helpful to start an exercise program such as walking.  Hemorrhoids may develop at the end of the second trimester. Warm sitz baths and hemorrhoid cream recommended by your caregiver helps hemorrhoid problems.  Backaches may develop during this time of your pregnancy. Avoid heavy lifting, wear low heal shoes, and practice good posture to help with backache problems.  Some pregnant women develop tingling and numbness of their hand and fingers because of swelling and tightening of ligaments in the wrist (carpel tunnel syndrome). This goes away after the baby is born.  As your breasts enlarge, you may have to get a bigger bra. Get a comfortable, cotton, support bra. Do not get a nursing bra until the last month of the pregnancy if you will be nursing the baby.  You may get a dark line from your belly button to the pubic area called the linea nigra.  You may develop rosy cheeks because of increase blood flow to the face.  You may develop spider looking lines of the face, neck, arms, and chest. These go away after the baby is born. HOME CARE INSTRUCTIONS   It is extremely important to avoid all smoking, herbs, alcohol, and unprescribed drugs during your pregnancy. These chemicals affect the formation and growth of the baby. Avoid these chemicals throughout the pregnancy to ensure the delivery of a healthy infant.  Most of your home care instructions are the same as suggested for the first trimester of your  pregnancy. Keep your caregiver's appointments. Follow your caregiver's instructions regarding medicine use, exercise, and diet.  During pregnancy, you are providing food for you and your baby. Continue to eat regular, well-balanced meals. Choose foods such as meat, fish, milk and other low fat dairy products, vegetables, fruits, and whole-grain breads and cereals. Your caregiver will tell you of the ideal weight gain.  A physical sexual relationship may be continued up until near the end of pregnancy if there are no other problems. Problems could include early (premature) leaking of amniotic fluid from the membranes, vaginal bleeding, abdominal pain, or other medical or pregnancy problems.  Exercise regularly if there are no restrictions. Check with your caregiver if you are unsure of the safety of some of your exercises. The greatest weight gain will occur in the last 2 trimesters of pregnancy. Exercise will help you:  Control your weight.  Get you in shape for labor and delivery.  Lose weight after you have the baby.  Wear   a good support or jogging bra for breast tenderness during pregnancy. This may help if worn during sleep. Pads or tissues may be used in the bra if you are leaking colostrum.  Do not use hot tubs, steam rooms or saunas throughout the pregnancy.  Wear your seat belt at all times when driving. This protects you and your baby if you are in an accident.  Avoid raw meat, uncooked cheese, cat litter boxes, and soil used by cats. These carry germs that can cause birth defects in the baby.  The second trimester is also a good time to visit your dentist for your dental health if this has not been done yet. Getting your teeth cleaned is okay. Use a soft toothbrush. Brush gently during pregnancy.  It is easier to leak urine during pregnancy. Tightening up and strengthening the pelvic muscles will help with this problem. Practice stopping your urination while you are going to the  bathroom. These are the same muscles you need to strengthen. It is also the muscles you would use as if you were trying to stop from passing gas. You can practice tightening these muscles up 10 times a set and repeating this about 3 times per day. Once you know what muscles to tighten up, do not perform these exercises during urination. It is more likely to contribute to an infection by backing up the urine.  Ask for help if you have financial, counseling, or nutritional needs during pregnancy. Your caregiver will be able to offer counseling for these needs as well as refer you for other special needs.  Your skin may become oily. If so, wash your face with mild soap, use non-greasy moisturizer and oil or cream based makeup. MEDICINES AND DRUG USE IN PREGNANCY  Take prenatal vitamins as directed. The vitamin should contain 1 milligram of folic acid. Keep all vitamins out of reach of children. Only a couple vitamins or tablets containing iron may be fatal to a baby or young child when ingested.  Avoid use of all medicines, including herbs, over-the-counter medicines, not prescribed or suggested by your caregiver. Only take over-the-counter or prescription medicines for pain, discomfort, or fever as directed by your caregiver. Do not use aspirin.  Let your caregiver also know about herbs you may be using.  Alcohol is related to a number of birth defects. This includes fetal alcohol syndrome. All alcohol, in any form, should be avoided completely. Smoking will cause low birth rate and premature babies.  Street or illegal drugs are very harmful to the baby. They are absolutely forbidden. A baby born to an addicted mother will be addicted at birth. The baby will go through the same withdrawal an adult does. SEEK MEDICAL CARE IF:  You have any concerns or worries during your pregnancy. It is better to call with your questions if you feel they cannot wait, rather than worry about them. SEEK IMMEDIATE  MEDICAL CARE IF:   An unexplained oral temperature above 102 F (38.9 C) develops, or as your caregiver suggests.  You have leaking of fluid from the vagina (birth canal). If leaking membranes are suspected, take your temperature and tell your caregiver of this when you call.  There is vaginal spotting, bleeding, or passing clots. Tell your caregiver of the amount and how many pads are used. Light spotting in pregnancy is common, especially following intercourse.  You develop a bad smelling vaginal discharge with a change in the color from clear to white.  You continue to feel   sick to your stomach (nauseated) and have no relief from remedies suggested. You vomit blood or coffee ground-like materials.  You lose more than 2 pounds of weight or gain more than 2 pounds of weight over 1 week, or as suggested by your caregiver.  You notice swelling of your face, hands, feet, or legs.  You get exposed to German measles and have never had them.  You are exposed to fifth disease or chickenpox.  You develop belly (abdominal) pain. Round ligament discomfort is a common non-cancerous (benign) cause of abdominal pain in pregnancy. Your caregiver still must evaluate you.  You develop a bad headache that does not go away.  You develop fever, diarrhea, pain with urination, or shortness of breath.  You develop visual problems, blurry, or double vision.  You fall or are in a car accident or any kind of trauma.  There is mental or physical violence at home. Document Released: 04/03/2001 Document Revised: 01/02/2012 Document Reviewed: 10/06/2008 [employer] Patient Information 2014 [employer].  

## 2013-01-21 NOTE — Progress Notes (Signed)
Subjective:    Ann Brown is a [id number] [redacted]w[redacted]d being seen today for her first obstetrical visit.  Unplanned pregnancy. Her obstetrical history is significant for gestational diabetes with previous pregnancy and current tobacco abuse. Patient does intend to breast feed. Pregnancy history fully reviewed.  Patient reports no bleeding, no contractions, no cramping and no leaking.  Filed Vitals:   01/21/13 0938  BP: 96/59  Weight: 114 lb 11.2 oz (52.028 kg)    HISTORY: OB History  Gravida Para Term Preterm AB SAB TAB Ectopic Multiple Living  3 1 1  1     1     # Outcome Date GA Lbr Len/2nd Weight Sex Delivery Anes PTL Lv  3 CUR           2 TRM 05/03/09 [redacted]w[redacted]d  5 lb 10 oz (2.551 kg) F SVD EPI  Y     Comments: GDM, PTL  born in [facility]  1 ABT 2003             Past Medical History  Diagnosis Date  . URI 03/01/2009  . MATERNAL DM PREVIOUS POSTPARTUM CONDITION 06/15/2009  . Gestational diabetes   . Kidney problem     Kideney reflux  . Breast mass    Past Surgical History  Procedure Laterality Date  . Bowel resection    . Cholecystectomy    . Csf shunt  1987  . Tracheostomy  1987  . Complex trunk closure  2005   Family History  Problem Relation Age of Onset  . Multiple sclerosis Mother   . Cervical cancer Mother      Exam    Uterus:   size appropriate for dates  Pelvic Exam:    Perineum: No Hemorrhoids   Vulva: normal   Vagina:  normal mucosa   Cervix: no cervical motion tenderness and no lesions   Adnexa: normal adnexa and no mass, fullness, tenderness   Bony Pelvis: gynecoid  System: Breast:  normal appearance, no masses or tenderness, inverted nipples.    Skin: normal coloration and turgor, no rashes    Neurologic: oriented, normal   Extremities: normal strength, tone, and muscle mass   HEENT PERRLA and extra ocular movement intact   Mouth/Teeth mucous membranes moist, pharynx normal without lesions   Neck supple   Cardiovascular: regular rate and rhythm, no  murmurs or gallops   Respiratory:  appears well, vitals normal, no respiratory distress, acyanotic, normal RR, ear and throat exam is normal, neck free of mass or lymphadenopathy, chest clear, no wheezing, crepitations, rhonchi, normal symmetric air entry   Abdomen: soft, non-tender; bowel sounds normal; no masses,  no organomegaly   Urinary: urethral meatus normal      Assessment:    Pregnancy: [id number] Patient Active Problem List   Diagnosis Date Noted  . Supervision of normal subsequent pregnancy 01/21/2013  . H/O gestational diabetes in prior pregnancy, currently pregnant 01/21/2013  . Vaginal bleeding problems 12/12/2010  . Gastroesophageal reflux 12/12/2010  . Asthma 12/12/2010  . Foot lesion 12/12/2010  . ADJUSTMENT DISORDER WITH ANXIETY 02/28/2010  . TOBACCO USER 04/25/2009        Plan:     Initial labs drawn. Early 1 hour glucola done given hx of gDM Prenatal vitamins. Problem list reviewed and updated. Urged to quit smoking Genetic Screening discussed Quad Screen: requested.  Ultrasound discussed; fetal survey: ordered.  Follow up in 4 weeks. 50% of 30 min visit spent on counseling and coordination of care.  Pressley Barsky L 01/21/2013

## 2013-01-21 NOTE — Addendum Note (Signed)
Addended by: Franchot Mimes on: 01/21/2013 12:16 PM   Modules accepted: Orders

## 2013-01-22 LAB — AFP, QUAD SCREEN
AFP: 68.9 IU/mL
Curr Gest Age: 18.6 wks.days
INH: 197.5 pg/mL
Interpretation-AFP: NEGATIVE
MoM for AFP: 1.34
Trisomy 18 (Edward) Syndrome Interp.: 1:7010 {titer}
uE3 Value: 0.6 ng/mL

## 2013-01-22 LAB — OBSTETRIC PANEL
Antibody Screen: NEGATIVE
Basophils Relative: 0 % (ref 0–1)
Eosinophils Absolute: 0 10*3/uL (ref 0.0–0.7)
Eosinophils Relative: 0 % (ref 0–5)
Hemoglobin: 13.3 g/dL (ref 12.0–15.0)
Lymphs Abs: 1.9 10*3/uL (ref 0.7–4.0)
MCH: 35 pg — ABNORMAL HIGH (ref 26.0–34.0)
MCHC: 35 g/dL (ref 30.0–36.0)
MCV: 100 fL (ref 78.0–100.0)
Monocytes Relative: 6 % (ref 3–12)
RBC: 3.8 MIL/uL — ABNORMAL LOW (ref 3.87–5.11)
Rh Type: POSITIVE

## 2013-01-22 LAB — CULTURE, OB URINE

## 2013-01-22 LAB — GLUCOSE TOLERANCE, 1 HOUR (50G) W/O FASTING: Glucose, 1 Hour GTT: 87 mg/dL (ref 70–140)

## 2013-01-23 ENCOUNTER — Encounter: Payer: Self-pay | Admitting: Family Medicine

## 2013-02-01 ENCOUNTER — Encounter: Payer: Self-pay | Admitting: Family Medicine

## 2013-02-01 DIAGNOSIS — IMO0001 Reserved for inherently not codable concepts without codable children: Secondary | ICD-10-CM | POA: Insufficient documentation

## 2013-02-01 DIAGNOSIS — R896 Abnormal cytological findings in specimens from other organs, systems and tissues: Secondary | ICD-10-CM

## 2013-02-09 ENCOUNTER — Telehealth: Payer: Self-pay | Admitting: *Deleted

## 2013-02-09 NOTE — Telephone Encounter (Signed)
Message copied by Gerome Apley on Mon Feb 09, 2013  2:31 PM ------      Message from: [place]      Created: Sun Feb 01, 2013 10:18 PM       ASCUS with negative HPV.  Per guidelines, she will need repeat cotesting in 3 years. No further intervention at this time. Will you let her know? Thank you!!! ------

## 2013-02-09 NOTE — Telephone Encounter (Signed)
Called [place] and left a message we are calling with some non- urgent information from your provider. Please call clinic. Next appt 02/18/13.

## 2013-02-11 ENCOUNTER — Ambulatory Visit (HOSPITAL_COMMUNITY): Admission: RE | Admit: 2013-02-11 | Payer: Medicaid Other | Source: Ambulatory Visit

## 2013-02-11 ENCOUNTER — Ambulatory Visit (HOSPITAL_COMMUNITY)
Admission: RE | Admit: 2013-02-11 | Discharge: 2013-02-11 | Disposition: A | Discharge status: Home / Self Care | Payer: Medicaid Other | Source: Ambulatory Visit | Attending: Family Medicine | Admitting: Family Medicine

## 2013-02-11 DIAGNOSIS — Z3481 Encounter for supervision of other normal pregnancy, first trimester: Secondary | ICD-10-CM

## 2013-02-11 DIAGNOSIS — O09299 Supervision of pregnancy with other poor reproductive or obstetric history, unspecified trimester: Secondary | ICD-10-CM | POA: Insufficient documentation

## 2013-02-11 DIAGNOSIS — O09291 Supervision of pregnancy with other poor reproductive or obstetric history, first trimester: Secondary | ICD-10-CM

## 2013-02-11 IMAGING — US US OB COMP +14 WK
1 of 2 series · 12 of 28 positions shown · non-contrast
Comparison: none

[Series 1: us ob comp +14 wk · 65 acquisitions, 12 frames shown]
[im 1/65]
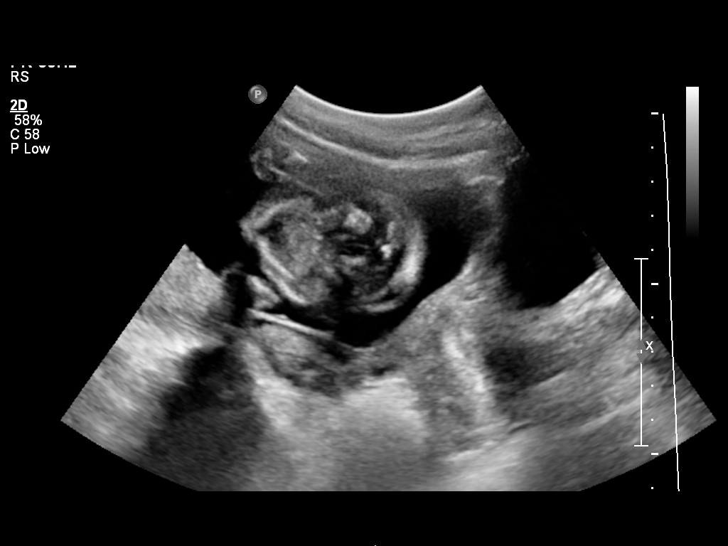
[im 5/65]
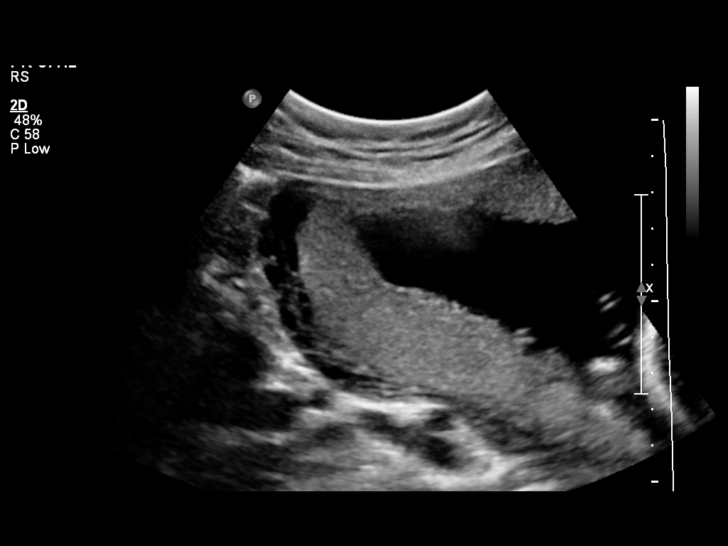
[im 10/65]
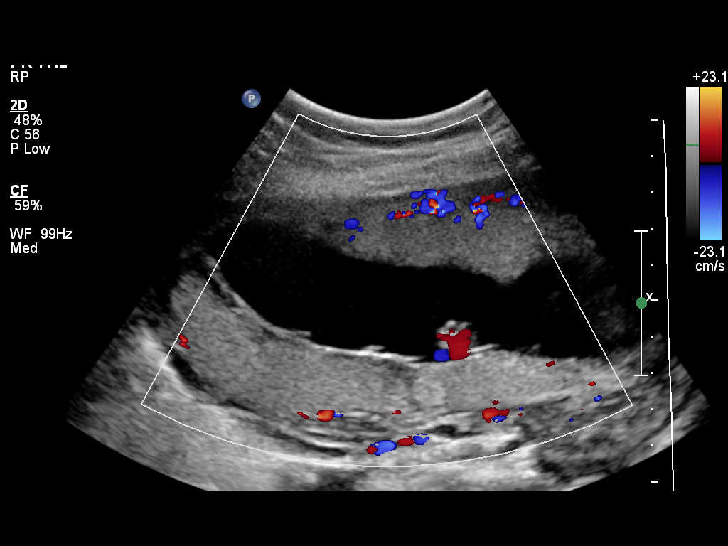
[im 18/65]
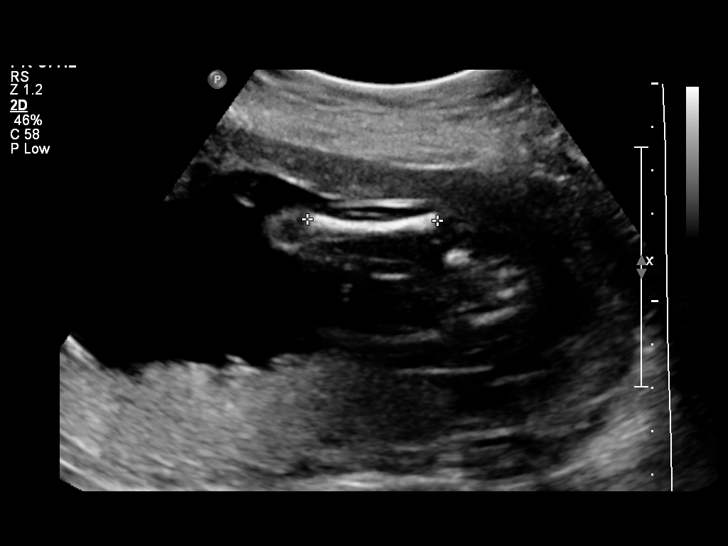
[im 23/65]
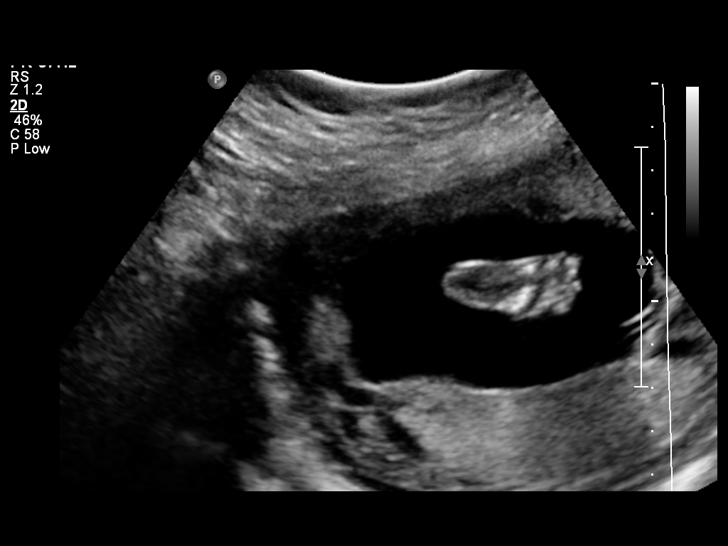
[im 28/65]
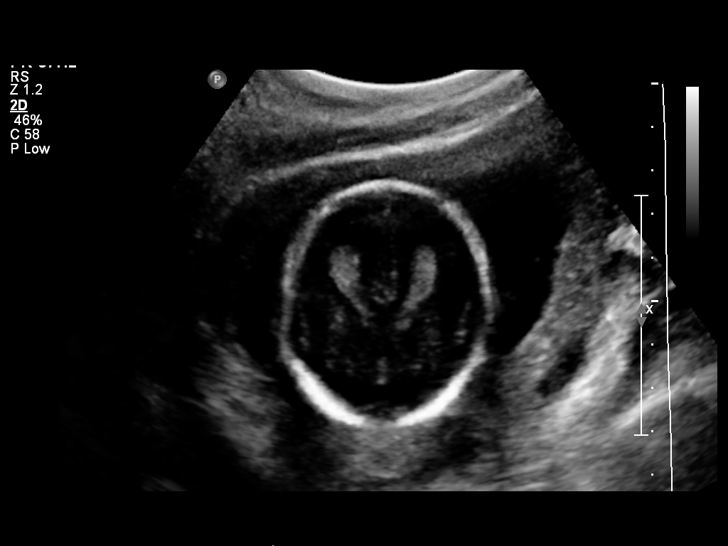
[im 35/65]
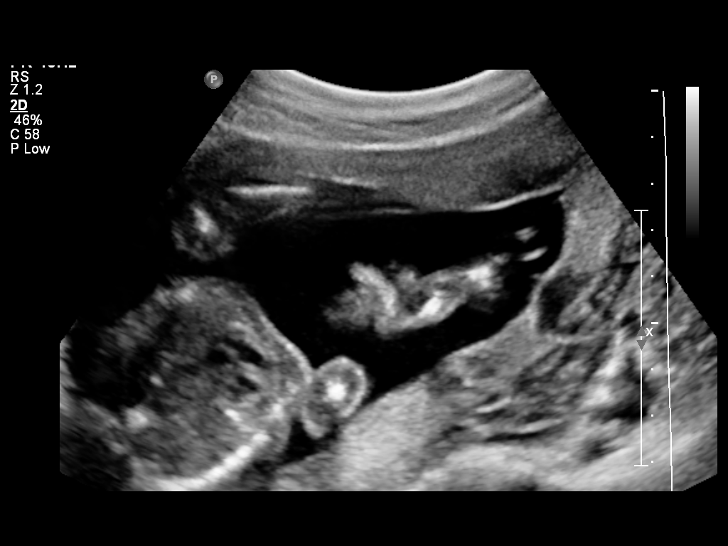
[im 40/65]
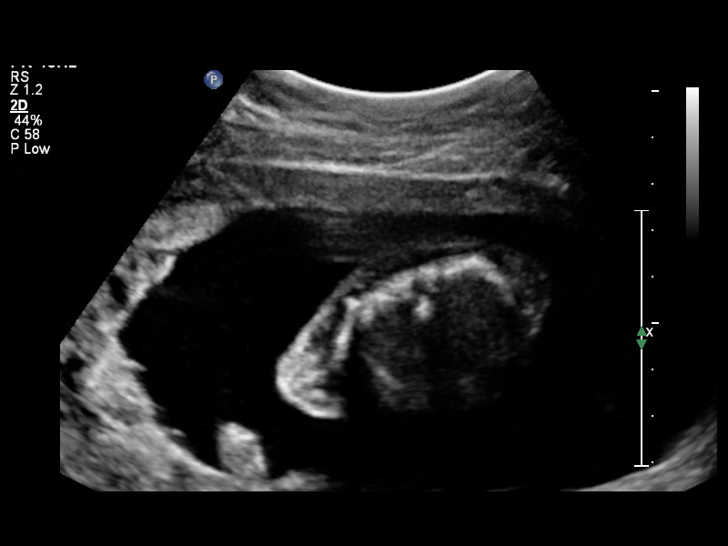
[im 45/65]
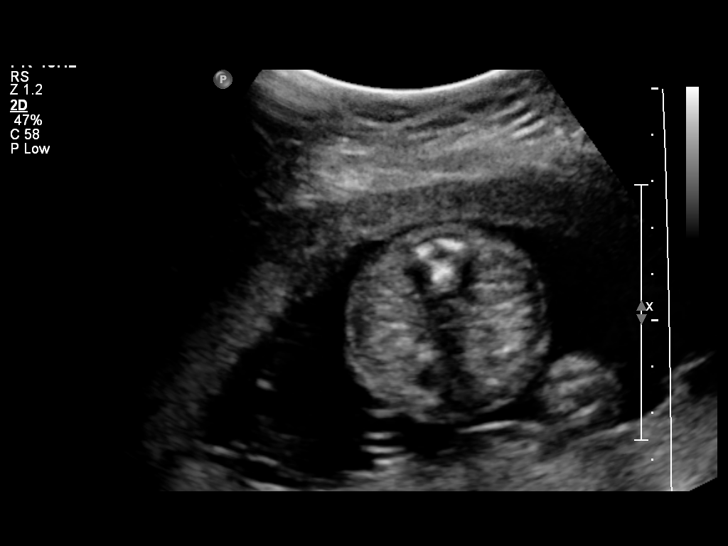
[im 52/65]
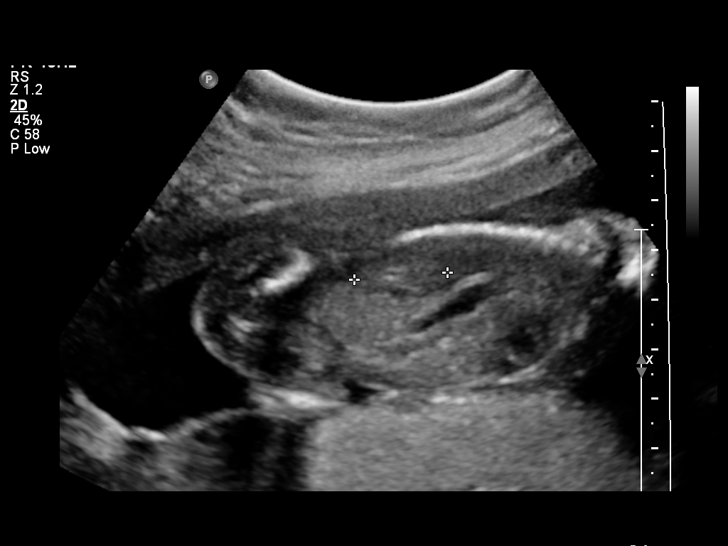
[im 57/65]
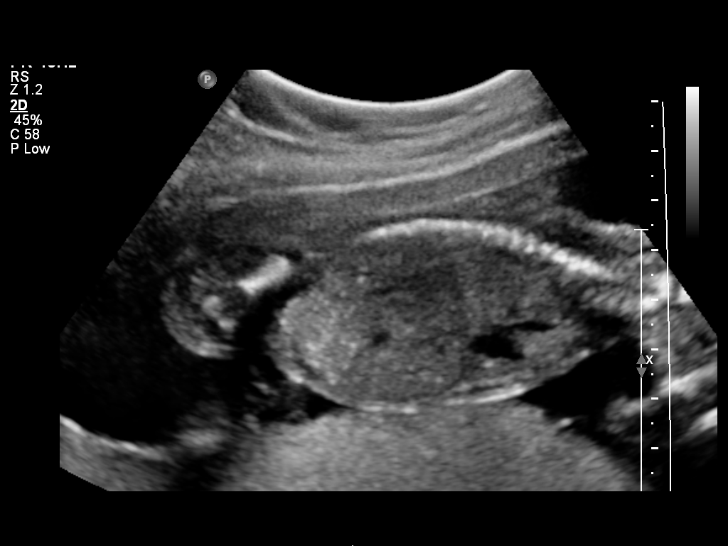
[im 62/65]
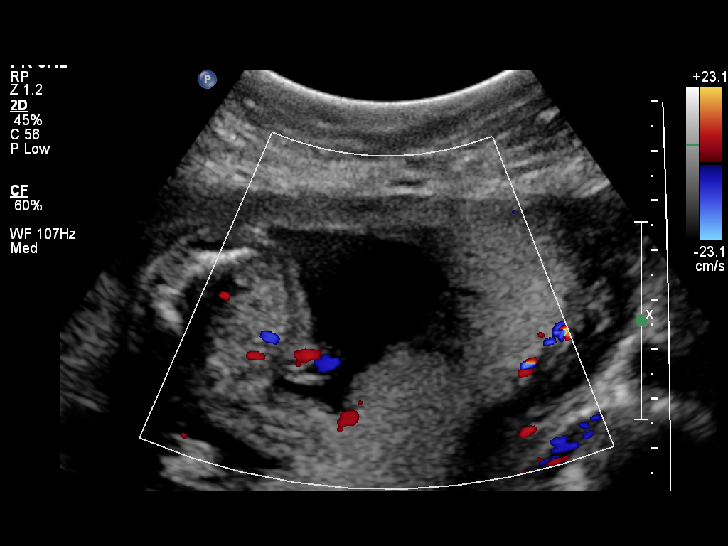

[12 of 28 positions shown; findings below may reference images not displayed]

OBSTETRICS REPORT
                      (Signed Final [DATE] [DATE])

Service(s) Provided

 US OB COMP + 14 WK                                    76805.1
Indications

 Basic anatomic survey                                 [G3]
 Poor obstetric history: Previous gestational          [G3]
 diabetes
 Cigarette smoker
Fetal Evaluation

 Num Of Fetuses:    1
 Fetal Heart Rate:  142                          bpm
 Cardiac Activity:  Observed
 Presentation:      Cephalic
 Placenta:          Posterior, above cervical
                    os
 P. Cord            Visualized, central
 Insertion:

 Amniotic Fluid
 AFI FV:      Subjectively within normal limits
                                             Larg Pckt:     4.7  cm
 RLQ:   4.7     cm
Biometry

 BPD:     47.8  mm     G. Age:  20w 3d                CI:        73.97   70 - 86
                                                      FL/HC:      17.1   16.8 -

 HC:     176.5  mm     G. Age:  20w 1d       49  %    HC/AC:      1.18   1.09 -

 AC:     149.4  mm     G. Age:  20w 1d       51  %    FL/BPD:
 FL:      30.1  mm     G. Age:  19w 2d       20  %    FL/AC:      20.1   20 - 24
 HUM:     28.4  mm     G. Age:  19w 1d       32  %

 Est. FW:     316  gm    0 lb 11 oz      47  %
Gestational Age

 LMP:           23w 4d        Date:  [DATE]                 EDD:   [DATE]
 U/S Today:     20w 0d                                        EDD:   [DATE]
 Best:          20w 0d     Det. By:  U/S ([DATE])           EDD:   [DATE]
Anatomy

 Cranium:          Appears normal         Aortic Arch:      Appears normal
 Fetal Cavum:      Appears normal         Ductal Arch:      Appears normal
 Ventricles:       Appears normal         Diaphragm:        Appears normal
 Choroid Plexus:   Appears normal         Stomach:          Appears normal, left
                                                            sided
 Cerebellum:       Appears normal         Abdomen:          Appears normal
 Posterior Fossa:  Appears normal         Abdominal Wall:   Appears nml (cord
                                                            insert, abd wall)
 Nuchal Fold:      Appears normal         Cord Vessels:     Appears normal (3
                                                            vessel cord)
 Face:             Profile appears        Kidneys:          Appear normal
                   normal
 Lips:             Appears normal         Bladder:          Appears normal
 Heart:            Appears normal         Spine:            Appears normal
                   (4CH, axis, and
                   situs)
 RVOT:             Not well visualized    Lower             Appears normal
                                          Extremities:
 LVOT:             Not well visualized    Upper             Appears normal
                                          Extremities:

 Other:  Male gender.
Cervix Uterus Adnexa

 Cervical Length:    3.4      cm

 Cervix:       Normal appearance by transabdominal scan.
 Uterus:       No abnormality visualized.
 Left Ovary:    No adnexal mass visualized.
 Right Ovary:   No adnexal mass visualized.

 Adnexa:     No abnormality visualized.
Impression

 IUP at 20+0 weeks
 Normal detailed fetal anatomy; outflow tracts not optimally
 visualized
 Markers of aneuploidy: none
 Normal amniotic fluid volume
 Measurements consistent with prior US
Recommendations

 Follow-up ultrasound in 4-6 weeks to complete anatomy
 survey

## 2013-02-12 ENCOUNTER — Encounter: Payer: Self-pay | Admitting: Sports Medicine

## 2013-02-13 ENCOUNTER — Encounter: Payer: Self-pay | Admitting: Family Medicine

## 2013-02-17 ENCOUNTER — Ambulatory Visit (INDEPENDENT_AMBULATORY_CARE_PROVIDER_SITE_OTHER): Payer: Medicaid Other | Admitting: Sports Medicine

## 2013-02-17 VITALS — BP 80/69 | Temp 98.6°F | Wt 118.0 lb

## 2013-02-17 DIAGNOSIS — K219 Gastro-esophageal reflux disease without esophagitis: Secondary | ICD-10-CM

## 2013-02-17 DIAGNOSIS — N939 Abnormal uterine and vaginal bleeding, unspecified: Secondary | ICD-10-CM

## 2013-02-17 DIAGNOSIS — N898 Other specified noninflammatory disorders of vagina: Secondary | ICD-10-CM

## 2013-02-17 DIAGNOSIS — O09292 Supervision of pregnancy with other poor reproductive or obstetric history, second trimester: Secondary | ICD-10-CM

## 2013-02-17 DIAGNOSIS — F4322 Adjustment disorder with anxiety: Secondary | ICD-10-CM

## 2013-02-17 DIAGNOSIS — F17200 Nicotine dependence, unspecified, uncomplicated: Secondary | ICD-10-CM

## 2013-02-17 DIAGNOSIS — Z3482 Encounter for supervision of other normal pregnancy, second trimester: Secondary | ICD-10-CM

## 2013-02-17 DIAGNOSIS — J45909 Unspecified asthma, uncomplicated: Secondary | ICD-10-CM

## 2013-02-17 DIAGNOSIS — O09299 Supervision of pregnancy with other poor reproductive or obstetric history, unspecified trimester: Secondary | ICD-10-CM

## 2013-02-17 DIAGNOSIS — Z3480 Encounter for supervision of other normal pregnancy, unspecified trimester: Secondary | ICD-10-CM

## 2013-02-17 MED ORDER — RANITIDINE HCL 150 MG PO TABS: 150.0000 mg | ORAL_TABLET | Freq: Two times a day (BID) | ORAL | Status: DC

## 2013-02-17 NOTE — Progress Notes (Signed)
Pt is a 27 y.o. G3P1011 [redacted]w[redacted]d by ultrasound who presents today for routine prenatal care.  She was previously followed in the high risk clinic for her last pregnancy due to gestational diabetes.  She was seen in the low risk clinic for her initial prenatal visit but is wishing to be followed at the [facility] family practice Center.  Her initial early Glucola was negative for diabetes.  She has no other high risk features. Needs repeat US in 4 weeks do to only partial visualization Zantac for reflux symptoms. Quit smoking. Continue prenatal vitamin. Discussed the need to cut back on caffeine as drinking excessive amounts (up to 3 pots per day) Also discussed significant medical history including concern for Hashimoto's thyroiditis that has been lost to followup.  Will try to obtain records. Upon further chart review after the patient left it was noted she had a contaminant on her urine culture and will need to return for a collection of this and we will check a TSH, free T4 at this time Plan for followup in 4 weeks with myself. Return to clinic in next 3-4 days for:  > TSH  > Repeat Urine Culture  > Medicaid Pregnancy Medical Home Form Completion See Problem Based Charting as well

## 2013-02-17 NOTE — Patient Instructions (Addendum)
Keep taking your prenatal vitamin. Stop smoking.  Back on the caffeine. I am trying to obtain records regarding her history. Please call and cancel your appointment with the [facility] tomorrow We will repeat an ultrasound in 4 weeks after your next visit with me. Try Zantac Decrease your albuterol to one puff once a day, in one week stop it completely unless you are having symptoms.  Continue with the Advair on a daily basis.  Here are some basic nutrition rules to remember:  "Eat Real Foods & Drink Real Drinks" - if you think it was made in a factory . . it is likely best to avoid it as a staple in your diet.  Limiting these types of foods to 1-2 times per week is a good idea.  Sticking with fresh fruits and vegetables as well as home cooked meals will typically provide more nutrition and less salt than prepackaged meals.     Limit the amount of sugar sweetened and artificially sweetened foods and beverages.  Sticking with water flavored with a slice of lemon, lime or orange is a great option if you want something with flavor in it.  Using flavored seltzer water to flavor plain water will also add some bite if you want something more than flavor.  Avoid soda, juices and generally any bottled beverage is a good idea.     Eat at least 3 meals and 1-2 snacks per day.  Aim for no more than 5 hours between eating.   Here are 2 of my favorite web sites that provide great nutrition and exercise advice.   www.eats[place]movemore[place].com www.choosemyplate.gov

## 2013-02-18 ENCOUNTER — Encounter: Payer: Self-pay | Admitting: Obstetrics and Gynecology

## 2013-02-18 NOTE — Assessment & Plan Note (Signed)
Start Zantac, hx of prior issues during pregnancy with Reflux

## 2013-02-18 NOTE — Assessment & Plan Note (Signed)
Discussed maternal and fetal risks associated with tobacco use in pregnancy. > Patient is contemplative and could consider nicotine patches if committed to quitting  > Follow up = Recommend 1800 QUIT NOW and Tobacco Cessation Clinic with Dr. Raymondo Band if pt interested

## 2013-02-18 NOTE — Telephone Encounter (Signed)
Spoke to patient and notified of result and recommendation noted below. Patient agrees and satisfied.

## 2013-02-18 NOTE — Assessment & Plan Note (Addendum)
Patient is currently low risk given no gestational diabetes and no chronic medical conditions. Given history of abnormal thyroid tests will try to obtain records from [facility] and [facility].  Contaminant on Urine Culture.  Will have patient return for repeat urine culture and TSH check given history of ?Thyroid disorder and not checked since moving to [place].

## 2013-02-18 NOTE — Telephone Encounter (Signed)
Called patient at home number and that has been disconnected. Called patient at mobile number twice and the line was busy each time

## 2013-02-19 ENCOUNTER — Telehealth: Payer: Self-pay | Admitting: *Deleted

## 2013-02-19 NOTE — Telephone Encounter (Signed)
Message copied by Farrell Ours on Thu Feb 19, 2013 10:50 AM ------      Message from: Gaspar Bidding D      Created: Wed Feb 18, 2013  3:52 PM       Please call pt and have her return this week for a repeat urine culture (was contaminated) and to check her thyroid function.  No further testing needed at this time. ------

## 2013-02-20 NOTE — Telephone Encounter (Signed)
Message copied by Farrell Ours on Fri Feb 20, 2013  1:18 PM ------      Message from: Gaspar Bidding D      Created: Wed Feb 18, 2013  3:52 PM       Please call pt and have her return this week for a repeat urine culture (was contaminated) and to check her thyroid function.  No further testing needed at this time. ------

## 2013-02-24 ENCOUNTER — Encounter: Payer: Self-pay | Admitting: Sports Medicine

## 2013-02-24 DIAGNOSIS — Z8639 Personal history of other endocrine, nutritional and metabolic disease: Secondary | ICD-10-CM | POA: Insufficient documentation

## 2013-03-06 ENCOUNTER — Ambulatory Visit (INDEPENDENT_AMBULATORY_CARE_PROVIDER_SITE_OTHER): Payer: Medicaid Other | Admitting: Family Medicine

## 2013-03-06 VITALS — BP 107/62 | Temp 99.0°F | Wt 125.3 lb

## 2013-03-06 DIAGNOSIS — Z3482 Encounter for supervision of other normal pregnancy, second trimester: Secondary | ICD-10-CM

## 2013-03-06 DIAGNOSIS — K1379 Other lesions of oral mucosa: Secondary | ICD-10-CM | POA: Insufficient documentation

## 2013-03-06 DIAGNOSIS — Z3480 Encounter for supervision of other normal pregnancy, unspecified trimester: Secondary | ICD-10-CM

## 2013-03-06 DIAGNOSIS — K1370 Unspecified lesions of oral mucosa: Secondary | ICD-10-CM

## 2013-03-06 MED ORDER — OXYCODONE HCL 10 MG PO TABS: 10.0000 mg | ORAL_TABLET | Freq: Two times a day (BID) | ORAL | Status: DC

## 2013-03-06 NOTE — Patient Instructions (Addendum)
Thank you for coming in,   I will give you some OxyContin for your pain.   Please call your dentist as soon as you can and follow up with them.   Please tell them that you should NOT receive any NSAIDS such as motrin which is ibuprofen during pregnancy.   Please seek immediate medical help if your pain becomes worse or you notice draining from the sight.   Please call us back if you have any fever.    Please feel free to call with any questions or concerns at any time, at [phone]. --Dr. Jordan Likes

## 2013-03-06 NOTE — Progress Notes (Signed)
Pt. Is a 27 yo F G3P1011 [redacted]w[redacted]d who presents today for tooth pain.   #tooth pain: She had a top molar on the left side pulled and three teeth filled on Monday.  She had to have it pulled because it broke a couple of months ago. She was told to take motrin for her pain in her jaw but it has not relieved her pain. She had excess bleeding the night after having her tooth pulled. She called her dentist but they aren't open on Fridays which is why she is here.  The pain is worse when she goes out into the cold. The pain is on her left side and her face feels swollen and warm. She has been washing her mouth out with salt water and seems to be helping. She has had a tooth pulled last year because an abscess had formed. She had to be treated with antibiotics at that time. She denies fevers and chills.   ROS: All other systems reviewed and otherwise normal.   O: BP 107/62  Temp(Src) 99 F (37.2 C)  Wt 125 lb 4.8 oz (56.836 kg)  LMP 08/30/2012 Gen: NAD, alert, cooperative with exam HEENT: Several fillings noted, no draining observed in her mouth, no erythema, her left side of her face is mildly swollen, point tenderness on her left maxillary sinus,   A/p:

## 2013-03-06 NOTE — Assessment & Plan Note (Signed)
Pain since Monday - given 7 tablets of Oxycotin  - enough to cover until she can call her dentist on Monday  - had been given motrin for pain -->expressed she should not take anymore NSAIDS while being pregnant. Her dentist told her to take motrin since she is intolerant to Tylenol.  - She isn't able to take tylenol due to an intolerance. Hasn't been able to take it even after her cholecystectomy  Her dentist is:  Donavan Foil  [place], [place]  [phone]

## 2013-03-07 LAB — CULTURE, OB URINE: Organism ID, Bacteria: NO GROWTH

## 2013-03-09 ENCOUNTER — Telehealth: Payer: Self-pay

## 2013-03-09 NOTE — Telephone Encounter (Signed)
Patient has dry socket and dentist will not give any pain meds (suggest Motrin/ibupophen). Patient request pain meds since she was seen for issue on Friday.

## 2013-03-12 ENCOUNTER — Telehealth: Payer: Self-pay | Admitting: Sports Medicine

## 2013-03-13 ENCOUNTER — Ambulatory Visit (INDEPENDENT_AMBULATORY_CARE_PROVIDER_SITE_OTHER): Payer: Medicaid Other | Admitting: Family Medicine

## 2013-03-13 VITALS — BP 104/73 | Temp 98.1°F | Wt 119.7 lb

## 2013-03-13 DIAGNOSIS — K1370 Unspecified lesions of oral mucosa: Secondary | ICD-10-CM

## 2013-03-13 DIAGNOSIS — K1379 Other lesions of oral mucosa: Secondary | ICD-10-CM

## 2013-03-13 NOTE — Progress Notes (Signed)
Family Medicine Office Visit Note   Subjective:   Patient ID: Ann Brown, female  DOB: Apr 01, 1986, 27 y.o.. MRN: [id number]   Pt that comes today complaining of pain in her jaw secondary to dry socket. She reports had recently a molar extraction and after that she was diagnosed with dry socket. She reports it was cleaned and packed at the dentist last Monday but no medication for pain was given at that time. Pt states she has been with intractable pain since her procedure on Monday. She states comes today to [place] for solely management of her pain.  Percocet was given after the extracction and she is forcefully requesting refill of this specific medication.   Pt reports she can not take anything that contains Tylenol due to gall bladder surgery and the reaction when she takes it is nausea. She also reports can no take Ibuprofen due to hx of "kidney reflux".There is no documentation of allergy or side effect on her chart with acetaminophen or NSAIDs  Review of Systems:  Per HPI  Objective:   Physical Exam: GEN: pt sitting in exam table in no acute distress, initially.  At  the end of visit she was upset and verbally abusive to [place].  Pt refused examination.   Assessment & Plan:    We discuss with attending Dr. Armen Pickup. Since pt is pregnant with 24 weeks and her intolerance to typical analgesic medications we offered Tramadol for pain control. Pt refused examination, did not agree with plan and left  the room abruptly.

## 2013-03-13 NOTE — Assessment & Plan Note (Signed)
Came today for pain management of her dry socket. Refused examination and Tramadol as pain medication. Forcefully asking for Oxycodone. Left the room abruptly after disagreement with plan offered.

## 2013-03-30 ENCOUNTER — Ambulatory Visit (INDEPENDENT_AMBULATORY_CARE_PROVIDER_SITE_OTHER): Payer: Medicaid Other | Admitting: Sports Medicine

## 2013-03-30 ENCOUNTER — Encounter: Payer: Self-pay | Admitting: Sports Medicine

## 2013-03-30 VITALS — BP 94/55 | HR 96 | Temp 98.1°F | Wt 121.4 lb

## 2013-03-30 DIAGNOSIS — Z3480 Encounter for supervision of other normal pregnancy, unspecified trimester: Secondary | ICD-10-CM

## 2013-03-30 DIAGNOSIS — Z3482 Encounter for supervision of other normal pregnancy, second trimester: Secondary | ICD-10-CM

## 2013-03-30 DIAGNOSIS — J45901 Unspecified asthma with (acute) exacerbation: Secondary | ICD-10-CM

## 2013-03-30 DIAGNOSIS — J209 Acute bronchitis, unspecified: Secondary | ICD-10-CM

## 2013-03-30 DIAGNOSIS — J45909 Unspecified asthma, uncomplicated: Secondary | ICD-10-CM | POA: Insufficient documentation

## 2013-03-30 DIAGNOSIS — F17200 Nicotine dependence, unspecified, uncomplicated: Secondary | ICD-10-CM

## 2013-03-30 LAB — POCT URINALYSIS DIPSTICK
Bilirubin, UA: NEGATIVE
Blood, UA: NEGATIVE
Glucose, UA: NEGATIVE
Nitrite, UA: NEGATIVE
Protein, UA: NEGATIVE
Urobilinogen, UA: 0.2

## 2013-03-30 LAB — POCT UA - MICROSCOPIC ONLY: Epithelial cells, urine per micros: >20

## 2013-03-30 MED ORDER — AEROCHAMBER PLUS MISC: Status: DC

## 2013-03-30 MED ORDER — AZITHROMYCIN 250 MG PO TABS: ORAL_TABLET | ORAL | Status: DC

## 2013-03-30 MED ORDER — ALBUTEROL SULFATE HFA 108 (90 BASE) MCG/ACT IN AERS: 2.0000 | INHALATION_SPRAY | Freq: Four times a day (QID) | RESPIRATORY_TRACT | Status: DC | PRN

## 2013-03-30 NOTE — Progress Notes (Signed)
  Ann Brown - 27 y.o. female MRN [id number]  Date of birth: 1985-05-26  CC, HPI, INTERVAL HISTORY & ROS  Ann Brown is here today for same day sick visit in setting of pregnancy. Pt is a 27 y.o. G3P1011 [redacted]w[redacted]d by [place]   She reports similar sx as daughter who is also sick  Cough, congestion, chest pressure  Chills but no objective temperature; night sweats  Worse cough than normal  Nose bleeds.    No ear pressure or change in hearing.  No difficulty swallowing.  Some orthostatic symptoms.  Occasional mild dysuria and questionable increased frequency.  Polydipsia, without polyphagia.  No headaches  History  Past Medical, Surgical, Social, and Family History Reviewed per EMR Medications and Allergies reviewed and all updated if necessary. Objective Findings  VITALS: HR: 96 bpm  BP: 94/55 mmHg  TEMP: 98.1 F (36.7 C) (Oral)  RESP:    HT:    WT: 121 lb 6.4 oz (55.067 kg)  BMI:     BP Readings from Last 3 Encounters:  03/30/13 94/55  03/13/13 104/73  03/06/13 107/62   Wt Readings from Last 3 Encounters:  03/30/13 121 lb 6.4 oz (55.067 kg)  03/13/13 119 lb 11.2 oz (54.296 kg)  03/06/13 125 lb 4.8 oz (56.836 kg)     PHYSICAL EXAM: GENERAL:  adult Caucasian thin pregnant female. In no discomfort; no respiratory distress  PSYCH: alert and appropriate, good insight   HNEENT: H&N: AT/[place], trachea midline  Eyes: no scleral icterus, no conjunctival exudate  Ears:  bilateral tympanic membranes pearly gray without your edema   Nose:  bilateral nasal congestion with minimal exudate   Oropharynx: MMM  Dentention:     CARDIO: RRR, S1/S2 heard, no murmur  LUNGS:  coarse breath sounds throughout, no focal consolidation.  No increased work of breathing   ABDOMEN:  gravid.  Measured 26 cm.  Fetal heart tones 125  EXTREM:  Warm, well perfused.  Moves all 4 extremities spontaneously; no lateralization.  Trace pretibial edema.   GU:   SKIN:    Assessment & Plan   Problems  addressed today: General Plan & Pt Instructions:  1. Supervision of normal subsequent pregnancy, second trimester   2. Asthma   3. TOBACCO USER   4. Acute bronchitis       Repeat Anatomy US as we discussed  Prescription for Azithromycin  Increase Albuterol up to 4 puffs every 4 hours; USE SPACER with each use  Follow up in 8-9 days for your routine OB visit  Check Urine for frequency  STOP SMOKING    For further discussion of A/P and for follow up issues see problem based charting if applicable.

## 2013-03-30 NOTE — Patient Instructions (Signed)
   Repeat Anatomy US as we discussed  Prescription for Azithromycin  Increase Albuterol up to 4 puffs every 4 hours; USE SPACER with each use  Follow up in 8-9 days for your routine OB visit  Check Urine for frequency  STOP SMOKING   If you need anything prior to your next visit please call the clinic. Please Bring all medications or accurate medication list with you to each appointment; an accurate medication list is essential in providing you the best care possible.

## 2013-03-30 NOTE — Assessment & Plan Note (Signed)
Treat with azithromycin given a smoker. Albuterol as above. Tylenol only

## 2013-03-30 NOTE — Assessment & Plan Note (Signed)
Increase to albuterol 4 puffs every 4 hours as needed during acute illness.  Use spacer

## 2013-03-30 NOTE — Assessment & Plan Note (Addendum)
Repeat Anatomy US prior to next visit Return @ 28 weeks for 1 hour Glucola and routine OB VISIT Fetal HR 125 today; Measures 26cm; no discharge or bleeding; minimal RLQ pain; no cramping  Questionable urinary symptoms.  Urinalysis appears to be contaminated. Will send for culture.  Will need additional treatment from azithromycin if urinalysis positive and followup test of cure

## 2013-03-30 NOTE — Assessment & Plan Note (Signed)
Encouraged to follow up with Dr. Raymondo Band for tobacco cessation

## 2013-04-02 LAB — CULTURE, OB URINE: Colony Count: NO GROWTH

## 2013-04-06 ENCOUNTER — Ambulatory Visit (HOSPITAL_COMMUNITY)
Admission: RE | Admit: 2013-04-06 | Discharge: 2013-04-06 | Disposition: A | Discharge status: Home / Self Care | Payer: Medicaid Other | Source: Ambulatory Visit | Attending: Family Medicine | Admitting: Family Medicine

## 2013-04-06 DIAGNOSIS — Z3689 Encounter for other specified antenatal screening: Secondary | ICD-10-CM | POA: Insufficient documentation

## 2013-04-06 DIAGNOSIS — Z3482 Encounter for supervision of other normal pregnancy, second trimester: Secondary | ICD-10-CM

## 2013-04-06 IMAGING — US US OB FOLLOW-UP
1 series · 12 of 28 positions shown · non-contrast
Comparison: none

[Series 1: us ob follow up · 49 acquisitions, 12 frames shown]
[im 2/49]
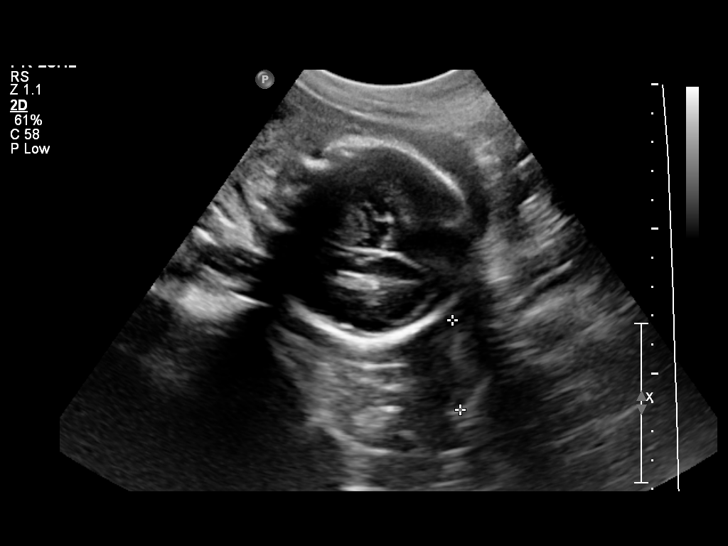
[im 6/49]
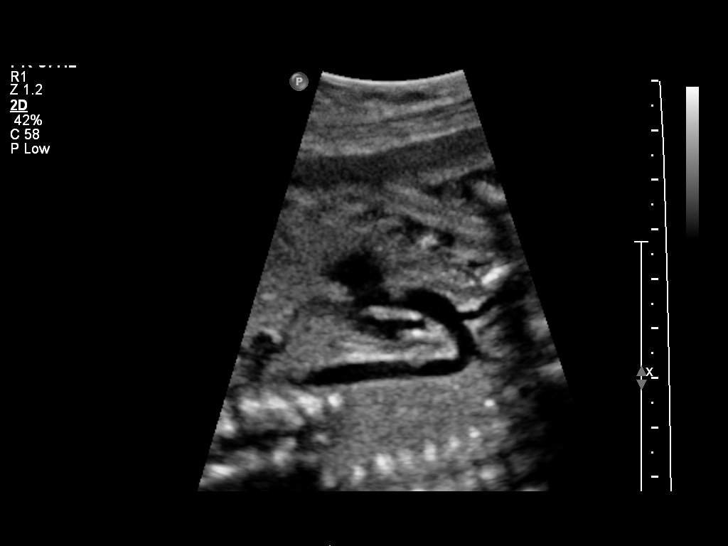
[im 9/49]
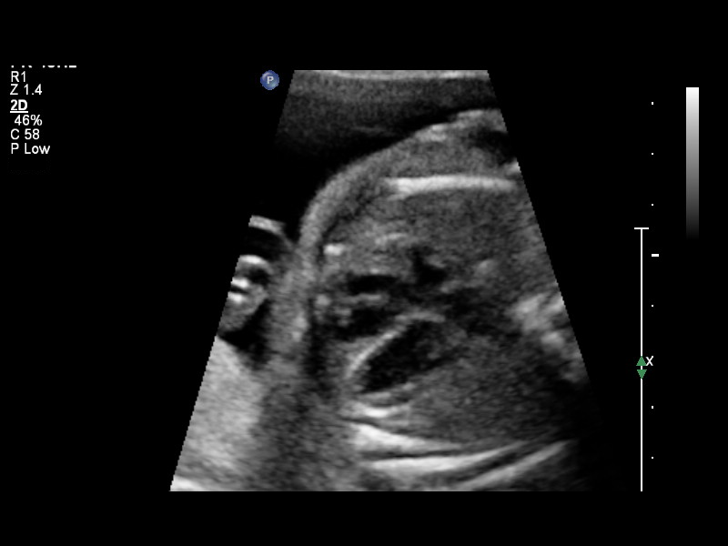
[im 15/49]
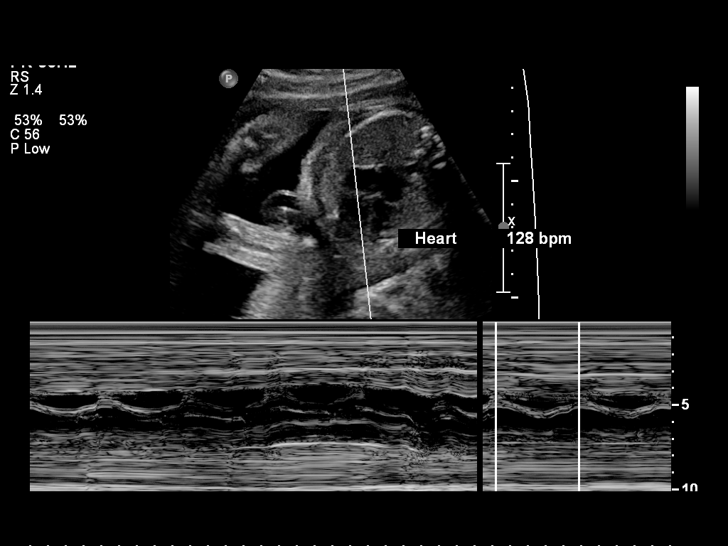
[im 18/49]
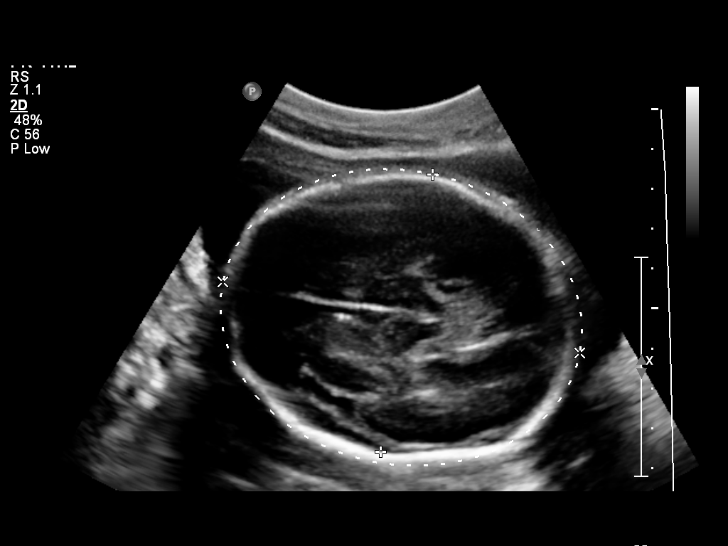
[im 22/49]
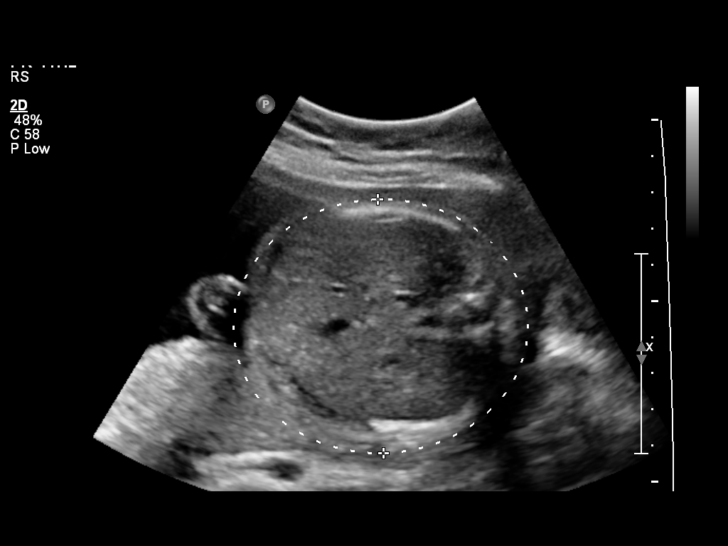
[im 27/49]
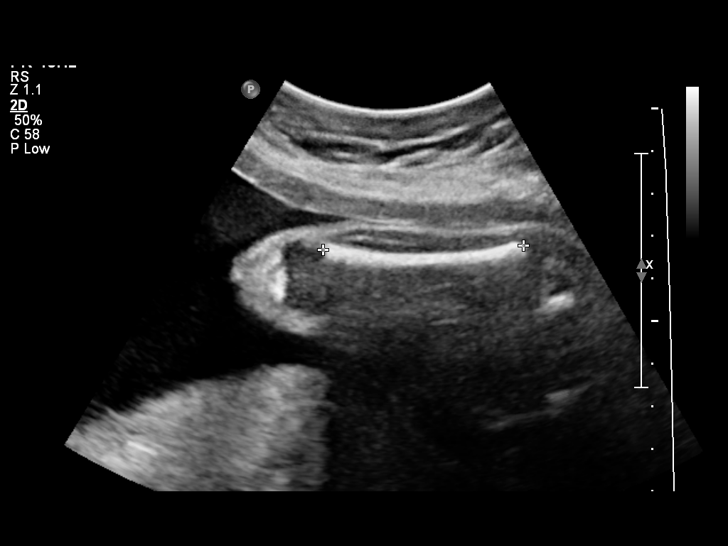
[im 31/49]
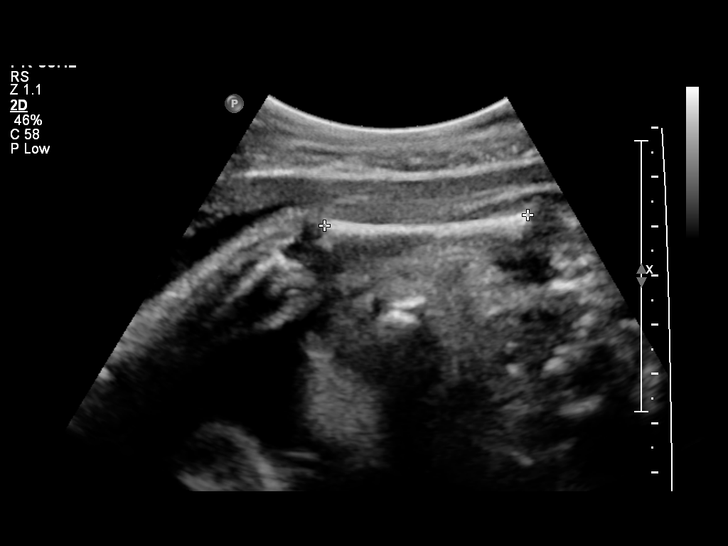
[im 34/49]
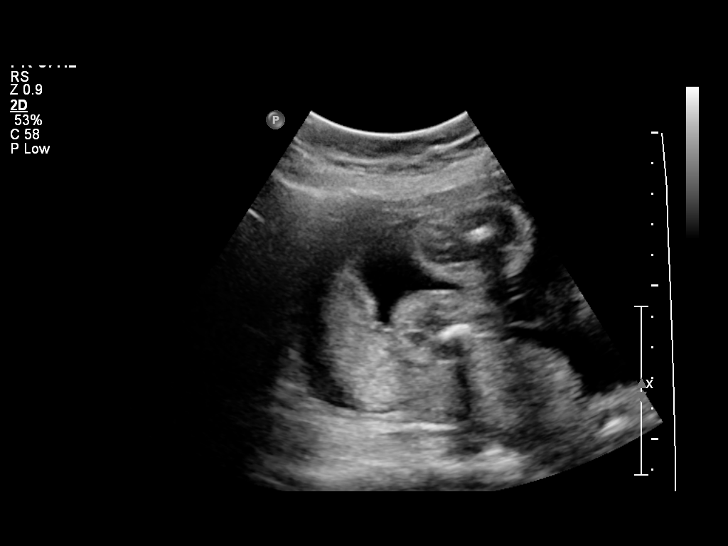
[im 40/49]
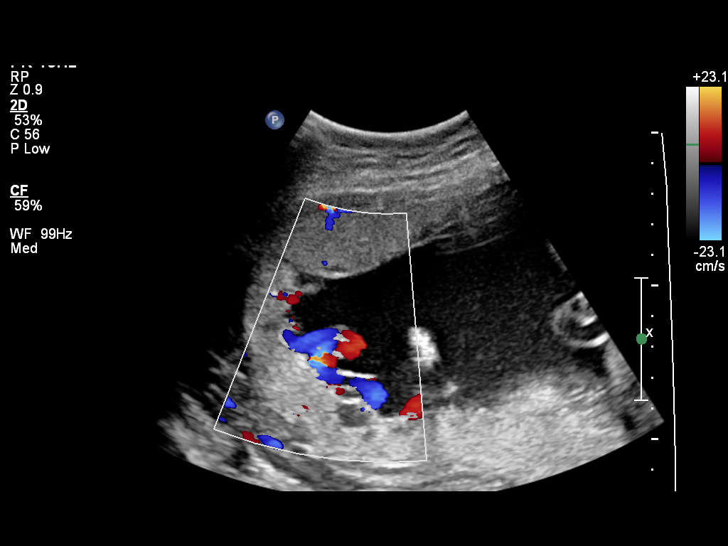
[im 43/49]
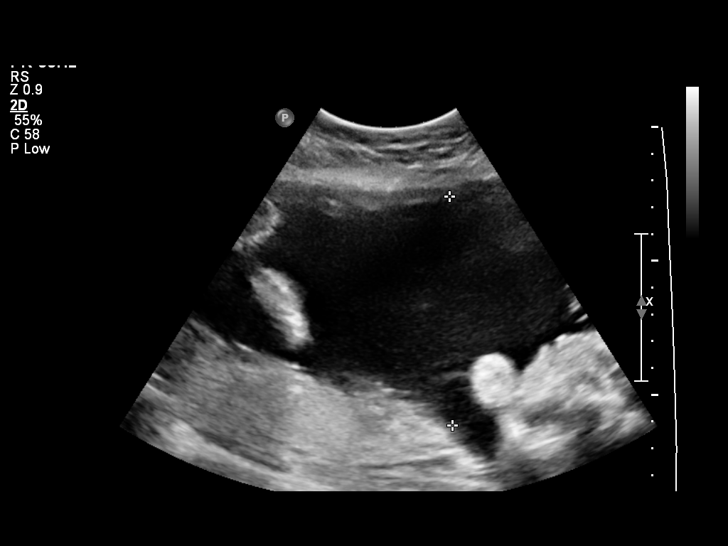
[im 47/49]
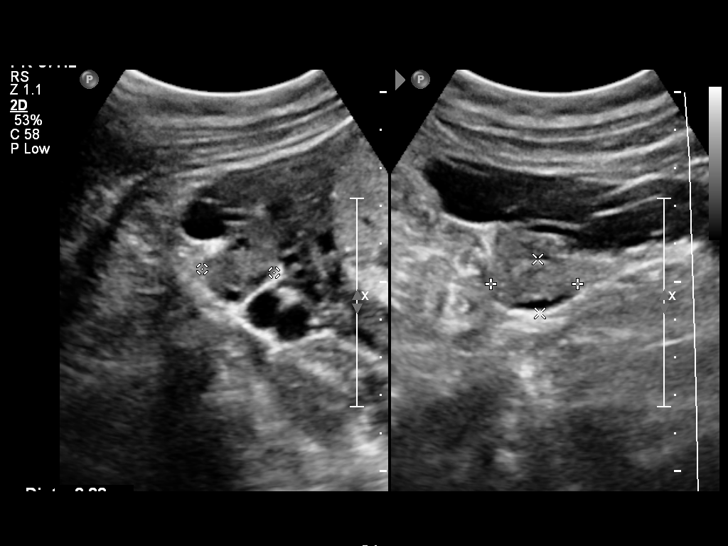

[12 of 28 positions shown; findings below may reference images not displayed]

OBSTETRICS REPORT
                      (Signed Final [DATE] [DATE])

Service(s) Provided

 US OB FOLLOW UP                                       76816.1
Indications

 Follow-up incomplete fetal anatomic evaluation        [PL]
Fetal Evaluation

 Num Of Fetuses:    1
 Fetal Heart Rate:  128                          bpm
 Cardiac Activity:  Observed
 Presentation:      Cephalic
 Placenta:          Posterior Fundal, above
                    cervical os
 P. Cord            Visualized, central
 Insertion:

 Amniotic Fluid
 AFI FV:      Subjectively within normal limits
                                             Larg Pckt:     8.5  cm
Biometry

 BPD:     70.6  mm     G. Age:  28w 2d                CI:        73.57   70 - 86
                                                      FL/HC:      17.7   18.8 -

 HC:     261.5  mm     G. Age:  28w 3d       44  %    HC/AC:      1.09   1.05 -

 AC:     239.5  mm     G. Age:  28w 2d       58  %    FL/BPD:     65.6   71 - 87
 FL:      46.3  mm     G. Age:  25w 3d      < 3  %    FL/AC:      19.3   20 - 24
 HUM:     41.2  mm     G. Age:  25w 0d      < 5  %

 Est. FW:    [PL]  gm      2 lb 5 oz     43  %
Gestational Age

 LMP:           31w 2d        Date:  [DATE]                 EDD:   [DATE]
 U/S Today:     27w 4d                                        EDD:   [DATE]
 Best:          27w 5d     Det. By:  U/S ([DATE])           EDD:   [DATE]
Anatomy

 Cranium:          Appears normal         Aortic Arch:      Appears normal
 Fetal Cavum:      Previously seen        Ductal Arch:      Appears normal
 Ventricles:       Appears normal         Diaphragm:        Previously seen
 Choroid Plexus:   Previously seen        Stomach:          Appears normal, left
                                                            sided
 Cerebellum:       Previously seen        Abdomen:          Appears normal
 Posterior Fossa:  Previously seen        Abdominal Wall:   Previously seen
 Nuchal Fold:      Previously seen        Cord Vessels:     Previously seen
 Face:             Profile previously     Kidneys:          Appear normal
                   seen
 Lips:             Previously seen        Bladder:          Appears normal
 Heart:            Appears normal         Spine:            Previously seen
                   (4CH, axis, and
                   situs)
 RVOT:             Appears normal         Lower             Previously seen
                                          Extremities:
 LVOT:             Appears normal         Upper             Previously seen
                                          Extremities:
Cervix Uterus Adnexa

 Cervical Length:    3.1      cm

 Cervix:       Normal appearance by transabdominal scan.
 Uterus:       No abnormality visualized.
 Cul De Sac:   No free fluid seen.
 Left Ovary:    Not visualized.
 Right Ovary:   Within normal limits.
 Adnexa:     No abnormality visualized.
Impression

 IUP at 27+5 weeks
 Normal interval anatomy; anatomic survey complete
 Normal amniotic fluid volume
 Appropriate interval growth with EFW at the 43rd %tile
Recommendations

 Follow-up as clinically indicated

## 2013-04-09 ENCOUNTER — Ambulatory Visit (INDEPENDENT_AMBULATORY_CARE_PROVIDER_SITE_OTHER): Payer: Medicaid Other | Admitting: Sports Medicine

## 2013-04-09 VITALS — BP 105/66 | Temp 98.1°F | Wt 123.0 lb

## 2013-04-09 DIAGNOSIS — Z3480 Encounter for supervision of other normal pregnancy, unspecified trimester: Secondary | ICD-10-CM

## 2013-04-09 DIAGNOSIS — K219 Gastro-esophageal reflux disease without esophagitis: Secondary | ICD-10-CM

## 2013-04-09 DIAGNOSIS — M9905 Segmental and somatic dysfunction of pelvic region: Secondary | ICD-10-CM | POA: Insufficient documentation

## 2013-04-09 DIAGNOSIS — M999 Biomechanical lesion, unspecified: Secondary | ICD-10-CM

## 2013-04-09 DIAGNOSIS — Z3482 Encounter for supervision of other normal pregnancy, second trimester: Secondary | ICD-10-CM

## 2013-04-09 MED ORDER — PANTOPRAZOLE SODIUM 40 MG PO TBEC: 40.0000 mg | DELAYED_RELEASE_TABLET | Freq: Every day | ORAL | Status: DC

## 2013-04-09 NOTE — Patient Instructions (Signed)
F/u in 2 weeks with the OB clinic See me in 4 weeks

## 2013-04-09 NOTE — Assessment & Plan Note (Signed)
Left anterior innominant; SD pelvis  Decision today to treat with OMT was based on Physical Exam.  Verbal consent was obtained after explanation of risks, benefits and potential side-effects including acute pain flare, post-manipulation soreness, and need for repeat treatments.    The Patient was treated with OB roll techniques in SI areas  Patient tolerated the procedure well with improvement in symptoms.

## 2013-04-09 NOTE — Progress Notes (Signed)
Pt is a 27 y.o. G3P1011 [redacted]w[redacted]d by [place] who presents today for routine prenatal care. Reflux was slightly better but has returned. B Low back pain; no radiculopathy Still smoking 1ppd; encouraged to quit 1 hour Glucola today F/u in 2 weeks with OB clinic; 4 weeks with me

## 2013-04-09 NOTE — Assessment & Plan Note (Signed)
Zantac not helping as much. Start Protonix

## 2013-04-14 ENCOUNTER — Other Ambulatory Visit (INDEPENDENT_AMBULATORY_CARE_PROVIDER_SITE_OTHER): Payer: Medicaid Other

## 2013-04-14 DIAGNOSIS — Z331 Pregnant state, incidental: Secondary | ICD-10-CM

## 2013-04-14 LAB — GLUCOSE, CAPILLARY: Glucose-Capillary: 83 mg/dL (ref 70–99)

## 2013-04-14 NOTE — Progress Notes (Signed)
3 HR GTT DONE TODAY Ann Brown 

## 2013-04-15 LAB — GLUCOSE TOLERANCE, 3 HOURS
Glucose Tolerance, 1 hour: 154 mg/dL (ref 70–189)
Glucose Tolerance, 2 hour: 163 mg/dL (ref 70–164)
Glucose, GTT - 3 Hour: 116 mg/dL (ref 70–144)

## 2013-04-23 NOTE — L&D Delivery Note (Signed)
Delivery Note At 8:17 PM a viable female was delivered via Vaginal, Spontaneous Delivery (Presentation: ;  ).  APGAR: , ; weight .   Placenta status: Intact, Spontaneous.  Cord: 3 vessels with the following complications: None.  Cord pH: n/a  Anesthesia: Epidural  Episiotomy: None Lacerations: Perineal;1st degree Suture Repair: 3.0 vicryl rapide Est. Blood Loss (mL): 300  Mom to postpartum.  Baby to Couplet care / Skin to Skin.   [name], DO [facility] Res[facility] - PGY-3 06/29/2013 8:35 PM   i was present for the entire delivery. Dr. Berline Choughigby delivered a NSVD over 1st degree tear. Managed third stage with traction. Pit given after placenta. Repaired with figure of 8 on a monocryl CT 3.0. Hemostatic, counts correct. EBL 300.  Tawana ScaleMichael Ryan Hillary Schwegler, MD OB Fellow

## 2013-04-30 ENCOUNTER — Ambulatory Visit (INDEPENDENT_AMBULATORY_CARE_PROVIDER_SITE_OTHER): Payer: Medicaid Other | Admitting: Family Medicine

## 2013-04-30 VITALS — BP 115/75 | Wt 128.3 lb

## 2013-04-30 DIAGNOSIS — J45909 Unspecified asthma, uncomplicated: Secondary | ICD-10-CM

## 2013-04-30 DIAGNOSIS — Z23 Encounter for immunization: Secondary | ICD-10-CM

## 2013-04-30 MED ORDER — FLUTICASONE-SALMETEROL 100-50 MCG/DOSE IN AEPB: 1.0000 | INHALATION_SPRAY | Freq: Two times a day (BID) | RESPIRATORY_TRACT | Status: DC

## 2013-04-30 NOTE — Patient Instructions (Addendum)
It was a [facility] to see you today in [facility]! Your pregnancy is currently 31 weeks and 1 day, with an estimated delivery date 07/01/13. We have sent the prescription to your pharmacy for Advair Inhaler, please use this every day, 1 puff in morning and 1 puff at night. You may resume the Protonix daily.  Follow-up Appointments:  Currently scheduled - OB Follow-up with Dr. Berline Choughigby (05/07/13)  Please schedule a OB Follow-up with Dr. Berline Choughigby (PCP) 2 weeks after your next appointment (05/07/13).    Fetal Movement Counts Patient Name: __________________________________________________ Patient Due Date: ____________________ Performing a fetal movement count is highly recommended in high-risk pregnancies, but it is good for every pregnant woman to do. Your caregiver may ask you to start counting fetal movements at 28 weeks of the pregnancy. Fetal movements often increase:  After eating a full meal.  After physical activity.  After eating or drinking something sweet or cold.  At rest. Pay attention to when you feel the baby is most active. This will help you notice a pattern of your baby's sleep and wake cycles and what factors contribute to an increase in fetal movement. It is important to perform a fetal movement count at the same time each day when your baby is normally most active.  HOW TO COUNT FETAL MOVEMENTS 1. Find a quiet and comfortable area to sit or lie down on your left side. Lying on your left side provides the best blood and oxygen circulation to your baby. 2. Write down the day and time on a sheet of paper or in a journal. 3. Start counting kicks, flutters, swishes, rolls, or jabs in a 2 hour period. You should feel at least 10 movements within 2 hours. 4. If you do not feel 10 movements in 2 hours, wait 2 3 hours and count again. Look for a change in the pattern or not enough counts in 2 hours. SEEK MEDICAL CARE IF:  You feel less than 10 counts in 2 hours, tried twice.  There  is no movement in over an hour.  The pattern is changing or taking longer each day to reach 10 counts in 2 hours.  You feel the baby is not moving as he or she usually does.  Date: ____________ Movements: ____________ Start time: ____________ Doreatha MartinFinish time: ____________  Date: ____________ Movements: ____________ Start time: ____________ Doreatha MartinFinish time: ____________ Date: ____________ Movements: ____________ Start time: ____________ Doreatha MartinFinish time: ____________  Document Released: 05/09/2006 Document Revised: 03/26/2012 Document Reviewed: 02/04/2012 [employer] Patient Information 2014 [place].

## 2013-04-30 NOTE — Progress Notes (Signed)
[facility], at 5740w1d EGA by first trimester US.  She reports some increased use of her albuterol HFA for asthma since beginning pregnancy, had previously been controlled on Advair 100/50 twice daily. Triggers are exercise.  She continues to smoke 1ppd, mother lives with her and is also a smoker.  Discussed smoking cessation.  Has had worsening heartburn.  Took single tablet of prilosec, had generalized muscle aches that she associated and discontinued the med.  Zantac not helping for heartburn.  Feels daily FM; no contractions, no LOF, no vaginal bleeding/discharge.  Reports some low back pain, worse as she gets farther along in pregnancy. History of GDM in past pregnancy managed with oral medications and diet; we reviewed the results of her recent 3hGTT which she passed.  PMH Risk Assessment Tool is reviewed and is significant for "unstable living environment". PHQ9 score 4 (3 pts Q#7; 1 pt Q#4).   ExaM: Well appearing, no apparent distress. NEck supple.  PULM Clear bilaterally, no wheezes or rales.  COR Regular S1S2 Gravid uterus, measuring 30cm at fundus.  FHR 145bpm.  EXTS: trace ankle edema bilaterally, no calf tenderness.   A/P: Patient at 5440w1d, discussed S/Sx of PTL, as well as kick counts.   Asthma, chronic intermittent.  She is counseled to resume her Advair 100/50 one puff twice daily for control, use Albuterol only as rescue.  Discussed smoking cessation and its importance for her and her baby. Counseled to use PPI one time daily on regular basis; small regular snacks rather than 3 large square meals a day.  Smoking cessation may help with this as well.  Tedds hose for varicose veins.  Heating pad to back, pregnancy belt for uterine support which may help with round ligament pain and low back pain.  Submitted request for pregnancy care management from Medicaid on PMH form due to reported "unstable living environment".   Tdap given today (recommended after [redacted] weeks  EGA).  She has follow up with Dr Berline Choughigby on Jan 15, to schedule her next appointment 2 weeks after that date.  Paula ComptonJames Nevaeha Finerty, MD

## 2013-05-07 ENCOUNTER — Ambulatory Visit (INDEPENDENT_AMBULATORY_CARE_PROVIDER_SITE_OTHER): Payer: Medicaid Other | Admitting: Sports Medicine

## 2013-05-07 VITALS — BP 81/69 | Temp 98.1°F | Wt 127.0 lb

## 2013-05-07 DIAGNOSIS — M999 Biomechanical lesion, unspecified: Secondary | ICD-10-CM

## 2013-05-07 DIAGNOSIS — M9905 Segmental and somatic dysfunction of pelvic region: Secondary | ICD-10-CM

## 2013-05-07 DIAGNOSIS — K219 Gastro-esophageal reflux disease without esophagitis: Secondary | ICD-10-CM

## 2013-05-07 DIAGNOSIS — F17200 Nicotine dependence, unspecified, uncomplicated: Secondary | ICD-10-CM

## 2013-05-07 DIAGNOSIS — J45909 Unspecified asthma, uncomplicated: Secondary | ICD-10-CM

## 2013-05-07 MED ORDER — ESOMEPRAZOLE MAGNESIUM 40 MG PO CPDR: 40.0000 mg | DELAYED_RELEASE_CAPSULE | Freq: Every day | ORAL | Status: DC

## 2013-05-07 NOTE — Progress Notes (Signed)
Pt is a 28 y.o. G3P1011 9035w1d by US who presents today for routine prenatal care. Trial nexium for continued reflux.

## 2013-05-07 NOTE — Progress Notes (Signed)
Pt is a 28 y.o. G3P1011 2712w1d by first trimester US who presents today for routine care. Reflux is persistent. Using Advair only once per day; albuterol being used every 4 hours at nighttime see problem based charting Continues to smoke but reports cutting back. Continues to have back pain and occasional suprapubic pressure when changing positions.  OMT previously helpful. Continue prenatal vitamin.  Followup in 2 weeks for continued prenatal care.

## 2013-05-07 NOTE — Assessment & Plan Note (Signed)
Quit smoking. 

## 2013-05-07 NOTE — Assessment & Plan Note (Signed)
Not taking advair BID.  Instructed to increase to twice daily. Albuterol use still high at night due to wheezing. Continues to smoke

## 2013-05-07 NOTE — Assessment & Plan Note (Signed)
Change to nexium as previous adverse effect to protonix and nexium helpful in past

## 2013-05-07 NOTE — Assessment & Plan Note (Signed)
R anterior innominant with LonL sacrum. Decision today to treat with OMT was based on Physical Exam.  Verbal consent was obtained after explanation of risks, benefits and potential side-effects including acute pain flare, post-manipulation soreness, and need for repeat treatments.    The Patient was treated with OB roll.   Patient tolerated the procedure well with improvement in symptoms.  Patient given medications, exercises, stretches and lifestyle modifications per AVS and verbally.

## 2013-05-08 ENCOUNTER — Telehealth: Payer: Self-pay | Admitting: *Deleted

## 2013-05-08 NOTE — Telephone Encounter (Signed)
Received fax from [facility] requesting prior authorization of Nexium .  Form placed in MD's box for completion along with Medicaid formulary.  Gaylene Brooksichardson, Jaleia Hanke Ann, RN

## 2013-05-11 NOTE — Telephone Encounter (Signed)
Completed and returned to LawlerAsha. Thanks.

## 2013-05-12 NOTE — Telephone Encounter (Signed)
Prior approval for Nexiuim completed via [place] [employer].  Med approved for 05/12/13 - 11/08/13  Prior approval # [id number].  [facility] pharmacy informed.  Gaylene Brooksichardson, Jeannette Ann, RN

## 2013-05-14 ENCOUNTER — Telehealth: Payer: Self-pay | Admitting: *Deleted

## 2013-05-14 NOTE — Telephone Encounter (Signed)
Prior Authorization received from [facility] pharmacy for Nexium DR 40 mg capsule. Formulary and PA form placed in provider box for completion. Clovis PuMartin, Tamika L, RN

## 2013-05-15 NOTE — Telephone Encounter (Signed)
[facility]  Please see prior phone note.

## 2013-05-22 ENCOUNTER — Ambulatory Visit (INDEPENDENT_AMBULATORY_CARE_PROVIDER_SITE_OTHER): Payer: Medicaid Other | Admitting: Sports Medicine

## 2013-05-22 VITALS — BP 117/66 | Temp 97.9°F | Wt 126.8 lb

## 2013-05-22 DIAGNOSIS — Z3480 Encounter for supervision of other normal pregnancy, unspecified trimester: Secondary | ICD-10-CM

## 2013-05-22 NOTE — Progress Notes (Signed)
Pt is a 28 y.o. G3P1011 6539w2d by US who presents today for routine prenatal care Improved reflux on Nexium 1 episode of cramping but no bleeding Good baby movement Down to 1/2 ppd  Updated preg overview F/u 2 weeks for GBS and cultures

## 2013-05-22 NOTE — Patient Instructions (Signed)
Levonorgestrel intrauterine device (IUD) What is this medicine? LEVONORGESTREL IUD (LEE voe nor jes trel) is a contraceptive (birth control) device. The device is placed inside the uterus by a healthcare professional. It is used to prevent pregnancy and can also be used to treat heavy bleeding that occurs during your period. Depending on the device, it can be used for 3 to 5 years. This medicine may be used for other purposes; ask your health care provider or pharmacist if you have questions. COMMON BRAND NAME(S): Mirena, Skyla What should I tell my health care provider before I take this medicine? They need to know if you have any of these conditions: -abnormal Pap smear -cancer of the breast, uterus, or cervix -diabetes -endometritis -genital or pelvic infection now or in the past -have more than one sexual partner or your partner has more than one partner -heart disease -history of an ectopic or tubal pregnancy -immune system problems -IUD in place -liver disease or tumor -problems with blood clots or take blood-thinners -use intravenous drugs -uterus of unusual shape -vaginal bleeding that has not been explained -an unusual or allergic reaction to levonorgestrel, other hormones, silicone, or polyethylene, medicines, foods, dyes, or preservatives -pregnant or trying to get pregnant -breast-feeding How should I use this medicine? This device is placed inside the uterus by a health care professional. Talk to your pediatrician regarding the use of this medicine in children. Special care may be needed. Overdosage: If you think you have taken too much of this medicine contact a poison control center or emergency room at once. NOTE: This medicine is only for you. Do not share this medicine with others. What if I miss a dose? This does not apply. What may interact with this medicine? Do not take this medicine with any of the following  medications: -amprenavir -bosentan -fosamprenavir This medicine may also interact with the following medications: -aprepitant -barbiturate medicines for inducing sleep or treating seizures -bexarotene -griseofulvin -medicines to treat seizures like carbamazepine, ethotoin, felbamate, oxcarbazepine, phenytoin, topiramate -modafinil -pioglitazone -rifabutin -rifampin -rifapentine -some medicines to treat HIV infection like atazanavir, indinavir, lopinavir, nelfinavir, tipranavir, ritonavir -St. John's wort -warfarin This list may not describe all possible interactions. Give your health care provider a list of all the medicines, herbs, non-prescription drugs, or dietary supplements you use. Also tell them if you smoke, drink alcohol, or use illegal drugs. Some items may interact with your medicine. What should I watch for while using this medicine? Visit your doctor or health care professional for regular check ups. See your doctor if you or your partner has sexual contact with others, becomes HIV positive, or gets a sexual transmitted disease. This product does not protect you against HIV infection (AIDS) or other sexually transmitted diseases. You can check the placement of the IUD yourself by reaching up to the top of your vagina with clean fingers to feel the threads. Do not pull on the threads. It is a good habit to check placement after each menstrual period. Call your doctor right away if you feel more of the IUD than just the threads or if you cannot feel the threads at all. The IUD may come out by itself. You may become pregnant if the device comes out. If you notice that the IUD has come out use a backup birth control method like condoms and call your health care provider. Using tampons will not change the position of the IUD and are okay to use during your period. What side effects may I   notice from receiving this medicine? Side effects that you should report to your doctor or  health care professional as soon as possible: -allergic reactions like skin rash, itching or hives, swelling of the face, lips, or tongue -fever, flu-like symptoms -genital sores -high blood pressure -no menstrual period for 6 weeks during use -pain, swelling, warmth in the leg -pelvic pain or tenderness -severe or sudden headache -signs of pregnancy -stomach cramping -sudden shortness of breath -trouble with balance, talking, or walking -unusual vaginal bleeding, discharge -yellowing of the eyes or skin Side effects that usually do not require medical attention (report to your doctor or health care professional if they continue or are bothersome): -acne -breast pain -change in sex drive or performance -changes in weight -cramping, dizziness, or faintness while the device is being inserted -headache -irregular menstrual bleeding within first 3 to 6 months of use -nausea This list may not describe all possible side effects. Call your doctor for medical advice about side effects. You may report side effects to FDA at 1-800-FDA-1088. Where should I keep my medicine? This does not apply. NOTE: This sheet is a summary. It may not cover all possible information. If you have questions about this medicine, talk to your doctor, pharmacist, or health care provider.  2014, [employer] Standard. (2011-05-10 13:54:04)  

## 2013-06-08 ENCOUNTER — Encounter: Payer: Medicaid Other | Admitting: Sports Medicine

## 2013-06-11 ENCOUNTER — Ambulatory Visit (INDEPENDENT_AMBULATORY_CARE_PROVIDER_SITE_OTHER): Payer: Medicaid Other | Admitting: Family Medicine

## 2013-06-11 VITALS — BP 128/70 | Temp 97.9°F | Wt 136.0 lb

## 2013-06-11 DIAGNOSIS — Z331 Pregnant state, incidental: Secondary | ICD-10-CM

## 2013-06-11 NOTE — Patient Instructions (Signed)
Things look good today.  Come back in 1 week for a recheck to make sure things are going well.  If you have any fluid leakage or loss, any regular cramping/contractions, any bleeding please come back immediately or [facility].  For the swelling, there's not much to do besides keeping your legs up like you have been!  Third Trimester of Pregnancy The third trimester is from week 29 through week 42, months 7 through 9. The third trimester is a time when the fetus is growing rapidly. At the end of the ninth month, the fetus is about 20 inches in length and weighs 6 10 pounds.  BODY CHANGES Your body goes through many changes during pregnancy. The changes vary from woman to woman.   Your weight will continue to increase. You can expect to gain 25 35 pounds (11 16 kg) by the end of the pregnancy.  You may begin to get stretch marks on your hips, abdomen, and breasts.  You may urinate more often because the fetus is moving lower into your pelvis and pressing on your bladder.  You may develop or continue to have heartburn as a result of your pregnancy.  You may develop constipation because certain hormones are causing the muscles that push waste through your intestines to slow down.  You may develop hemorrhoids or swollen, bulging veins (varicose veins).  You may have pelvic pain because of the weight gain and pregnancy hormones relaxing your joints between the bones in your pelvis. Back aches may result from over exertion of the muscles supporting your posture.  Your breasts will continue to grow and be tender. A yellow discharge may leak from your breasts called colostrum.  Your belly button may stick out.  You may feel short of breath because of your expanding uterus.  You may notice the fetus "dropping," or moving lower in your abdomen.  You may have a bloody mucus discharge. This usually occurs a few days to a week before labor begins.  Your cervix becomes  thin and soft (effaced) near your due date. WHAT TO EXPECT AT YOUR PRENATAL EXAMS  You will have prenatal exams every 2 weeks until week 36. Then, you will have weekly prenatal exams. During a routine prenatal visit:  You will be weighed to make sure you and the fetus are growing normally.  Your blood pressure is taken.  Your abdomen will be measured to track your baby's growth.  The fetal heartbeat will be listened to.  Any test results from the previous visit will be discussed.  You may have a cervical check near your due date to see if you have effaced. At around 36 weeks, your caregiver will check your cervix. At the same time, your caregiver will also perform a test on the secretions of the vaginal tissue. This test is to determine if a type of bacteria, Group B streptococcus, is present. Your caregiver will explain this further. Your caregiver may ask you:  What your birth plan is.  How you are feeling.  If you are feeling the baby move.  If you have had any abnormal symptoms, such as leaking fluid, bleeding, severe headaches, or abdominal cramping.  If you have any questions. Other tests or screenings that may be performed during your third trimester include:  Blood tests that check for low iron levels (anemia).  Fetal testing to check the health, activity level, and growth of the fetus. Testing is done if you have certain  medical conditions or if there are problems during the pregnancy. FALSE LABOR You may feel small, irregular contractions that eventually go away. These are called Braxton Hicks contractions, or false labor. Contractions may last for hours, days, or even weeks before true labor sets in. If contractions come at regular intervals, intensify, or become painful, it is best to be seen by your caregiver.  SIGNS OF LABOR   Menstrual-like cramps.  Contractions that are 5 minutes apart or less.  Contractions that start on the top of the uterus and spread down  to the lower abdomen and back.  A sense of increased pelvic pressure or back pain.  A watery or bloody mucus discharge that comes from the vagina. If you have any of these signs before the 37th week of pregnancy, call your caregiver right away. You need to go to the hospital to get checked immediately. HOME CARE INSTRUCTIONS   Avoid all smoking, herbs, alcohol, and unprescribed drugs. These chemicals affect the formation and growth of the baby.  Follow your caregiver's instructions regarding medicine use. There are medicines that are either safe or unsafe to take during pregnancy.  Exercise only as directed by your caregiver. Experiencing uterine cramps is a good sign to stop exercising.  Continue to eat regular, healthy meals.  Wear a good support bra for breast tenderness.  Do not use hot tubs, steam rooms, or saunas.  Wear your seat belt at all times when driving.  Avoid raw meat, uncooked cheese, cat litter boxes, and soil used by cats. These carry germs that can cause birth defects in the baby.  Take your prenatal vitamins.  Try taking a stool softener (if your caregiver approves) if you develop constipation. Eat more high-fiber foods, such as fresh vegetables or fruit and whole grains. Drink plenty of fluids to keep your urine clear or pale yellow.  Take warm sitz baths to soothe any pain or discomfort caused by hemorrhoids. Use hemorrhoid cream if your caregiver approves.  If you develop varicose veins, wear support hose. Elevate your feet for 15 minutes, 3 4 times a day. Limit salt in your diet.  Avoid heavy lifting, wear low heal shoes, and practice good posture.  Rest a lot with your legs elevated if you have leg cramps or low back pain.  Visit your dentist if you have not gone during your pregnancy. Use a soft toothbrush to brush your teeth and be gentle when you floss.  A sexual relationship may be continued unless your caregiver directs you otherwise.  Do not  travel far distances unless it is absolutely necessary and only with the approval of your caregiver.  Take prenatal classes to understand, practice, and ask questions about the labor and delivery.  Make a trial run to the hospital.  Pack your hospital bag.  Prepare the baby's nursery.  Continue to go to all your prenatal visits as directed by your caregiver. SEEK MEDICAL CARE IF:  You are unsure if you are in labor or if your water has broken.  You have dizziness.  You have mild pelvic cramps, pelvic pressure, or nagging pain in your abdominal area.  You have persistent nausea, vomiting, or diarrhea.  You have a bad smelling vaginal discharge.  You have pain with urination. SEEK IMMEDIATE MEDICAL CARE IF:   You have a fever.  You are leaking fluid from your vagina.  You have spotting or bleeding from your vagina.  You have severe abdominal cramping or pain.  You have rapid  weight loss or gain.  You have shortness of breath with chest pain.  You notice sudden or extreme swelling of your face, hands, ankles, feet, or legs.  You have not felt your baby move in over an hour.  You have severe headaches that do not go away with medicine.  You have vision changes. Document Released: 04/03/2001 Document Revised: 12/10/2012 Document Reviewed: 06/10/2012 [facility] Patient Information 2014 [place], [place].

## 2013-06-13 LAB — STREP B DNA PROBE: STREP GROUP B AG: POSITIVE

## 2013-06-15 ENCOUNTER — Encounter: Payer: Self-pay | Admitting: Sports Medicine

## 2013-06-15 NOTE — Progress Notes (Signed)
Pt is a 28 y.o. G3P1011 36w by US who presents today for routine prenatal care Some pain at times, no real cramping. Main complaint today is LE edema.  No headaches or vision changes, BP good today.  TO continue keeping feet elevated.  Good baby movement Continues to smoke.   GBS and cultures today Return precautions noted and written

## 2013-06-18 ENCOUNTER — Encounter: Payer: Medicaid Other | Admitting: Family Medicine

## 2013-06-23 ENCOUNTER — Ambulatory Visit (INDEPENDENT_AMBULATORY_CARE_PROVIDER_SITE_OTHER): Payer: Medicaid Other | Admitting: Family Medicine

## 2013-06-23 ENCOUNTER — Encounter: Payer: Medicaid Other | Admitting: Family Medicine

## 2013-06-23 VITALS — BP 115/76 | Temp 97.9°F | Wt 141.0 lb

## 2013-06-23 DIAGNOSIS — Z331 Pregnant state, incidental: Secondary | ICD-10-CM

## 2013-06-23 DIAGNOSIS — Z3493 Encounter for supervision of normal pregnancy, unspecified, third trimester: Secondary | ICD-10-CM

## 2013-06-23 NOTE — Patient Instructions (Signed)
You are doing well overall. I am a little concerned about the lack of growth in  Your uterus over the past couple of weeks so please go get your ultrasound.  Please come back in 1 week to see either myself or Dr. Berline Choughigby.  Please call anytime with further concerns  Fetal Movement Counts Patient Name: __________________________________________________ Patient Due Date: ____________________ Performing a fetal movement count is highly recommended in high-risk pregnancies, but it is good for every pregnant woman to do. Your caregiver may ask you to start counting fetal movements at 28 weeks of the pregnancy. Fetal movements often increase:  After eating a full meal.  After physical activity.  After eating or drinking something sweet or cold.  At rest. Pay attention to when you feel the baby is most active. This will help you notice a pattern of your baby's sleep and wake cycles and what factors contribute to an increase in fetal movement. It is important to perform a fetal movement count at the same time each day when your baby is normally most active.  HOW TO COUNT FETAL MOVEMENTS 1. Find a quiet and comfortable area to sit or lie down on your left side. Lying on your left side provides the best blood and oxygen circulation to your baby. 2. Write down the day and time on a sheet of paper or in a journal. 3. Start counting kicks, flutters, swishes, rolls, or jabs in a 2 hour period. You should feel at least 10 movements within 2 hours. 4. If you do not feel 10 movements in 2 hours, wait 2 3 hours and count again. Look for a change in the pattern or not enough counts in 2 hours. SEEK MEDICAL CARE IF:  You feel less than 10 counts in 2 hours, tried twice.  There is no movement in over an hour.  The pattern is changing or taking longer each day to reach 10 counts in 2 hours.  You feel the baby is not moving as he or she usually does. Date: ____________ Movements: ____________ Start time:  ____________ Ann MartinFinish time: ____________  Date: ____________ Movements: ____________ Start time: ____________ Ann MartinFinish time: ____________ Date: ____________ Movements: ____________ Start time: ____________ Ann MartinFinish time: ____________ Date: ____________ Movements: ____________ Start time: ____________ Ann MartinFinish time: ____________ Date: ____________ Movements: ____________ Start time: ____________ Ann MartinFinish time: ____________ Date: ____________ Movements: ____________ Start time: ____________ Ann MartinFinish time: ____________ Date: ____________ Movements: ____________ Start time: ____________ Ann MartinFinish time: ____________ Date: ____________ Movements: ____________ Start time: ____________ Ann MartinFinish time: ____________  Date: ____________ Movements: ____________ Start time: ____________ Ann MartinFinish time: ____________ Date: ____________ Movements: ____________ Start time: ____________ Ann MartinFinish time: ____________ Date: ____________ Movements: ____________ Start time: ____________ Ann MartinFinish time: ____________ Date: ____________ Movements: ____________ Start time: ____________ Ann MartinFinish time: ____________ Date: ____________ Movements: ____________ Start time: ____________ Ann MartinFinish time: ____________ Date: ____________ Movements: ____________ Start time: ____________ Ann MartinFinish time: ____________ Date: ____________ Movements: ____________ Start time: ____________ Ann MartinFinish time: ____________  Date: ____________ Movements: ____________ Start time: ____________ Ann MartinFinish time: ____________ Date: ____________ Movements: ____________ Start time: ____________ Ann MartinFinish time: ____________ Date: ____________ Movements: ____________ Start time: ____________ Ann MartinFinish time: ____________ Date: ____________ Movements: ____________ Start time: ____________ Ann MartinFinish time: ____________ Date: ____________ Movements: ____________ Start time: ____________ Ann MartinFinish time: ____________ Date: ____________ Movements: ____________ Start time: ____________ Ann MartinFinish time: ____________ Date:  ____________ Movements: ____________ Start time: ____________ Ann MartinFinish time: ____________  Date: ____________ Movements: ____________ Start time: ____________ Ann MartinFinish time: ____________ Date: ____________ Movements: ____________ Start time: ____________ Ann MartinFinish time: ____________ Date: ____________ Movements: ____________ Start time:  ____________ Ann Brown time: ____________ Date: ____________ Movements: ____________ Start time: ____________ Ann Brown time: ____________ Date: ____________ Movements: ____________ Start time: ____________ Ann Brown time: ____________ Date: ____________ Movements: ____________ Start time: ____________ Ann Brown time: ____________ Date: ____________ Movements: ____________ Start time: ____________ Ann Brown time: ____________  Date: ____________ Movements: ____________ Start time: ____________ Ann Brown time: ____________ Date: ____________ Movements: ____________ Start time: ____________ Ann Brown time: ____________ Date: ____________ Movements: ____________ Start time: ____________ Ann Brown time: ____________ Date: ____________ Movements: ____________ Start time: ____________ Ann Brown time: ____________ Date: ____________ Movements: ____________ Start time: ____________ Ann Brown time: ____________ Date: ____________ Movements: ____________ Start time: ____________ Ann Brown time: ____________ Date: ____________ Movements: ____________ Start time: ____________ Ann Brown time: ____________  Date: ____________ Movements: ____________ Start time: ____________ Ann Brown time: ____________ Date: ____________ Movements: ____________ Start time: ____________ Ann Brown time: ____________ Date: ____________ Movements: ____________ Start time: ____________ Ann Brown time: ____________ Date: ____________ Movements: ____________ Start time: ____________ Ann Brown time: ____________ Date: ____________ Movements: ____________ Start time: ____________ Ann Brown time: ____________ Date: ____________ Movements: ____________ Start  time: ____________ Ann Brown time: ____________ Date: ____________ Movements: ____________ Start time: ____________ Ann Brown time: ____________  Date: ____________ Movements: ____________ Start time: ____________ Ann Brown time: ____________ Date: ____________ Movements: ____________ Start time: ____________ Ann Brown time: ____________ Date: ____________ Movements: ____________ Start time: ____________ Ann Brown time: ____________ Date: ____________ Movements: ____________ Start time: ____________ Ann Brown time: ____________ Date: ____________ Movements: ____________ Start time: ____________ Ann Brown time: ____________ Date: ____________ Movements: ____________ Start time: ____________ Ann Brown time: ____________ Date: ____________ Movements: ____________ Start time: ____________ Ann Brown time: ____________  Date: ____________ Movements: ____________ Start time: ____________ Ann Brown time: ____________ Date: ____________ Movements: ____________ Start time: ____________ Ann Brown time: ____________ Date: ____________ Movements: ____________ Start time: ____________ Ann Brown time: ____________ Date: ____________ Movements: ____________ Start time: ____________ Ann Brown time: ____________ Date: ____________ Movements: ____________ Start time: ____________ Ann Brown time: ____________ Date: ____________ Movements: ____________ Start time: ____________ Ann Brown time: ____________ Document Released: 05/09/2006 Document Revised: 03/26/2012 Document Reviewed: 02/04/2012 [employer] Patient Information 2014 [employer] Third Trimester of Pregnancy The third trimester is from week 29 through week 42, months 7 through 9. The third trimester is a time when the fetus is growing rapidly. At the end of the ninth month, the fetus is about 20 inches in length and weighs 6 10 pounds.  BODY CHANGES Your body goes through many changes during pregnancy. The changes vary from woman to woman.   Your weight will continue to increase. You can expect  to gain 25 35 pounds (11 16 kg) by the end of the pregnancy.  You may begin to get stretch marks on your hips, abdomen, and breasts.  You may urinate more often because the fetus is moving lower into your pelvis and pressing on your bladder.  You may develop or continue to have heartburn as a result of your pregnancy.  You may develop constipation because certain hormones are causing the muscles that push waste through your intestines to slow down.  You may develop hemorrhoids or swollen, bulging veins (varicose veins).  You may have pelvic pain because of the weight gain and pregnancy hormones relaxing your joints between the bones in your pelvis. Back aches may result from over exertion of the muscles supporting your posture.  Your breasts will continue to grow and be tender. A yellow discharge may leak from your breasts called colostrum.  Your belly button may stick out.  You may feel short of breath because of your expanding uterus.  You may notice the fetus "dropping," or moving lower in your abdomen.  You may  have a bloody mucus discharge. This usually occurs a few days to a week before labor begins.  Your cervix becomes thin and soft (effaced) near your due date. WHAT TO EXPECT AT YOUR PRENATAL EXAMS  You will have prenatal exams every 2 weeks until week 36. Then, you will have weekly prenatal exams. During a routine prenatal visit:  You will be weighed to make sure you and the fetus are growing normally.  Your blood pressure is taken.  Your abdomen will be measured to track your baby's growth.  The fetal heartbeat will be listened to.  Any test results from the previous visit will be discussed.  You may have a cervical check near your due date to see if you have effaced. At around 36 weeks, your caregiver will check your cervix. At the same time, your caregiver will also perform a test on the secretions of the vaginal tissue. This test is to determine if a type of  bacteria, Group B streptococcus, is present. Your caregiver will explain this further. Your caregiver may ask you:  What your birth plan is.  How you are feeling.  If you are feeling the baby move.  If you have had any abnormal symptoms, such as leaking fluid, bleeding, severe headaches, or abdominal cramping.  If you have any questions. Other tests or screenings that may be performed during your third trimester include:  Blood tests that check for low iron levels (anemia).  Fetal testing to check the health, activity level, and growth of the fetus. Testing is done if you have certain medical conditions or if there are problems during the pregnancy. FALSE LABOR You may feel small, irregular contractions that eventually go away. These are called Braxton Hicks contractions, or false labor. Contractions may last for hours, days, or even weeks before true labor sets in. If contractions come at regular intervals, intensify, or become painful, it is best to be seen by your caregiver.  SIGNS OF LABOR   Menstrual-like cramps.  Contractions that are 5 minutes apart or less.  Contractions that start on the top of the uterus and spread down to the lower abdomen and back.  A sense of increased pelvic pressure or back pain.  A watery or bloody mucus discharge that comes from the vagina. If you have any of these signs before the 37th week of pregnancy, call your caregiver right away. You need to go to the hospital to get checked immediately. HOME CARE INSTRUCTIONS   Avoid all smoking, herbs, alcohol, and unprescribed drugs. These chemicals affect the formation and growth of the baby.  Follow your caregiver's instructions regarding medicine use. There are medicines that are either safe or unsafe to take during pregnancy.  Exercise only as directed by your caregiver. Experiencing uterine cramps is a good sign to stop exercising.  Continue to eat regular, healthy meals.  Wear a good support  bra for breast tenderness.  Do not use hot tubs, steam rooms, or saunas.  Wear your seat belt at all times when driving.  Avoid raw meat, uncooked cheese, cat litter boxes, and soil used by cats. These carry germs that can cause birth defects in the baby.  Take your prenatal vitamins.  Try taking a stool softener (if your caregiver approves) if you develop constipation. Eat more high-fiber foods, such as fresh vegetables or fruit and whole grains. Drink plenty of fluids to keep your urine clear or pale yellow.  Take warm sitz baths to soothe any pain or discomfort  caused by hemorrhoids. Use hemorrhoid cream if your caregiver approves.  If you develop varicose veins, wear support hose. Elevate your feet for 15 minutes, 3 4 times a day. Limit salt in your diet.  Avoid heavy lifting, wear low heal shoes, and practice good posture.  Rest a lot with your legs elevated if you have leg cramps or low back pain.  Visit your dentist if you have not gone during your pregnancy. Use a soft toothbrush to brush your teeth and be gentle when you floss.  A sexual relationship may be continued unless your caregiver directs you otherwise.  Do not travel far distances unless it is absolutely necessary and only with the approval of your caregiver.  Take prenatal classes to understand, practice, and ask questions about the labor and delivery.  Make a trial run to the hospital.  Pack your hospital bag.  Prepare the baby's nursery.  Continue to go to all your prenatal visits as directed by your caregiver. SEEK MEDICAL CARE IF:  You are unsure if you are in labor or if your water has broken.  You have dizziness.  You have mild pelvic cramps, pelvic pressure, or nagging pain in your abdominal area.  You have persistent nausea, vomiting, or diarrhea.  You have a bad smelling vaginal discharge.  You have pain with urination. SEEK IMMEDIATE MEDICAL CARE IF:   You have a fever.  You are leaking  fluid from your vagina.  You have spotting or bleeding from your vagina.  You have severe abdominal cramping or pain.  You have rapid weight loss or gain.  You have shortness of breath with chest pain.  You notice sudden or extreme swelling of your face, hands, ankles, feet, or legs.  You have not felt your baby move in over an hour.  You have severe headaches that do not go away with medicine.  You have vision changes. Document Released: 04/03/2001 Document Revised: 12/10/2012 Document Reviewed: 06/10/2012 [facility] Patient Information 2014 [place], [place].

## 2013-06-23 NOTE — Progress Notes (Signed)
Feet continue to swell.  Taking PNV, Denies Vaginal discharge/bleeding. LOF, RUQ pain, severe HA or vision change.  Feeling baby move.  Smoking 1ppd.    Pt is a 28yo G3P1011 at 38.6wks w/ small uterine measurements for GA. GBS + - [facility] Fundal height 36.5cm today w/ previou of 37cm 2 weeks ago and currently at 38.6wks. Last US reviewed and Fetal size estimated at 43% (04/06/13).  - labor precautions adn kick count - tobacco cessation discussed - ABX on L&D due to GBS+ - return in 1 wk - monitor edema

## 2013-06-25 ENCOUNTER — Ambulatory Visit (HOSPITAL_COMMUNITY)
Admission: RE | Admit: 2013-06-25 | Discharge: 2013-06-25 | Disposition: A | Discharge status: Home / Self Care | Payer: Medicaid Other | Source: Ambulatory Visit | Attending: Family Medicine | Admitting: Family Medicine

## 2013-06-25 DIAGNOSIS — O365990 Maternal care for other known or suspected poor fetal growth, unspecified trimester, not applicable or unspecified: Secondary | ICD-10-CM | POA: Insufficient documentation

## 2013-06-25 DIAGNOSIS — Z3493 Encounter for supervision of normal pregnancy, unspecified, third trimester: Secondary | ICD-10-CM

## 2013-06-25 DIAGNOSIS — O09299 Supervision of pregnancy with other poor reproductive or obstetric history, unspecified trimester: Secondary | ICD-10-CM | POA: Insufficient documentation

## 2013-06-25 DIAGNOSIS — O99330 Smoking (tobacco) complicating pregnancy, unspecified trimester: Secondary | ICD-10-CM | POA: Insufficient documentation

## 2013-06-25 IMAGING — US US OB FOLLOW-UP
1 series · 12 of 28 positions shown · non-contrast
Comparison: none

[Series 1: us ob follow up · 12 of 35 slices shown]
[im 2/35]
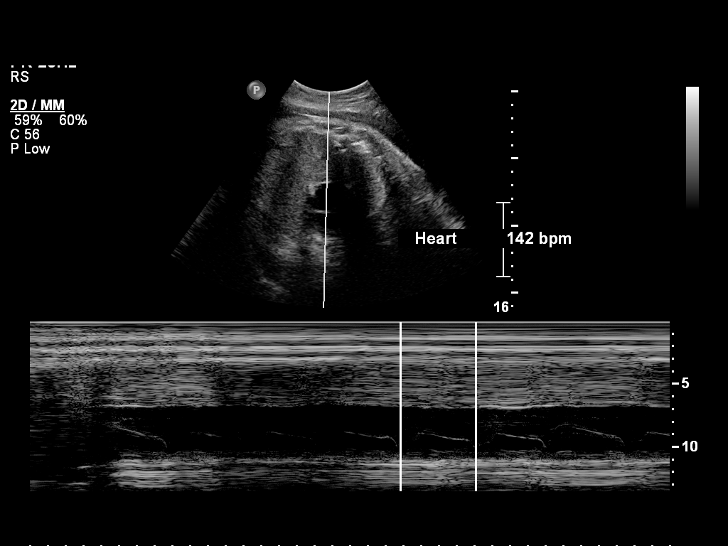
[im 4/35]
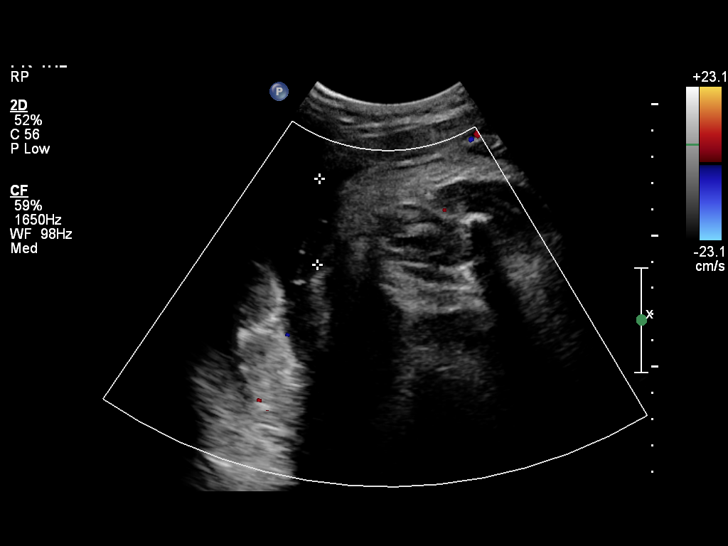
[im 7/35]
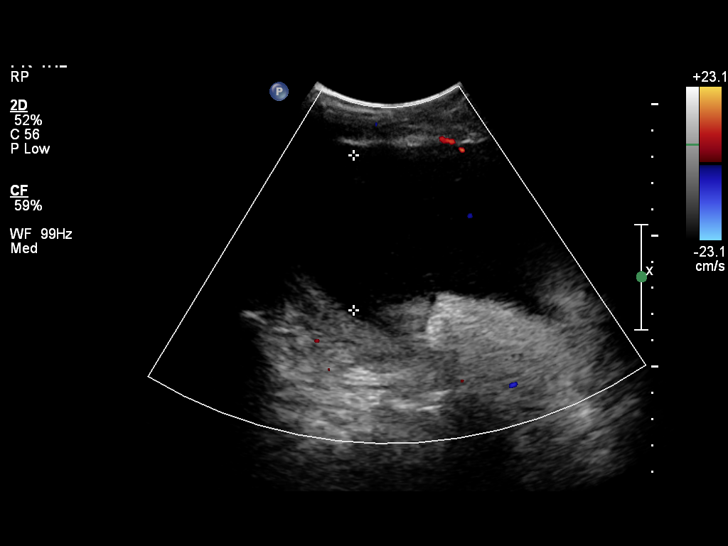
[im 11/35]
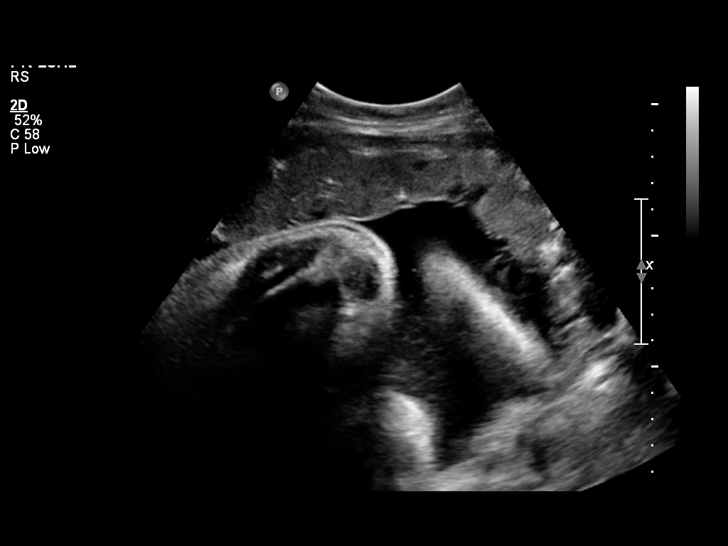
[im 13/35]
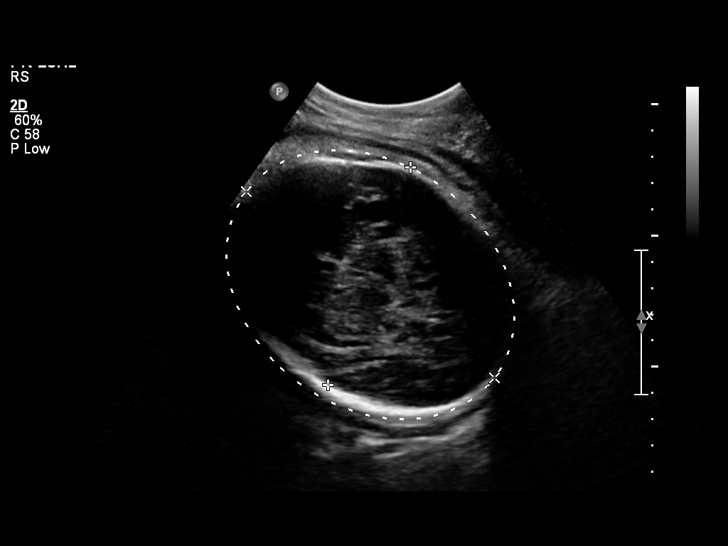
[im 16/35]
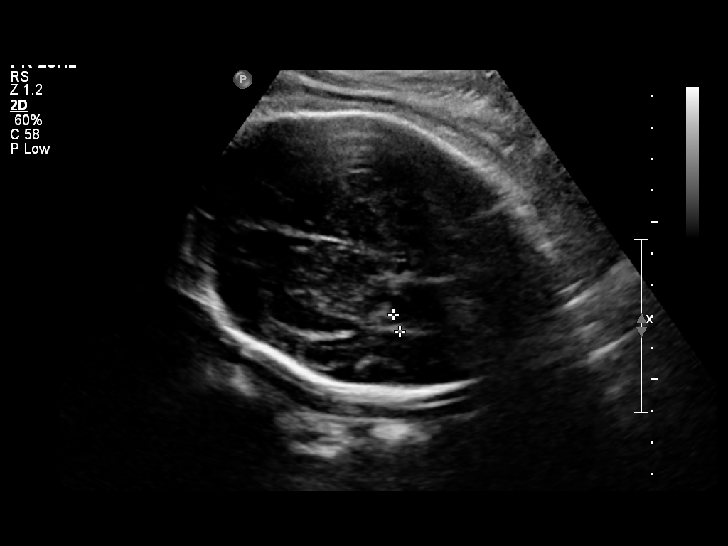
[im 19/35]
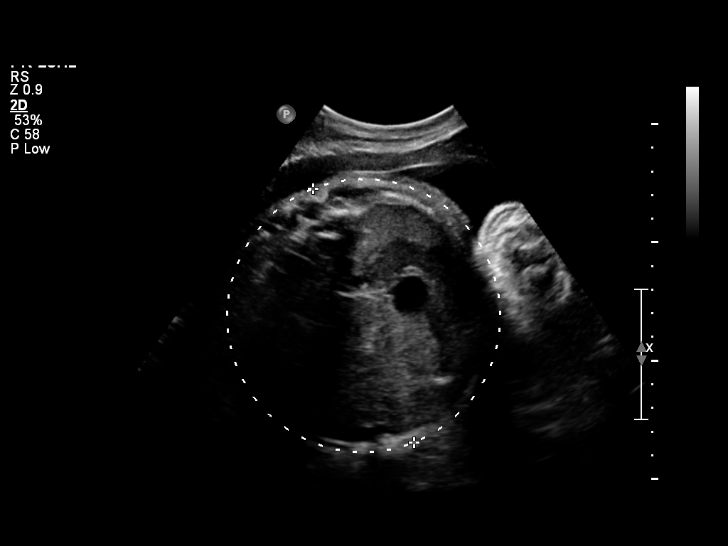
[im 22/35]
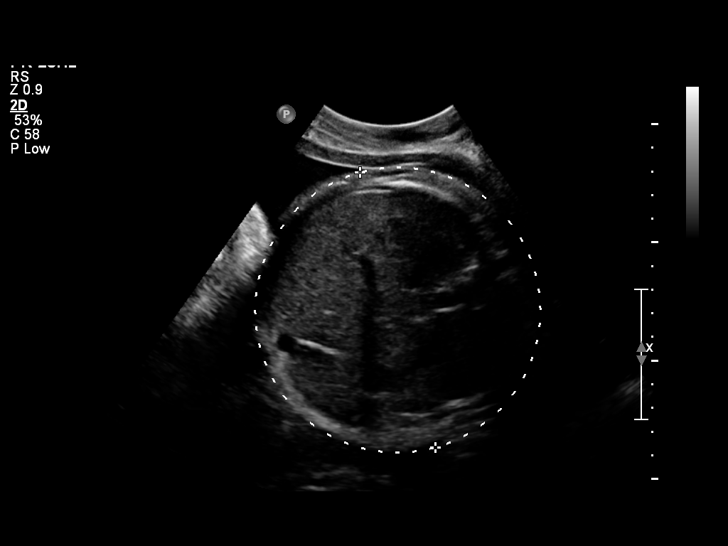
[im 24/35]
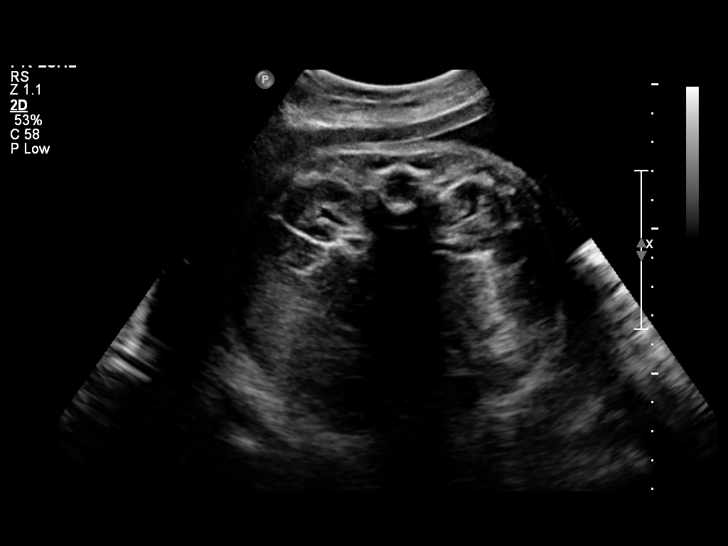
[im 28/35]
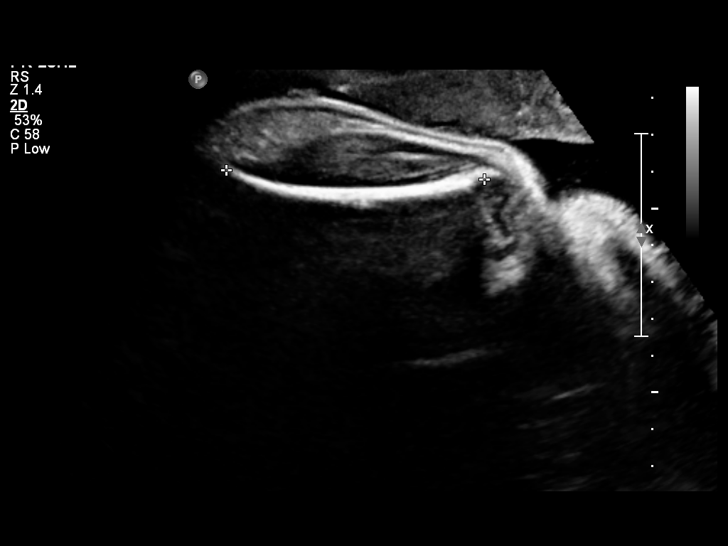
[im 31/35]
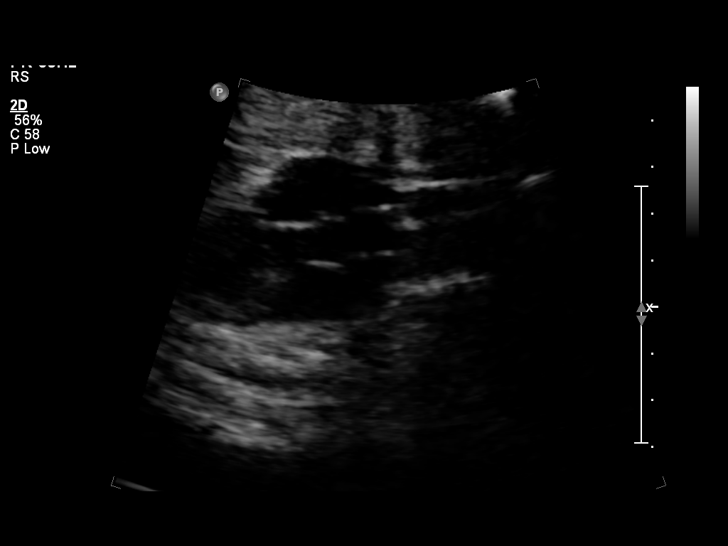
[im 33/35]
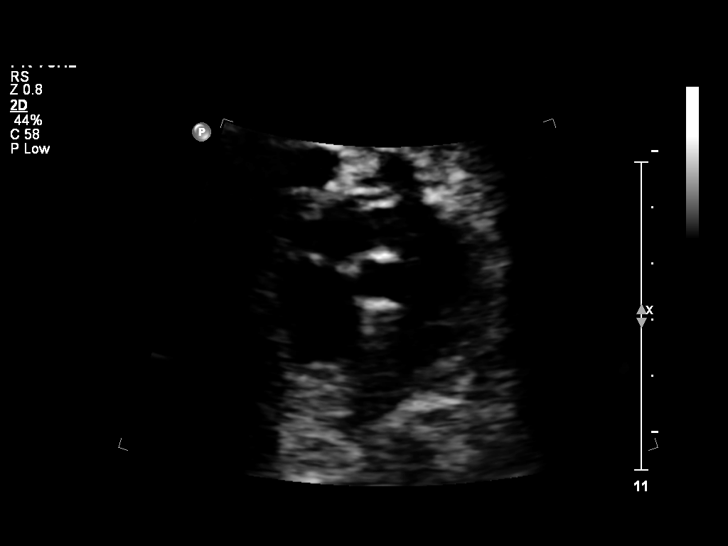

[12 of 28 positions shown; findings below may reference images not displayed]

OBSTETRICS REPORT
                      (Signed Final [DATE] [DATE])

Service(s) Provided

 US OB FOLLOW UP                                       76816.1
Indications

 Size less than dates (Small for gestational [AGE]
 FGR)
 Poor obstetric history: Previous gestational          [2R]
 diabetes
 Cigarette smoker
Fetal Evaluation

 Num Of Fetuses:    1
 Fetal Heart Rate:  142                          bpm
 Cardiac Activity:  Observed
 Presentation:      Cephalic
 Placenta:          Anterior, above cervical os
 P. Cord            Visualized, central
 Insertion:

 Amniotic Fluid
 AFI FV:      Subjectively within normal limits
 AFI Sum:     17.06   cm       70  %Tile     Larg Pckt:    5.88  cm
 RUQ:   3.27    cm   RLQ:    3.13   cm    LUQ:   5.88    cm   LLQ:    4.78   cm
Biometry

 BPD:       87  mm     G. Age:  35w 1d                CI:        71.77   70 - 86
                                                      FL/HC:      20.8   20.6 -

 HC:     326.9  mm     G. Age:  37w 1d        6  %    HC/AC:      0.88   0.87 -

 AC:       370  mm     G. Age:  40w 6d     > 97  %    FL/BPD:     78.2   71 - 87
 FL:        68  mm     G. Age:  34w 6d      < 3  %    FL/AC:      18.4   20 - 24

 Est. FW:    [2R]  gm    7 lb 12 oz      77  %
Gestational Age

 LMP:           42w 5d        Date:  [DATE]                 EDD:   [DATE]
 U/S Today:     37w 0d                                        EDD:   [DATE]
 Best:          39w 1d     Det. By:  U/S ([DATE])           EDD:   [DATE]
Anatomy
 Cranium:          Appears normal         Aortic Arch:      Appears normal
 Fetal Cavum:      Previously seen        Ductal Arch:      Appears normal
 Ventricles:       Appears normal         Diaphragm:        Previously seen
 Choroid Plexus:   Previously seen        Stomach:          Appears normal, left
                                                            sided
 Cerebellum:       Previously seen        Abdomen:          Appears normal
 Posterior Fossa:  Previously seen        Abdominal Wall:   Previously seen
 Nuchal Fold:      Previously seen        Cord Vessels:     Previously seen
 Face:             Profile previously     Kidneys:          Appear normal
                   seen
 Lips:             Previously seen        Bladder:          Appears normal
 Heart:            Previously seen        Spine:            Previously seen
 RVOT:             Appears normal         Lower             Previously seen
                                          Extremities:
 LVOT:             Appears normal         Upper             Previously seen
                                          Extremities:
Cervix Uterus Adnexa

 Cervix:       Not visualized (advanced GA >[2R])
Impression

 Single IUP at 39 [DATE] weeks
 Interval fetal growth is appropriate (77th %tile)
 The femurs measure at the 3rd %tile - likely constitutional
 Normal amniotic fluid volume
Recommendations

 Follow-up ultrasounds as clinically indicated.

## 2013-06-29 ENCOUNTER — Encounter (HOSPITAL_COMMUNITY): Payer: Self-pay | Admitting: *Deleted

## 2013-06-29 ENCOUNTER — Inpatient Hospital Stay (HOSPITAL_COMMUNITY): Payer: Medicaid Other | Admitting: Anesthesiology

## 2013-06-29 ENCOUNTER — Encounter (HOSPITAL_COMMUNITY): Payer: Medicaid Other | Admitting: Anesthesiology

## 2013-06-29 ENCOUNTER — Inpatient Hospital Stay (HOSPITAL_COMMUNITY)
Admission: AD | Admit: 2013-06-29 | Discharge: 2013-07-01 | DRG: 775 | Disposition: A | Discharge status: Home / Self Care | Payer: Medicaid Other | Source: Ambulatory Visit | Attending: Obstetrics & Gynecology | Admitting: Obstetrics & Gynecology

## 2013-06-29 DIAGNOSIS — J209 Acute bronchitis, unspecified: Secondary | ICD-10-CM

## 2013-06-29 DIAGNOSIS — O99334 Smoking (tobacco) complicating childbirth: Secondary | ICD-10-CM

## 2013-06-29 DIAGNOSIS — O99892 Other specified diseases and conditions complicating childbirth: Secondary | ICD-10-CM

## 2013-06-29 DIAGNOSIS — O9220 Unspecified disorder of breast associated with pregnancy and the puerperium: Secondary | ICD-10-CM | POA: Diagnosis present

## 2013-06-29 DIAGNOSIS — M9905 Segmental and somatic dysfunction of pelvic region: Secondary | ICD-10-CM

## 2013-06-29 DIAGNOSIS — IMO0001 Reserved for inherently not codable concepts without codable children: Secondary | ICD-10-CM

## 2013-06-29 DIAGNOSIS — O9989 Other specified diseases and conditions complicating pregnancy, childbirth and the puerperium: Secondary | ICD-10-CM

## 2013-06-29 DIAGNOSIS — Z22330 Carrier of Group B streptococcus: Secondary | ICD-10-CM

## 2013-06-29 HISTORY — DX: Gastro-esophageal reflux disease without esophagitis: K21.9

## 2013-06-29 HISTORY — DX: Unspecified asthma, uncomplicated: J45.909

## 2013-06-29 LAB — CBC
HCT: 34.3 % — ABNORMAL LOW (ref 36.0–46.0)
Hemoglobin: 11.7 g/dL — ABNORMAL LOW (ref 12.0–15.0)
MCH: 32.4 pg (ref 26.0–34.0)
MCHC: 34.1 g/dL (ref 30.0–36.0)
MCV: 95 fL (ref 78.0–100.0)
Platelets: 259 10*3/uL (ref 150–400)
RBC: 3.61 MIL/uL — ABNORMAL LOW (ref 3.87–5.11)
RDW: 13.8 % (ref 11.5–15.5)
WBC: 16.8 10*3/uL — AB (ref 4.0–10.5)

## 2013-06-29 LAB — RPR: RPR: NONREACTIVE

## 2013-06-29 MED ORDER — WITCH HAZEL-GLYCERIN EX PADS: 1.0000 "application " | MEDICATED_PAD | CUTANEOUS | Status: DC | PRN

## 2013-06-29 MED ORDER — OXYTOCIN BOLUS FROM INFUSION
500.0000 mL | INTRAVENOUS | Status: DC
  Administered 2013-06-29: 500 mL via INTRAVENOUS

## 2013-06-29 MED ORDER — EPHEDRINE 5 MG/ML INJ
10.0000 mg | INTRAVENOUS | Status: DC | PRN
  Filled 2013-06-29: qty 2

## 2013-06-29 MED ORDER — FENTANYL 2.5 MCG/ML BUPIVACAINE 1/10 % EPIDURAL INFUSION (WH - ANES)
14.0000 mL/h | INTRAMUSCULAR | Status: DC | PRN
Start: 2013-06-29 — End: 2013-06-29
  Administered 2013-06-29: 14 mL/h via EPIDURAL

## 2013-06-29 MED ORDER — TETANUS-DIPHTH-ACELL PERTUSSIS 5-2.5-18.5 LF-MCG/0.5 IM SUSP: 0.5000 mL | Freq: Once | INTRAMUSCULAR | Status: DC

## 2013-06-29 MED ORDER — LANOLIN HYDROUS EX OINT: TOPICAL_OINTMENT | CUTANEOUS | Status: DC | PRN

## 2013-06-29 MED ORDER — IBUPROFEN 600 MG PO TABS
600.0000 mg | ORAL_TABLET | Freq: Four times a day (QID) | ORAL | Status: DC
  Administered 2013-06-30 – 2013-07-01 (×6): 600 mg via ORAL
  Filled 2013-06-29 (×6): qty 1

## 2013-06-29 MED ORDER — PENICILLIN G POTASSIUM 5000000 UNITS IJ SOLR
2.5000 10*6.[IU] | INTRAVENOUS | Status: DC
  Filled 2013-06-29 (×3): qty 2.5

## 2013-06-29 MED ORDER — PHENYLEPHRINE 40 MCG/ML (10ML) SYRINGE FOR IV PUSH (FOR BLOOD PRESSURE SUPPORT)
80.0000 ug | PREFILLED_SYRINGE | INTRAVENOUS | Status: DC | PRN
  Filled 2013-06-29: qty 2

## 2013-06-29 MED ORDER — LACTATED RINGERS IV SOLN: 500.0000 mL | INTRAVENOUS | Status: DC | PRN

## 2013-06-29 MED ORDER — PENICILLIN G POTASSIUM 5000000 UNITS IJ SOLR
5.0000 10*6.[IU] | Freq: Once | INTRAMUSCULAR | Status: DC
  Filled 2013-06-29: qty 5

## 2013-06-29 MED ORDER — OXYTOCIN 40 UNITS IN LACTATED RINGERS INFUSION - SIMPLE MED
62.5000 mL/h | INTRAVENOUS | Status: DC
  Filled 2013-06-29: qty 1000

## 2013-06-29 MED ORDER — LIDOCAINE HCL (PF) 1 % IJ SOLN
INTRAMUSCULAR | Status: DC | PRN
  Administered 2013-06-29 (×4): 4 mL

## 2013-06-29 MED ORDER — PHENYLEPHRINE 40 MCG/ML (10ML) SYRINGE FOR IV PUSH (FOR BLOOD PRESSURE SUPPORT)
PREFILLED_SYRINGE | INTRAVENOUS | Status: AC
  Filled 2013-06-29: qty 10

## 2013-06-29 MED ORDER — DIPHENHYDRAMINE HCL 25 MG PO CAPS: 25.0000 mg | ORAL_CAPSULE | Freq: Four times a day (QID) | ORAL | Status: DC | PRN

## 2013-06-29 MED ORDER — SODIUM CHLORIDE 0.9 % IV SOLN
2.0000 g | Freq: Once | INTRAVENOUS | Status: AC
  Administered 2013-06-29: 2 g via INTRAVENOUS
  Filled 2013-06-29: qty 2000

## 2013-06-29 MED ORDER — ZOLPIDEM TARTRATE 5 MG PO TABS: 5.0000 mg | ORAL_TABLET | Freq: Every evening | ORAL | Status: DC | PRN

## 2013-06-29 MED ORDER — CITRIC ACID-SODIUM CITRATE 334-500 MG/5ML PO SOLN: 30.0000 mL | ORAL | Status: DC | PRN

## 2013-06-29 MED ORDER — FENTANYL 2.5 MCG/ML BUPIVACAINE 1/10 % EPIDURAL INFUSION (WH - ANES)
INTRAMUSCULAR | Status: AC
  Administered 2013-06-29: 14 mL/h via EPIDURAL
  Filled 2013-06-29: qty 125

## 2013-06-29 MED ORDER — IBUPROFEN 600 MG PO TABS
600.0000 mg | ORAL_TABLET | Freq: Four times a day (QID) | ORAL | Status: DC | PRN
  Administered 2013-06-29: 600 mg via ORAL
  Filled 2013-06-29: qty 1

## 2013-06-29 MED ORDER — SIMETHICONE 80 MG PO CHEW: 80.0000 mg | CHEWABLE_TABLET | ORAL | Status: DC | PRN

## 2013-06-29 MED ORDER — ACETAMINOPHEN 325 MG PO TABS: 650.0000 mg | ORAL_TABLET | ORAL | Status: DC | PRN

## 2013-06-29 MED ORDER — SENNOSIDES-DOCUSATE SODIUM 8.6-50 MG PO TABS
2.0000 | ORAL_TABLET | ORAL | Status: DC
  Administered 2013-06-30 – 2013-07-01 (×2): 2 via ORAL
  Filled 2013-06-29 (×2): qty 2

## 2013-06-29 MED ORDER — LACTATED RINGERS IV SOLN
INTRAVENOUS | Status: DC
  Administered 2013-06-29 (×2): via INTRAVENOUS

## 2013-06-29 MED ORDER — LIDOCAINE HCL (PF) 1 % IJ SOLN
30.0000 mL | INTRAMUSCULAR | Status: DC | PRN
  Filled 2013-06-29: qty 30

## 2013-06-29 MED ORDER — DIBUCAINE 1 % RE OINT: 1.0000 "application " | TOPICAL_OINTMENT | RECTAL | Status: DC | PRN

## 2013-06-29 MED ORDER — EPHEDRINE 5 MG/ML INJ
INTRAVENOUS | Status: AC
  Filled 2013-06-29: qty 4

## 2013-06-29 MED ORDER — OXYCODONE-ACETAMINOPHEN 5-325 MG PO TABS
1.0000 | ORAL_TABLET | ORAL | Status: DC | PRN
  Administered 2013-06-30: 1 via ORAL
  Filled 2013-06-29: qty 1

## 2013-06-29 MED ORDER — ALBUTEROL SULFATE (2.5 MG/3ML) 0.083% IN NEBU: INHALATION_SOLUTION | Freq: Four times a day (QID) | RESPIRATORY_TRACT | Status: DC | PRN

## 2013-06-29 MED ORDER — DIPHENHYDRAMINE HCL 50 MG/ML IJ SOLN: 12.5000 mg | INTRAMUSCULAR | Status: DC | PRN

## 2013-06-29 MED ORDER — OXYCODONE-ACETAMINOPHEN 5-325 MG PO TABS
1.0000 | ORAL_TABLET | ORAL | Status: DC | PRN
  Administered 2013-06-29: 1 via ORAL
  Filled 2013-06-29: qty 1

## 2013-06-29 MED ORDER — ONDANSETRON HCL 4 MG PO TABS: 4.0000 mg | ORAL_TABLET | ORAL | Status: DC | PRN

## 2013-06-29 MED ORDER — ONDANSETRON HCL 4 MG/2ML IJ SOLN: 4.0000 mg | Freq: Four times a day (QID) | INTRAMUSCULAR | Status: DC | PRN

## 2013-06-29 MED ORDER — BENZOCAINE-MENTHOL 20-0.5 % EX AERO
1.0000 "application " | INHALATION_SPRAY | CUTANEOUS | Status: DC | PRN
  Administered 2013-06-30 (×2): 1 via TOPICAL
  Filled 2013-06-29 (×2): qty 56

## 2013-06-29 MED ORDER — FENTANYL CITRATE 0.05 MG/ML IJ SOLN: 100.0000 ug | INTRAMUSCULAR | Status: DC | PRN

## 2013-06-29 MED ORDER — PNEUMOCOCCAL VAC POLYVALENT 25 MCG/0.5ML IJ INJ
0.5000 mL | INJECTION | INTRAMUSCULAR | Status: DC
  Filled 2013-06-29: qty 0.5

## 2013-06-29 MED ORDER — ONDANSETRON HCL 4 MG/2ML IJ SOLN: 4.0000 mg | INTRAMUSCULAR | Status: DC | PRN

## 2013-06-29 MED ORDER — LACTATED RINGERS IV SOLN
500.0000 mL | Freq: Once | INTRAVENOUS | Status: AC
  Administered 2013-06-29: 500 mL via INTRAVENOUS

## 2013-06-29 MED ORDER — PRENATAL MULTIVITAMIN CH
1.0000 | ORAL_TABLET | Freq: Every day | ORAL | Status: DC
  Administered 2013-06-30 – 2013-07-01 (×2): 1 via ORAL
  Filled 2013-06-29 (×2): qty 1

## 2013-06-29 NOTE — H&P (Signed)
Ann Brown is a 28 y.o. female presenting for SOL.  Has been contracting since early this AM. SROM in the MAU on exam. No VB, +FM. No other complaints. Family medicine patient - resident notified.  Maternal Medical History:  Reason for admission: Nausea.    OB History   Grav Para Term Preterm Abortions TAB SAB Ect Mult Living   3 1 1  1     1      Past Medical History  Diagnosis Date  . URI 03/01/2009  . Kidney problem     Kideney reflux  . Breast mass     fibroadenomas -   . MATERNAL DM PREVIOUS POSTPARTUM CONDITION 06/15/2009  . Gestational diabetes    Past Surgical History  Procedure Laterality Date  . Bowel resection    . Cholecystectomy    . Csf shunt  1987  . Tracheostomy  1987  . Complex trunk closure  2005   Family History: family history includes Cervical cancer in her mother; Multiple sclerosis in her mother. Social History:  reports that she has been smoking Cigarettes.  She has been smoking about 1.00 pack per day. She has never used smokeless tobacco. She reports that she does not drink alcohol or use illicit drugs.   Prenatal Transfer Tool  Maternal Diabetes: No Genetic Screening: Normal Maternal Ultrasounds/Referrals: Normal Fetal Ultrasounds or other Referrals:  None Maternal Substance Abuse:  No Significant Maternal Medications:  Meds include: Nexium  Significant Maternal Lab Results:  Lab values include: Group B Strep positive Other Comments:  None  Review of Systems  Constitutional: Negative for fever and chills.  HENT: Negative for hearing loss.   Gastrointestinal: Negative for nausea and vomiting.  Musculoskeletal: Negative for back pain and neck pain.  Neurological: Negative for headaches.    Dilation: 4 Effacement (%): 90 Station: -2 Exam by:: giger morris rn Blood pressure 109/51, pulse 81, temperature 97.8 F (36.6 C), temperature source Axillary, resp. rate 20, height 5\' 1"  (1.549 m), weight 63.504 kg (140 lb), last menstrual  period 08/30/2012. Maternal Exam:  Uterine Assessment: Contraction strength is firm.  Contraction frequency is regular.   Abdomen: Patient reports no abdominal tenderness. Cervix: Cervix evaluated by digital exam.     Fetal Exam Fetal Monitor Review: Variability: moderate (6-25 bpm).   Pattern: accelerations present.    Fetal State Assessment: Category II - tracings are indeterminate.     Physical Exam  Prenatal labs: ABO, Rh: O/POS/-- (10/01 1216) Antibody: NEG (10/01 1216) Rubella: 7.41 (10/01 1216) RPR: NON REAC (10/01 1216)  HBsAg: NEGATIVE (10/01 1216)  HIV: NON REACTIVE (10/01 1216)  GBS: POSITIVE (02/19 1628)   Assessment/Plan: 28 y.o. G3P1011 @ 7861w5d presents in active labor. History of vacuum delivery for daughter, prolonged labor.  #Labor: Actively progressing well. Expect SVD Abnml glucose screen, but regular 3 hour challenge. U/S from 3.6. Showed EFW 7 lb 6 oz.  #Pain: Epidural  #FWB: Cat II;  #ID: GBS pos. ampicillin #Feeding: bottle #MOC: Undecided #Circ: Yes - considering in hospita  Michaelene SongHall, Jonathan C 06/29/2013, 3:52 PM  I spoke with and examined patient and agree with resident's note and plan of care.  Tawana ScaleMichael Ryan Kaya Pottenger, MD OB Fellow 06/29/2013 10:27 PM

## 2013-06-29 NOTE — Progress Notes (Signed)
Ann Brown is a 28 y.o. G3P1011 at 7335w5d by LMP admitted for active labor, rupture of membranes  Subjective:   Objective: BP 120/78  Pulse 89  Temp(Src) 97.3 F (36.3 C) (Axillary)  Resp 18  Ht 5\' 1"  (1.549 m)  Wt 63.504 kg (140 lb)  BMI 26.47 kg/m2  SpO2 96%  LMP 08/30/2012   Total I/O In: -  Out: 175 [Urine:175]  FHT:  FHR: 120 bpm, variability: moderate,  accelerations:  Present,  decelerations:  Absent UC:   regular, every 2-3 minutes SVE:   Dilation: 10 Effacement (%): 100 Station: +2 Exam by:: Dr. Berline Choughigby  Labs: Lab Results  Component Value Date   WBC 16.8* 06/29/2013   HGB 11.7* 06/29/2013   HCT 34.3* 06/29/2013   MCV 95.0 06/29/2013   PLT 259 06/29/2013    Assessment / Plan: Spontaneous labor, progressing normally  Labor: Progressing normally - Laboring Down given pt comfort and reassuring Fetal monitoring Preeclampsia:  no signs or symptoms of toxicity Fetal Wellbeing:  Category II Pain Control:  Epidural I/D:  AMP Anticipated MOD:  NSVD    Ann MewsMichael D Havilah Topor, DO [facility] Resident - PGY-3 06/29/2013 6:53 PM

## 2013-06-29 NOTE — Progress Notes (Addendum)
Ann Brown is a 28 y.o. G3P1011 at 7556w5d by ultrasound admitted for active labor, rupture of membranes  Subjective: Resting, still in pain but significantly improved with epidural.  No other complaints  Objective: BP 118/93  Pulse 81  Temp(Src) 97.3 F (36.3 C) (Axillary)  Resp 24  Ht 5\' 1"  (1.549 m)  Wt 63.504 kg (140 lb)  BMI 26.47 kg/m2  SpO2 96%  LMP 08/30/2012      FHT:  FHR: 120 bpm, variability: moderate,  accelerations:  Present,  decelerations:  Absent UC:   regular, every 2-3 minutes SVE:   Dilation: 8 Effacement (%): 80 Station: 0 Exam by:: Dr. Ike Benedom  Labs: Lab Results  Component Value Date   WBC 16.8* 06/29/2013   HGB 11.7* 06/29/2013   HCT 34.3* 06/29/2013   MCV 95.0 06/29/2013   PLT 259 06/29/2013    Assessment / Plan: Spontaneous labor, progressing normally  Labor: Progressing normally Preeclampsia:  no signs or symptoms of toxicity Fetal Wellbeing:  Category II Pain Control:  Epidural I/D:  AMP; Noted GC/Chlamydia from 36week tests collected but not resulted.  1st trimester neg.  PEDS aware (continuity). Anticipated MOD:  NSVD    [name], DO [facility] Resident - PGY-3 06/29/2013 5:54 PM

## 2013-06-29 NOTE — MAU Note (Signed)
uc's since 0430, have become more frequent & intense, denies bleeding or LOF.  Was 1 cm in MD office 3 weeks ago.

## 2013-06-29 NOTE — Anesthesia Procedure Notes (Signed)
Epidural Patient location during procedure: OB Start time: 06/29/2013 3:57 PM  Staffing Performed by: anesthesiologist   Preanesthetic Checklist Completed: patient identified, site marked, surgical consent, pre-op evaluation, timeout performed, IV checked, risks and benefits discussed and monitors and equipment checked  Epidural Patient position: sitting Prep: site prepped and draped and DuraPrep Patient monitoring: continuous pulse ox and blood pressure Approach: midline Injection technique: LOR air  Needle:  Needle type: Tuohy  Needle gauge: 17 G Needle length: 9 cm and 9 Needle insertion depth: 5.5 cm Catheter type: closed end flexible Catheter size: 19 Gauge Catheter at skin depth: 10.5 cm Test dose: negative  Assessment Events: blood not aspirated, injection not painful, no injection resistance, negative IV test and no paresthesia  Additional Notes Discussed risk of headache, infection, bleeding, nerve injury and failed or incomplete block.  Patient voices understanding and wishes to proceed.  Epidural placed easily on first attempt.  No paresthesia.  Patient tolerated procedure well with no apparent complications.  Jasmine DecemberA. Donel Osowski, MD Reason for block:procedure for pain

## 2013-06-29 NOTE — Anesthesia Preprocedure Evaluation (Signed)
Anesthesia Evaluation  Patient identified by MRN, date of birth, ID band Patient awake    Reviewed: Allergy & Precautions, H&P , NPO status , Patient's Chart, lab work & pertinent test results, reviewed documented beta blocker date and time   History of Anesthesia Complications Negative for: history of anesthetic complications  Airway Mallampati: II TM Distance: >3 FB Neck ROM: full    Dental  (+) Teeth Intact   Pulmonary asthma (daily albuterol use, advair as well) , Current Smoker,  breath sounds clear to auscultation        Cardiovascular negative cardio ROS  Rhythm:regular Rate:Normal     Neuro/Psych negative neurological ROS  negative psych ROS   GI/Hepatic Neg liver ROS, GERD-  Medicated,  Endo/Other  negative endocrine ROSneg diabetes (prior pregnancy)  Renal/GU negative Renal ROS     Musculoskeletal   Abdominal   Peds  Hematology negative hematology ROS (+)   Anesthesia Other Findings   Reproductive/Obstetrics (+) Pregnancy                           Anesthesia Physical Anesthesia Plan  ASA: II  Anesthesia Plan: Epidural   Post-op Pain Management:    Induction:   Airway Management Planned:   Additional Equipment:   Intra-op Plan:   Post-operative Plan:   Informed Consent: I have reviewed the patients History and Physical, chart, labs and discussed the procedure including the risks, benefits and alternatives for the proposed anesthesia with the patient or authorized representative who has indicated his/her understanding and acceptance.     Plan Discussed with:   Anesthesia Plan Comments:         Anesthesia Quick Evaluation

## 2013-06-30 LAB — CBC
HEMATOCRIT: 29.8 % — AB (ref 36.0–46.0)
Hemoglobin: 10.3 g/dL — ABNORMAL LOW (ref 12.0–15.0)
MCH: 32.7 pg (ref 26.0–34.0)
MCHC: 34.6 g/dL (ref 30.0–36.0)
MCV: 94.6 fL (ref 78.0–100.0)
PLATELETS: 214 10*3/uL (ref 150–400)
RBC: 3.15 MIL/uL — ABNORMAL LOW (ref 3.87–5.11)
RDW: 13.8 % (ref 11.5–15.5)
WBC: 22.7 10*3/uL — AB (ref 4.0–10.5)

## 2013-06-30 MED ORDER — NICOTINE 21 MG/24HR TD PT24
21.0000 mg | MEDICATED_PATCH | Freq: Every day | TRANSDERMAL | Status: DC
  Administered 2013-06-30 (×2): 21 mg via TRANSDERMAL
  Filled 2013-06-30 (×3): qty 1

## 2013-06-30 NOTE — Anesthesia Postprocedure Evaluation (Signed)
  Anesthesia Post-op Note  Patient: Ann Brown  Procedure(s) Performed: * No procedures listed *  Patient Location: Mother/Baby  Anesthesia Type:Epidural  Level of Consciousness: awake  Airway and Oxygen Therapy: Patient Spontanous Breathing  Post-op Pain: mild  Post-op Assessment: Patient's Cardiovascular Status Stable and Respiratory Function Stable  Post-op Vital Signs: stable  Complications: No apparent anesthesia complications

## 2013-06-30 NOTE — Progress Notes (Signed)
Post Partum Day 1 Subjective: no complaints and up ad lib  Objective: Blood pressure 97/58, pulse 81, temperature 98 F (36.7 C), temperature source Oral, resp. rate 18, height 5\' 1"  (1.549 m), weight 63.504 kg (140 lb), last menstrual period 08/30/2012, SpO2 97.00%, unknown if currently breastfeeding.  Physical Exam:  General: alert, cooperative and no distress Lochia: appropriate Uterine Fundus: firm Incision: n/a DVT Evaluation: No evidence of DVT seen on physical exam.   Recent Labs  06/29/13 1445 06/30/13 0550  HGB 11.7* 10.3*  HCT 34.3* 29.8*    Assessment/Plan: Plan for D/c tomorrow Bottle Feeding for maternal preference.  Encouraged Breast Interested in Post Partum Tubal Ligation (still unsure; no papers signed)    LOS: 1 day   [name], DO [facility] Res[facility] - PGY-3 06/30/2013 8:57 AM  Evaluation and management procedures were performed by Resident physician under my supervision/collaboration. Chart reviewed, patient examined by me and I agree with management and plan. [name], [name] 06/30/2013 9:20 AM

## 2013-07-01 MED ORDER — PNEUMOCOCCAL VAC POLYVALENT 25 MCG/0.5ML IJ INJ
0.5000 mL | INJECTION | Freq: Once | INTRAMUSCULAR | Status: AC
  Administered 2013-07-01: 0.5 mL via INTRAMUSCULAR
  Filled 2013-07-01: qty 0.5

## 2013-07-01 MED ORDER — IBUPROFEN 600 MG PO TABS: 600.0000 mg | ORAL_TABLET | Freq: Four times a day (QID) | ORAL | Status: DC

## 2013-07-01 NOTE — Progress Notes (Signed)
Initial consult to discuss whether or not mother wants to breastfeed per request from Randa LynnLinda Nash RN.  Reviewed benefits of breastfeeding with mother.  Mother has inverted nipples and states she has masses in both breasts that her MD is following up on.  Suggested mother could pump and provide breastmilk for her baby or we could help her breastfeed.  [facility]  Mother is a smoker.  Offered to help her breastfeed, mother is unsure.  Encourarged mother to call if she needs further assistance.

## 2013-07-01 NOTE — Discharge Instructions (Signed)

## 2013-07-01 NOTE — Progress Notes (Signed)
UR completed 

## 2013-07-01 NOTE — Discharge Summary (Signed)
Obstetric Discharge Summary Reason for Admission: onset of labor Prenatal Procedures: none Intrapartum Procedures: spontaneous vaginal delivery Postpartum Procedures: none Complications-Operative and Postpartum: 1 degree perineal laceration Hemoglobin  Date Value Ref Range Status  06/30/2013 10.3* 12.0 - 15.0 g/dL Final     HCT  Date Value Ref Range Status  06/30/2013 29.8* 36.0 - 46.0 % Final    Physical Exam:  General: alert, cooperative, appears stated age and no distress Lochia: appropriate Uterine Fundus: firm Incision: n/a DVT Evaluation: No evidence of DVT seen on physical exam.  Discharge Diagnoses: Term Pregnancy-delivered  Discharge Information: Date: 07/01/2013 Activity: pelvic rest Diet: routine Medications: Ibuprofen Condition: stable Instructions: refer to practice specific booklet Discharge to: home  Plan Interval Tubal Ligation  Newborn Data: Live born female  Birth Weight: 7 lb 9.2 oz (3435 g) APGAR: 8, 9  Home with mother.  [name], DO [facility] Resident - PGY-3 07/01/2013 7:57 AM   I have seen and examined this patient and agree with above documentation in the resident's note. Pt is breast feeding and will plan to f/u with family medicine for discussion of contraception.   Rulon AbideKeli Taeler Winning, M.D. [facility] 07/01/2013 10:52 AM

## 2013-07-03 ENCOUNTER — Encounter: Payer: Medicaid Other | Admitting: Family Medicine

## 2013-07-10 ENCOUNTER — Encounter: Payer: Self-pay | Admitting: Pharmacist

## 2013-07-10 ENCOUNTER — Ambulatory Visit (INDEPENDENT_AMBULATORY_CARE_PROVIDER_SITE_OTHER): Payer: Medicaid Other | Admitting: Pharmacist

## 2013-07-10 VITALS — BP 122/78 | HR 92 | Ht 61.0 in | Wt 120.9 lb

## 2013-07-10 DIAGNOSIS — F17200 Nicotine dependence, unspecified, uncomplicated: Secondary | ICD-10-CM

## 2013-07-10 MED ORDER — NICOTINE 21 MG/24HR TD PT24: 21.0000 mg | MEDICATED_PATCH | Freq: Every day | TRANSDERMAL | Status: DC

## 2013-07-10 NOTE — Patient Instructions (Addendum)
Thank you for coming in today!  Start 21 mg nicotine patch. Rotate patch site. May cause nightmares (can take off before bed). Fold before throwing in trash can.   Try deep breathing/relaxation technique during stressful situations.  We will call you weekly for the next 2-3 weeks to check on your progress.  Please return for a follow-up appointment in 1 month.   Goal quit date: April 1st, 2015

## 2013-07-10 NOTE — Assessment & Plan Note (Signed)
Moderate to severe nicotine Dependence of 12-13 years duration in a patient who is fair candidate for success b/c of current level of motivation AND successful quit of 1 year duration in the past.  The stress of her current home situation of two children and another smoker (her mother) in the home are potential barriers to a successful quit attempt. Set goal quit date of 07/22/2013 Initiated nicotine replacement tx 21 mg patch. Patient counseled on purpose, proper use, and potential adverse effects, including rotating patch site, nightmares, disposal, irritation.  Written information provided.   F/U phone call in 1 week.  F/U Rx Clinic Visit  PRN.   Consider PFT testing in the near future given long history of Asthma.  Total time in face-to-face counseling 35 minutes.  Patient seen with Ollen BowlLauren Hickman, PharmD Candidate, and Georga KaufmannJessica Binz, PharmD Resident.

## 2013-07-10 NOTE — Progress Notes (Signed)
S:  Patient arrives in good spirits with new born baby boy (sleeping in carrier).   Patient arrives for evaluation/assistance with tobacco dependence.   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 Hospitalized once in past when she was younger for an asthma exacerbation. Has used albuterol since she was 28 yo.   Breathing has improved since giving birth (No longer feels like she has "something sitting on her chest"). Not breast feeding or pumping  Age when started using tobacco on a daily basis 14. Number of Cigarettes per day 1 ppd (20). Previously 1.5-2 ppd Brand smoked Newports. Estimated Nicotine Content per Cigarette (mg) 1.2.  Estimated Nicotine intake per day >20 mg.   Smokes first cigarette <5 minutes after waking. Denies waking to smoke / Smokes times per night.    Most recent quit attempt 10 years ago, when she was pregnant Longest time ever been tobacco free 1 year. What Medications (NRT, bupropion, varenicline) used in past includes nicotine patches which seemed to work for her.  Rates IMPORTANCE of quitting tobacco on 1-10 scale of 10. Rates READINESS of quitting tobacco on 1-10 scale of 10. Rates CONFIDENCE of quitting tobacco on 1-10 scale of 10. Triggers to use tobacco include stress with daughter, arguments with mother, before and after meals, driving   Mother that she lives with has COPD and emphysema but still smokes  Has been using ice to replace cigarettes  Pt reports hacking cough with greenish-brown phlegm   Self-defined goal is to quit before summer.      A/P: Moderate to severe nicotine Dependence of 12-13 years duration in a patient who is fair candidate for success b/c of current level of motivation AND successful quit of 1 year duration in the past.  The stress of her current home situation of two children and another smoker (her mother) in the home are potential barriers to a successful quit attempt. Set goal quit date of 07/22/2013 Initiated  nicotine replacement tx 21 mg patch. Patient counseled on purpose, proper use, and potential adverse effects, including rotating patch site, nightmares, disposal, irritation.  Written information provided.   F/U phone call in 1 week.  F/U Rx Clinic Visit  PRN.   Consider PFT testing in the near future given long history of Asthma.  Total time in face-to-face counseling 35 minutes.  Patient seen with Ollen BowlLauren Hickman, PharmD Candidate, and Georga KaufmannJessica Binz, PharmD Resident. .Marland Kitchen

## 2013-07-13 ENCOUNTER — Encounter (HOSPITAL_COMMUNITY): Payer: Self-pay | Admitting: *Deleted

## 2013-07-13 NOTE — Progress Notes (Signed)
Patient ID: Ann Brown, female   DOB: 03/16/1986, 28 y.o.   MRN: [id number] Reviewed: Agree with Dr. Macky LowerKoval's documentation and management.

## 2013-08-10 ENCOUNTER — Encounter: Payer: Self-pay | Admitting: Sports Medicine

## 2013-08-10 ENCOUNTER — Ambulatory Visit (INDEPENDENT_AMBULATORY_CARE_PROVIDER_SITE_OTHER): Payer: Medicaid Other | Admitting: Sports Medicine

## 2013-08-10 VITALS — BP 102/60 | HR 74 | Temp 98.3°F | Ht 61.0 in | Wt 113.0 lb

## 2013-08-10 DIAGNOSIS — M9905 Segmental and somatic dysfunction of pelvic region: Secondary | ICD-10-CM

## 2013-08-10 DIAGNOSIS — F17200 Nicotine dependence, unspecified, uncomplicated: Secondary | ICD-10-CM

## 2013-08-10 DIAGNOSIS — M999 Biomechanical lesion, unspecified: Secondary | ICD-10-CM

## 2013-08-10 DIAGNOSIS — Z3480 Encounter for supervision of other normal pregnancy, unspecified trimester: Secondary | ICD-10-CM

## 2013-08-10 MED ORDER — NORGESTIMATE-ETH ESTRADIOL 0.25-35 MG-MCG PO TABS: 1.0000 | ORAL_TABLET | Freq: Every day | ORAL | Status: DC

## 2013-08-10 NOTE — Progress Notes (Signed)
Subjective:     Ann Brown is a 28 y.o. female who presents for a postpartum visit. She is 6 weeks postpartum following a spontaneous vaginal delivery. I have fully reviewed the prenatal and intrapartum course. The delivery was at 3844w5d gestational weeks. Outcome: spontaneous vaginal delivery. Anesthesia: epidural. Postpartum course has been uncomplicated. Baby's course has been uncomplicated. Baby is feeding by bottle. Bleeding no bleeding. Bowel function is normal. Bladder function is normal. Patient is sexually active. Contraception method is planning for interval tubal ligation but papers were not signed during hospitalization. Postpartum depression screening: negative.  Patient with left lower abdominal pain.  Pt denies any fevers, chills, or rigors.  The following portions of the patient's history were reviewed and updated as appropriate: allergies, current medications, past family history, past medical history, past social history, past surgical history and problem list.  Review of Systems Pertinent items are noted in HPI.   Objective:  BP 102/60  Pulse 74  Temp(Src) 98.3 F (36.8 C) (Oral)  Ht 5\' 1"  (1.549 m)  Wt 113 lb (51.256 kg)  BMI 21.36 kg/m2 PE: GENERAL:  adult Caucasian female. In no discomfort; no respiratory distress  PSYCH: alert and appropriate, good insight   Abdomen:  +BS, soft, no rigidity, no guarding, no masses/hepatosplenomegaly Mild tenderness to palpation in left lower quadrant over the origin of the iliacus muscle over the left iliac crest     OSTEOPATHIC EXAM FINDINGS: LEG LENGTH:  right short leg   SOMATIC DYSFUNCTION CRANIAL  C2 rotated right, C4-6 rotated left   CERVICAL   THORACIC  T2-T7 rotated left   RIBS  left ribs 7 posterior   LUMBAR  L5 rotated left, left iliacus tender point   SACRUM  right on right   PELVIS  left anterior innominate   LE   UE         Assessment:     normal postpartum course.  Tobacco abuse Osteopathic findings  per problem list.   Plan:    1. Contraception: tubal ligation - interval use of OCPs. Discussed risks benefits of combined contraception including increased risk of blood clots (increased with smoking).  Patient agreeable and understands compliance is critical  > Followup with women's clinic to schedule interval tubal ligation 2. continue encourage smoking cessation 3. Follow up when necessary.

## 2013-08-12 NOTE — Assessment & Plan Note (Signed)
Problem Based Documentation:    Subjective Report:  Patient reporting left lower quadrant pain that is reproducible with palpation it has been present since delivery.  No perineal discomfort, pain, discharge, or difficulty with urination or bowel movements.     Assessment & Plan & Follow up Issues:   acute condition - Left iliacus contraction likely secondary to positioning during labor  Decision today to treat with OMT was based on Physical Exam.  Verbal consent was obtained after explanation of risks, benefits and potential side-effects including acute pain flare, post-manipulation soreness, and need for repeat treatments.    The Patient was treated with HVLA, ME, CS techniques in thoracic, lumbar, pelvic, sacrum and cervical areas  Patient tolerated the procedure well with improvement in symptoms.  Patient given medications, exercises, stretches and lifestyle modifications per AVS and verbally.

## 2014-01-04 ENCOUNTER — Telehealth: Payer: Self-pay | Admitting: Family Medicine

## 2014-01-04 DIAGNOSIS — J45909 Unspecified asthma, uncomplicated: Secondary | ICD-10-CM

## 2014-01-04 MED ORDER — ALBUTEROL SULFATE HFA 108 (90 BASE) MCG/ACT IN AERS: 2.0000 | INHALATION_SPRAY | Freq: Four times a day (QID) | RESPIRATORY_TRACT | Status: DC | PRN

## 2014-01-04 MED ORDER — FLUTICASONE-SALMETEROL 100-50 MCG/DOSE IN AEPB: 1.0000 | INHALATION_SPRAY | Freq: Two times a day (BID) | RESPIRATORY_TRACT | Status: DC

## 2014-01-04 NOTE — Telephone Encounter (Signed)
Needs refill on inhaler and advair [facility] [address]

## 2014-02-22 ENCOUNTER — Encounter: Payer: Self-pay | Admitting: Sports Medicine

## 2014-07-26 ENCOUNTER — Telehealth: Payer: Self-pay | Admitting: Family Medicine

## 2014-07-26 NOTE — Telephone Encounter (Signed)
Would like for clinical staff to return the call regarding a laceration within her inner lip / Ann Brown, ASA

## 2014-07-27 ENCOUNTER — Ambulatory Visit (INDEPENDENT_AMBULATORY_CARE_PROVIDER_SITE_OTHER): Payer: Medicaid Other | Admitting: Family Medicine

## 2014-07-27 ENCOUNTER — Encounter: Payer: Self-pay | Admitting: Family Medicine

## 2014-07-27 ENCOUNTER — Other Ambulatory Visit (HOSPITAL_COMMUNITY)
Admission: RE | Admit: 2014-07-27 | Discharge: 2014-07-27 | Disposition: A | Discharge status: Home / Self Care | Payer: Medicaid Other | Source: Ambulatory Visit | Attending: Family Medicine | Admitting: Family Medicine

## 2014-07-27 ENCOUNTER — Telehealth: Payer: Self-pay | Admitting: Family Medicine

## 2014-07-27 VITALS — BP 118/68 | HR 82 | Temp 97.5°F | Ht 61.0 in | Wt 106.9 lb

## 2014-07-27 DIAGNOSIS — F4322 Adjustment disorder with anxiety: Secondary | ICD-10-CM

## 2014-07-27 DIAGNOSIS — S3141XA Laceration without foreign body of vagina and vulva, initial encounter: Secondary | ICD-10-CM

## 2014-07-27 DIAGNOSIS — N898 Other specified noninflammatory disorders of vagina: Secondary | ICD-10-CM

## 2014-07-27 DIAGNOSIS — S3140XA Unspecified open wound of vagina and vulva, initial encounter: Secondary | ICD-10-CM

## 2014-07-27 DIAGNOSIS — Z113 Encounter for screening for infections with a predominantly sexual mode of transmission: Secondary | ICD-10-CM | POA: Diagnosis present

## 2014-07-27 DIAGNOSIS — Z309 Encounter for contraceptive management, unspecified: Secondary | ICD-10-CM | POA: Diagnosis not present

## 2014-07-27 LAB — POCT WET PREP (WET MOUNT): Clue Cells Wet Prep Whiff POC: NEGATIVE

## 2014-07-27 MED ORDER — FLUCONAZOLE 150 MG PO TABS: 150.0000 mg | ORAL_TABLET | Freq: Once | ORAL | Status: DC

## 2014-07-27 MED ORDER — METRONIDAZOLE 500 MG PO TABS: 500.0000 mg | ORAL_TABLET | Freq: Two times a day (BID) | ORAL | Status: DC

## 2014-07-27 NOTE — Patient Instructions (Signed)
The lacerations in your labial area are healing nicely. I don't think you needs stitches. If they become more swollen, painful, or you have drainage or are concerned they are infected please return to be seen again. Do not have sex until they're completely healed.  See the handout below and counseling resources. Also see the handout below on options for birth control. Schedule an appointment with Dr. Jarvis Newcomer to discuss birth control.  I sent in diflucan for you to take 1 pill today, repeat in 3 days if needed  I will call you if your test results are not normal.  Otherwise, I will send you a letter.  If you do not hear from me with in 2 weeks please call our office.      Be well, Dr. Pollie Meyer   [facility]   [phone]  Provides information on mental health, intellectual/developmental disabilities & substance abuse services in [facility].   COUNSELING AGENCIES (Accepts Medicaid)  [facility] [place] [address]        [phone] *[facility] [address]      [phone]  [facility] [place]  [address]  [phone] (I) [facility] [address]"   [phone] (I) [facility] [phone]  [phone] [facility] [address] [phone] (I) *[facility] [id number] [phone]   [phone] [facility] [address] [phone] [facility] for [facility]         [phone] (I) *[facility] [address]                 [phone] (I) [facility] [address]              [phone] (I) *[facility] [address]    [phone] (I) [facility] [phone] [facility] [address]  [phone] *[facility] [address]                    [phone] (I)* *[facility] [address]   [phone]  (I) Habla Espaol/Interprete  *  Psychiatric services/servicios psiquiatricos  COUNSELING- CRISIS - 24 hour availability [facility]:     [phone] [address], [place], [place] [address]   [facility] the [facility] [phone] (Domestic Violence, Rape & Victim Assistance )  [facility]   [phone] or [phone] [facility] and Crisis Services)  [phone] [place]                          Radiation protection practitioner Crisis Unit (24/7)             [phone]   [place] National Suicide Hotline    [phone] Len Childs)  [facility]   (Only from 8am-4pm)   [phone]    Contraception Choices Contraception (birth control) is the use of any methods or devices to prevent pregnancy. Below are some methods to help avoid pregnancy. HORMONAL METHODS   Contraceptive implant. This is a thin, plastic tube containing progesterone hormone. It does not contain estrogen hormone. Your health care provider inserts the tube in the inner part of the upper arm. The tube can remain in place for up to 3 years. After 3 years, the implant must be removed. The  implant prevents the ovaries from releasing an egg (ovulation), thickens the cervical mucus to prevent sperm from entering the uterus, and thins the lining of the inside of the uterus.  Progesterone-only injections. These injections are given every 3 months by your health care provider to prevent pregnancy. This synthetic progesterone hormone stops the ovaries from releasing eggs. It also thickens cervical mucus and changes the uterine lining. This makes it harder for sperm to survive in the uterus.  Birth control pills. These pills contain estrogen and progesterone hormone. They work by preventing the ovaries from releasing eggs (ovulation). They also cause the cervical mucus to thicken, preventing the sperm from entering the uterus. Birth control pills are prescribed by a health care provider.Birth  control pills can also be used to treat heavy periods.  Minipill. This type of birth control pill contains only the progesterone hormone. They are taken every day of each month and must be prescribed by your health care provider.  Birth control patch. The patch contains hormones similar to those in birth control pills. It must be changed once a week and is prescribed by a health care provider.  Vaginal ring. The ring contains hormones similar to those in birth control pills. It is left in the vagina for 3 weeks, removed for 1 week, and then a new one is put back in place. The patient must be comfortable inserting and removing the ring from the vagina.A health care provider's prescription is necessary.  Emergency contraception. Emergency contraceptives prevent pregnancy after unprotected sexual intercourse. This pill can be taken right after sex or up to 5 days after unprotected sex. It is most effective the sooner you take the pills after having sexual intercourse. Most emergency contraceptive pills are available without a prescription. Check with your pharmacist. Do not use emergency contraception as your only form of birth control. BARRIER METHODS   Female condom. This is a thin sheath (latex or rubber) that is worn over the penis during sexual intercourse. It can be used with spermicide to increase effectiveness.  Female condom. This is a soft, loose-fitting sheath that is put into the vagina before sexual intercourse.  Diaphragm. This is a soft, latex, dome-shaped barrier that must be fitted by a health care provider. It is inserted into the vagina, along with a spermicidal jelly. It is inserted before intercourse. The diaphragm should be left in the vagina for 6 to 8 hours after intercourse.  Cervical cap. This is a round, soft, latex or plastic cup that fits over the cervix and must be fitted by a health care provider. The cap can be left in place for up to 48 hours after  intercourse.  Sponge. This is a soft, circular piece of polyurethane foam. The sponge has spermicide in it. It is inserted into the vagina after wetting it and before sexual intercourse.  Spermicides. These are chemicals that kill or block sperm from entering the cervix and uterus. They come in the form of creams, jellies, suppositories, foam, or tablets. They do not require a prescription. They are inserted into the vagina with an applicator before having sexual intercourse. The process must be repeated every time you have sexual intercourse. INTRAUTERINE CONTRACEPTION  Intrauterine device (IUD). This is a T-shaped device that is put in a woman's uterus during a menstrual period to prevent pregnancy. There are 2 types:  Copper IUD. This type of IUD is wrapped in copper wire and is placed inside the uterus. Copper makes the uterus and fallopian tubes  produce a fluid that kills sperm. It can stay in place for 10 years.  Hormone IUD. This type of IUD contains the hormone progestin (synthetic progesterone). The hormone thickens the cervical mucus and prevents sperm from entering the uterus, and it also thins the uterine lining to prevent implantation of a fertilized egg. The hormone can weaken or kill the sperm that get into the uterus. It can stay in place for 3-5 years, depending on which type of IUD is used. PERMANENT METHODS OF CONTRACEPTION  Female tubal ligation. This is when the woman's fallopian tubes are surgically sealed, tied, or blocked to prevent the egg from traveling to the uterus.  Hysteroscopic sterilization. This involves placing a small coil or insert into each fallopian tube. Your doctor uses a technique called hysteroscopy to do the procedure. The device causes scar tissue to form. This results in permanent blockage of the fallopian tubes, so the sperm cannot fertilize the egg. It takes about 3 months after the procedure for the tubes to become blocked. You must use another form of  birth control for these 3 months.  Female sterilization. This is when the female has the tubes that carry sperm tied off (vasectomy).This blocks sperm from entering the vagina during sexual intercourse. After the procedure, the man can still ejaculate fluid (semen). NATURAL PLANNING METHODS  Natural family planning. This is not having sexual intercourse or using a barrier method (condom, diaphragm, cervical cap) on days the woman could become pregnant.  Calendar method. This is keeping track of the length of each menstrual cycle and identifying when you are fertile.  Ovulation method. This is avoiding sexual intercourse during ovulation.  Symptothermal method. This is avoiding sexual intercourse during ovulation, using a thermometer and ovulation symptoms.  Post-ovulation method. This is timing sexual intercourse after you have ovulated. Regardless of which type or method of contraception you choose, it is important that you use condoms to protect against the transmission of sexually transmitted infections (STIs). Talk with your health care provider about which form of contraception is most appropriate for you. Document Released: 04/09/2005 Document Revised: 04/14/2013 Document Reviewed: 10/02/2012 [facility] Patient Information 2015 [place], [place]. This information is not intended to replace advice given to you by your health care provider. Make sure you discuss any questions you have with your health care provider.

## 2014-07-27 NOTE — Telephone Encounter (Signed)
Called patient to discuss positive Trichomonas result. Sent in prescription for metronidazole. Patient was appreciative. Counseled her that her partners will also need to be treated. She should abstain from intercourse until she and any partners are treated. Also counseled on importance of not drinking alcohol while on this medication.  Ann DodrillBrittany J Dezhane Staten, MD

## 2014-07-28 LAB — CERVICOVAGINAL ANCILLARY ONLY
CHLAMYDIA, DNA PROBE: NEGATIVE
Neisseria Gonorrhea: NEGATIVE

## 2014-07-28 NOTE — Telephone Encounter (Signed)
Patient seen on 07/27/2014 by Dr, Pollie MeyerMcIntyre and was addressed at that visit

## 2014-07-29 DIAGNOSIS — Z309 Encounter for contraceptive management, unspecified: Secondary | ICD-10-CM | POA: Insufficient documentation

## 2014-07-29 NOTE — Assessment & Plan Note (Signed)
Discussed multiple options with patient today. There does not seem to be an obvious option for her that we identified during today's same day visit. She is a smoker, so I'm hesitant to prescribe anything containing estrogen. We elected to give her a handout on different methods of contraception. She will follow-up with her PCP Dr. Jarvis NewcomerGrunz to discuss in greater detail.
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
## 2014-08-04 NOTE — Progress Notes (Signed)
I was the preceptor for this visit. 

## 2014-08-05 ENCOUNTER — Telehealth: Payer: Self-pay | Admitting: Family Medicine

## 2014-08-05 NOTE — Telephone Encounter (Signed)
Pt stated that she was given an antibiotic last week.  Now her urine is dark in color, urinary frequency and having back pain.  Appt 08/06/14 at 1:45 PM with same day provider.  Clovis PuMartin, Maelyn Berrey L, RN

## 2014-08-05 NOTE — Telephone Encounter (Signed)
Urine is very dark and accompanied with lower back pain. Pt is concerned as to whether it is normal for medication that was prescribed or if she should be worried and come in for an appt. Please advise / thanks Ann BasemanSadie Reynolds, ASA

## 2014-08-06 ENCOUNTER — Ambulatory Visit: Payer: Medicaid Other | Admitting: Family Medicine

## 2014-08-27 ENCOUNTER — Ambulatory Visit: Payer: Medicaid Other | Admitting: Family Medicine

## 2014-09-24 ENCOUNTER — Encounter: Payer: Self-pay | Admitting: Family Medicine

## 2014-09-24 ENCOUNTER — Ambulatory Visit (INDEPENDENT_AMBULATORY_CARE_PROVIDER_SITE_OTHER): Payer: Medicaid Other | Admitting: Family Medicine

## 2014-09-24 VITALS — BP 124/82 | HR 95 | Temp 98.0°F | Ht 61.0 in | Wt 103.7 lb

## 2014-09-24 DIAGNOSIS — Z309 Encounter for contraceptive management, unspecified: Secondary | ICD-10-CM | POA: Diagnosis present

## 2014-09-24 DIAGNOSIS — G8929 Other chronic pain: Secondary | ICD-10-CM | POA: Insufficient documentation

## 2014-09-24 DIAGNOSIS — M545 Low back pain, unspecified: Secondary | ICD-10-CM | POA: Insufficient documentation

## 2014-09-24 MED ORDER — NORETHINDRONE 0.35 MG PO TABS: 1.0000 | ORAL_TABLET | Freq: Every day | ORAL | Status: DC

## 2014-09-24 NOTE — Progress Notes (Signed)
Subjective: Ann Brown is a 29 y.o. female patient of mine presenting for contraceptive management.  She is still smoking 1 ppd at the least and is aware that this in combination with estrogen-containing OCPs puts her at an unacceptably high risk of stroke, MI, VTE. Tried implanon x 1 year had multiple periods per month and wants to try something else. All her friends who've had Mirena had bad experiences. She does not want BTL. Is willing to try micronor and believes she is able to take this rigidly adhering to schedule.   Also has chronic low back pain without radiation that comes and goes, at times severe, not alleviated by tylenol or NSAIDs, worsened by holding infant all the time, was improved with OMM by previous PCP, Dr. Berline Choughigby.   - ROS: No migraines.  - Smoker: Has nicoderm at home and declines new interventions at this time.   Objective: BP 124/82 mmHg  Pulse 95  Temp(Src) 98 F (36.7 C) (Oral)  Ht 5\' 1"  (1.549 m)  Wt 103 lb 11.2 oz (47.038 kg)  BMI 19.60 kg/m2  LMP 08/31/2014 (Exact Date) Gen: Thin well-appearing 29 y.o. female in no distress   Assessment/Plan: Ann Brown is a 29 y.o. female here for contraceptive management and back pain.   See problem list for full plan.

## 2014-09-24 NOTE — Assessment & Plan Note (Addendum)
Difficult situation which has been reviewed with her in the past. Smoking makes risks greater than benefits with estrogen containing contraceptives. Discussed progestin-only options including mirena which she is absolutely against and declines on the basis of negative anecdotes, nexplanon which she declines as she had unacceptable bleeding with implanon, and POP which she chooses in full awareness of the rigid requirement to take at the same time every day. She will set a recurring alarm on her phone at 11:00pm when she goes to sleep. 1 year of refills ordered so she doesn't run out. Advice on actions in response to missed pill were discussed. Discussion included BTL, but though she doubts she will want more children (has a 24mo old and 355 yr old) she is not certain and we agree irreversible contraception is not a good idea.

## 2014-09-24 NOTE — Assessment & Plan Note (Signed)
Referral to sports medicine for OMM as this has been beneficial in the past. Also discussed PT but will hold on this for now.

## 2014-09-25 NOTE — Progress Notes (Signed)
One of the assigned preceptor. 

## 2014-10-13 ENCOUNTER — Encounter: Payer: Self-pay | Admitting: Sports Medicine

## 2014-10-13 ENCOUNTER — Ambulatory Visit (INDEPENDENT_AMBULATORY_CARE_PROVIDER_SITE_OTHER): Payer: Medicaid Other | Admitting: Sports Medicine

## 2014-10-13 VITALS — BP 117/82 | Ht 62.0 in | Wt 103.0 lb

## 2014-10-13 DIAGNOSIS — M9905 Segmental and somatic dysfunction of pelvic region: Secondary | ICD-10-CM | POA: Diagnosis not present

## 2014-10-13 DIAGNOSIS — T148 Other injury of unspecified body region: Secondary | ICD-10-CM

## 2014-10-13 DIAGNOSIS — M9902 Segmental and somatic dysfunction of thoracic region: Secondary | ICD-10-CM

## 2014-10-13 DIAGNOSIS — M9903 Segmental and somatic dysfunction of lumbar region: Secondary | ICD-10-CM

## 2014-10-13 DIAGNOSIS — M9901 Segmental and somatic dysfunction of cervical region: Secondary | ICD-10-CM

## 2014-10-13 DIAGNOSIS — T148XXA Other injury of unspecified body region, initial encounter: Secondary | ICD-10-CM

## 2014-10-13 DIAGNOSIS — R634 Abnormal weight loss: Secondary | ICD-10-CM

## 2014-10-13 LAB — CBC
HCT: 44.7 % (ref 36.0–46.0)
Hemoglobin: 15.2 g/dL — ABNORMAL HIGH (ref 12.0–15.0)
MCH: 34.6 pg — ABNORMAL HIGH (ref 26.0–34.0)
MCHC: 34 g/dL (ref 30.0–36.0)
MCV: 101.8 fL — AB (ref 78.0–100.0)
MPV: 9.5 fL (ref 8.6–12.4)
Platelets: 256 10*3/uL (ref 150–400)
RBC: 4.39 MIL/uL (ref 3.87–5.11)
RDW: 13.5 % (ref 11.5–15.5)
WBC: 11.4 10*3/uL — ABNORMAL HIGH (ref 4.0–10.5)

## 2014-10-13 LAB — COMPREHENSIVE METABOLIC PANEL
ALBUMIN: 4 g/dL (ref 3.5–5.2)
ALT: 8 U/L (ref 0–35)
AST: 12 U/L (ref 0–37)
Alkaline Phosphatase: 72 U/L (ref 39–117)
BUN: 8 mg/dL (ref 6–23)
CALCIUM: 9.2 mg/dL (ref 8.4–10.5)
CO2: 24 mEq/L (ref 19–32)
Chloride: 107 mEq/L (ref 96–112)
Creat: 0.64 mg/dL (ref 0.50–1.10)
GLUCOSE: 87 mg/dL (ref 70–99)
Potassium: 4 mEq/L (ref 3.5–5.3)
SODIUM: 138 meq/L (ref 135–145)
Total Bilirubin: 0.5 mg/dL (ref 0.2–1.2)
Total Protein: 6.3 g/dL (ref 6.0–8.3)

## 2014-10-13 LAB — T3: T3, Total: 88 ng/dL (ref 80.0–204.0)

## 2014-10-13 LAB — TSH: TSH: 0.649 u[IU]/mL (ref 0.350–4.500)

## 2014-10-13 LAB — T4, FREE: Free T4: 1.16 ng/dL (ref 0.80–1.80)

## 2014-10-13 MED ORDER — CYCLOBENZAPRINE HCL 10 MG PO TABS: 10.0000 mg | ORAL_TABLET | Freq: Every evening | ORAL | Status: DC | PRN

## 2014-10-22 NOTE — Progress Notes (Signed)
See addendum

## 2014-11-16 ENCOUNTER — Ambulatory Visit (INDEPENDENT_AMBULATORY_CARE_PROVIDER_SITE_OTHER): Payer: Medicaid Other | Admitting: Family Medicine

## 2014-11-16 ENCOUNTER — Encounter: Payer: Self-pay | Admitting: Family Medicine

## 2014-11-16 VITALS — BP 103/64 | HR 79 | Temp 98.1°F | Ht 61.0 in | Wt 107.3 lb

## 2014-11-16 DIAGNOSIS — K029 Dental caries, unspecified: Secondary | ICD-10-CM | POA: Diagnosis not present

## 2014-11-16 DIAGNOSIS — M545 Low back pain, unspecified: Secondary | ICD-10-CM

## 2014-11-16 MED ORDER — MELOXICAM 15 MG PO TABS: 15.0000 mg | ORAL_TABLET | Freq: Every day | ORAL | Status: DC

## 2014-11-16 NOTE — Assessment & Plan Note (Signed)
Will refer formally for her to continue treatment by her former PCP, Dr. Berline Chough.

## 2014-11-16 NOTE — Patient Instructions (Signed)
Start taking mobic once a day with or without food for the pain. You can also take tylenol but no more than 4 grams per day total. You don't need to take any ibuprofen or goody's or motrin or advil.

## 2014-11-16 NOTE — Assessment & Plan Note (Signed)
Advised to continue to wait and anticipate improvement. Rx mobic (last Cr wnl) and advised that she should not take any other NSAIDs with this and must limit tylenol to a total daily dose less than 4 grams. She will contact the dentist's office if symptoms persist or she develops signs/sxs of systemic infection.

## 2014-11-16 NOTE — Progress Notes (Signed)
Subjective: Ann Brown is a 29 y.o. female presenting for orthopedic referral and tooth pain.   Has had constant, severe aching pain at the teeth where 2 fillings were placed 1 week ago. She has taken tylenol and ibuprofen for the pain with only some relief. The dentist office reportedly stated that the teeth would be tender and that this discomfort she is in should get better on its own. They prescribed no medications.   Has chronic back pain treated by Dr. Gaspar Bidding. She would like a formal referral to him at his new office at [facility]. No change in her pain recently.  - ROS: No fever, chills, facial or sinus pain, no dysphagia. No current bowel/bladder problems, unintentional weight loss, night time awakenings secondary to pain, weakness in one or both legs - Smoker: still trying to use patches "every now and then." Does not want to discuss this at this visit.   Objective: BP 103/64 mmHg  Pulse 79  Temp(Src) 98.1 F (36.7 C) (Oral)  Ht  (1.549 m)  Wt 107 lb 4.8 oz (48.671 kg)  BMI 20.28 kg/m2  LMP  Gen: 29 y.o. female in no distress HEENT: Normocephalic, sclerae clear, conjunctivae normal, pupils equal and reactive, nares normal without tenderness to sinus percussion. Moist mucous membranes, posterior oropharynx clear, poor dentition without focal swelling or erythema.   Assessment/Plan: Ann Brown is a 29 y.o. female here for toothache and back pain.  See problem list for plan.

## 2015-02-28 ENCOUNTER — Telehealth: Payer: Self-pay | Admitting: Family Medicine

## 2015-02-28 ENCOUNTER — Ambulatory Visit (INDEPENDENT_AMBULATORY_CARE_PROVIDER_SITE_OTHER): Payer: Medicaid Other | Admitting: Family Medicine

## 2015-02-28 VITALS — BP 118/68 | HR 63 | Temp 97.7°F | Ht 61.0 in | Wt 107.8 lb

## 2015-02-28 DIAGNOSIS — Z309 Encounter for contraceptive management, unspecified: Secondary | ICD-10-CM

## 2015-02-28 DIAGNOSIS — J209 Acute bronchitis, unspecified: Secondary | ICD-10-CM

## 2015-02-28 DIAGNOSIS — F17200 Nicotine dependence, unspecified, uncomplicated: Secondary | ICD-10-CM

## 2015-02-28 DIAGNOSIS — Z23 Encounter for immunization: Secondary | ICD-10-CM

## 2015-02-28 MED ORDER — PREDNISONE 20 MG PO TABS: 40.0000 mg | ORAL_TABLET | Freq: Every day | ORAL | Status: DC

## 2015-02-28 MED ORDER — NORELGESTROMIN-ETH ESTRADIOL 150-35 MCG/24HR TD PTWK: 1.0000 | MEDICATED_PATCH | TRANSDERMAL | Status: DC

## 2015-02-28 NOTE — Telephone Encounter (Signed)
Medication resent to [facility].  Transmission failed the first time.  Clovis PuMartin, Tamika L, RN

## 2015-02-28 NOTE — Telephone Encounter (Signed)
Pt called because the pharmacy didn't received the prescription for her Prednisone. Epic shows transmission fail. Can we call this in. jw

## 2015-03-01 ENCOUNTER — Telehealth: Payer: Self-pay | Admitting: Family Medicine

## 2015-03-01 NOTE — Telephone Encounter (Signed)
Did the [facility] for the [facility] bleeding get sent in?

## 2015-03-01 NOTE — Progress Notes (Signed)
Subjective: Ann Brown is a 29 y.o. female here for contraceptive management and cough.  She has been on the minipill and reports frequent spotting and continuous ~1 pad per day vaginal bleeding for the past month. No vaginal discharge, interruptions in medications, breast tenderness, N/V. She again declines mirena, had same symptoms with implanon x 1 year, but had good experience with contraceptive patch in the past. No migraine with aura.   She's reports have a cough productive of white sputum constantly for the past 2 - 3 weeks preceded by a runny nose after her children displayed similar symptoms. She has used advair and albuterol as needed (increased recent use) and cut down on smoking due to trouble breathing. She denies fevers, wheezing, hemoptysis and chest pain. She has had flares of acute bronchitis requiring steroids in the past but never a hospitalization.   - She is a ~1 ppd smoker. Pre-contemplative.  - PMH: chronic bronchitis - ROS: As above. All other pertinent systems reviewed and are negative.  Objective: BP 118/68 mmHg  Pulse 63  Temp(Src) 97.7 F (36.5 C) (Oral)  Ht 5\' 1"  (1.549 m)  Wt 107 lb 12.8 oz (48.898 kg)  BMI 20.38 kg/m2  SpO2 99%  LMP 01/27/2015 Gen:  29 y.o. female in no distress HEENT: MMM, TMs with normal position without inflammation or effusion. EOMI, PERRL, conjunctivae normal. Oropharynx erythematous without exudates. CV: RRR, no murmur, no JVD Resp: Non-labored, good air movement with scattered wheezes and rhonchi noted. No focal consolidation. No crackles.  Skin: No exanthem noted  Assessment & Plan: Ann Brown is a 29 y.o. female with acute bronchitis. TOBACCO USER Remains precontemplative, will return outside acute illness when prepared to discuss cessation strategies and resources.   Acute bronchitis Rx quick steroid burst for this episode sparked by viral URI. No role for abx or CXR at this time. Return after 5

## 2015-03-01 NOTE — Assessment & Plan Note (Signed)
Rx quick steroid burst for this episode sparked by viral URI. No role for abx or CXR at this time. Return after 5

## 2015-03-01 NOTE — Assessment & Plan Note (Signed)
Remains precontemplative, will return outside acute illness when prepared to discuss cessation strategies and resources.

## 2015-03-02 NOTE — Telephone Encounter (Signed)
LMOVM for pt to return call.  Need more info.  1. When was this requested ( I do not see a previous note) 2. How long and how much has she been bleeding?  Tene Gato, Ann RochesterJessica Dawn, CMA

## 2015-03-03 NOTE — Telephone Encounter (Signed)
Pt is returning call, says she discussed with the doctor at her last visit on 02/28/15, says it may have slipped the doctor's mind because she was here with her two kids. Patient says she has been bleeding since 10/6 and is going through 2 boxes of tampons per week.

## 2015-03-03 NOTE — Telephone Encounter (Signed)
Will forward to Dr. Jarvis NewcomerGrunz. Maziah Smola,CMA

## 2015-03-04 NOTE — Telephone Encounter (Signed)
We started orthoevra at the last office visit in response to this complaint so I'm not sure what else she may be talking about. I attempted to call, left a voicemail telling her to call back if she still has questions regarding this.

## 2015-04-06 ENCOUNTER — Ambulatory Visit (INDEPENDENT_AMBULATORY_CARE_PROVIDER_SITE_OTHER): Payer: Medicaid Other | Admitting: Family Medicine

## 2015-04-06 ENCOUNTER — Encounter: Payer: Self-pay | Admitting: Family Medicine

## 2015-04-06 ENCOUNTER — Other Ambulatory Visit (HOSPITAL_COMMUNITY)
Admission: RE | Admit: 2015-04-06 | Discharge: 2015-04-06 | Disposition: A | Discharge status: Home / Self Care | Payer: Medicaid Other | Source: Ambulatory Visit | Attending: Family Medicine | Admitting: Family Medicine

## 2015-04-06 ENCOUNTER — Telehealth: Payer: Self-pay

## 2015-04-06 VITALS — BP 128/84 | HR 81 | Temp 97.8°F | Ht 61.0 in | Wt 108.2 lb

## 2015-04-06 DIAGNOSIS — N898 Other specified noninflammatory disorders of vagina: Secondary | ICD-10-CM | POA: Diagnosis present

## 2015-04-06 DIAGNOSIS — N760 Acute vaginitis: Secondary | ICD-10-CM

## 2015-04-06 DIAGNOSIS — F17200 Nicotine dependence, unspecified, uncomplicated: Secondary | ICD-10-CM

## 2015-04-06 DIAGNOSIS — Z202 Contact with and (suspected) exposure to infections with a predominantly sexual mode of transmission: Secondary | ICD-10-CM

## 2015-04-06 DIAGNOSIS — B9689 Other specified bacterial agents as the cause of diseases classified elsewhere: Secondary | ICD-10-CM

## 2015-04-06 DIAGNOSIS — A499 Bacterial infection, unspecified: Secondary | ICD-10-CM | POA: Diagnosis not present

## 2015-04-06 DIAGNOSIS — Z113 Encounter for screening for infections with a predominantly sexual mode of transmission: Secondary | ICD-10-CM | POA: Diagnosis present

## 2015-04-06 LAB — POCT WET PREP (WET MOUNT): CLUE CELLS WET PREP WHIFF POC: NEGATIVE

## 2015-04-06 MED ORDER — METRONIDAZOLE 500 MG PO TABS: 500.0000 mg | ORAL_TABLET | Freq: Two times a day (BID) | ORAL | Status: DC

## 2015-04-06 NOTE — Progress Notes (Signed)
   Subjective: CC: vaginal discharge ZOX:[id number]:Paige N Cedric FishmanSharpe is a 29 y.o. female presenting to clinic today for same day appointment. PCP: Hazeline Junkeryan Grunz, MD Concerns today include:  1. Vaginal Discharge Patient reports that discharge started about 2 days ago.  She notes that discharge is white, now on period so not sure.  She endorses vaginal odors and itching.  She denies abnormal vaginal bleeding, dysuria, hematuria, pelvic pain, nausea, vomiting, fevers.   She has not used any OTCs.  no history of STIs.  She is sexually active and yes use condoms.  Contraception: condoms, ortho evra.  Patient's last menstrual period was 04/06/2015.  Social History Reviewed: active smoker.  [place], 0.5 ppd.  To start patches soon FamHx and MedHx reviewed.  Please see EMR. Health Maintenance.  ROS: Per HPI  Objective: Office vital signs reviewed. BP 128/84 mmHg  Pulse 81  Temp(Src) 97.8 F (36.6 C) (Oral)  Ht 5\' 1"  (1.549 m)  Wt 108 lb 3.2 oz (49.079 kg)  BMI 20.45 kg/m2  SpO2 99%  LMP 04/06/2015  Physical Examination:  General: Awake, alert, thin, No acute distress, profound smell of tobacco HEENT: Normal, MMM GU: external vaginal tissue normal, cervix anterverted, no punctate lesions on cervix appreciated, moderate discharge from cervical os, moderate bleeding, no cervical motion tenderness, no abdominal/ adnexal masses MSK: Normal gait and station   Results for orders placed or performed in visit on 04/06/15 (from the past 24 hour(s))  POCT Wet Prep Mellody Drown(Wet [place])     Status: Abnormal   Collection Time: 04/06/15  4:28 PM  Result Value Ref Range   Source Wet Prep POC VAG    WBC, Wet Prep HPF POC 1-5    Bacteria Wet Prep HPF POC Few None, Few, Too numerous to count   Clue Cells Wet Prep HPF POC Few (A) None, Too numerous to count   Clue Cells Wet Prep Whiff POC Negative Whiff    Yeast Wet Prep HPF POC None    Trichomonas Wet Prep HPF POC NONE     Assessment/ Plan: 29 y.o. female   1.  Vaginal discharge.  Wet prep suggestive of BV - POCT Wet Prep [employer](Wet [place])  2. Possible exposure to STD - POCT Wet Prep [facility](Wet [place]) - HIV antibody - RPR - Cervicovaginal ancillary only GC/CT - Will contact with results  3. Bacterial vaginosis - Called patient to discuss result - metroNIDAZOLE (FLAGYL) 500 MG tablet; Take 1 tablet (500 mg total) by mouth 2 (two) times daily. x7 days  Dispense: 14 tablet; Refill: 0  4. TOBACCO USER, Action phase - Patient has nicotine patches - She is to start patches and follow up with PCP   Raliegh IpAshly M Elizabella Nolet, DO PGY-2, [facility]

## 2015-04-06 NOTE — Telephone Encounter (Signed)
Pt returned Dr. Otis Dialsall. I told her the results from the wet prep and that Flagyl has been called in. Pt understood.  Sunday SpillersSharon T Saunders, CMA

## 2015-04-07 LAB — RPR

## 2015-04-07 LAB — HIV ANTIBODY (ROUTINE TESTING W REFLEX): HIV 1&2 Ab, 4th Generation: NONREACTIVE

## 2015-04-07 LAB — CERVICOVAGINAL ANCILLARY ONLY
Chlamydia: NEGATIVE
NEISSERIA GONORRHEA: NEGATIVE

## 2015-04-08 ENCOUNTER — Telehealth: Payer: Self-pay | Admitting: Family Medicine

## 2015-04-08 NOTE — Telephone Encounter (Signed)
Contacted and given lab results obtained at last office visit.

## 2015-06-17 ENCOUNTER — Ambulatory Visit: Payer: Medicaid Other | Admitting: Family Medicine

## 2015-06-23 ENCOUNTER — Ambulatory Visit: Payer: Medicaid Other | Admitting: Family Medicine

## 2015-08-04 ENCOUNTER — Encounter: Payer: Self-pay | Admitting: Family Medicine

## 2015-08-04 ENCOUNTER — Ambulatory Visit (INDEPENDENT_AMBULATORY_CARE_PROVIDER_SITE_OTHER): Payer: Medicaid Other | Admitting: Family Medicine

## 2015-08-04 VITALS — BP 105/68 | HR 88 | Temp 98.1°F | Ht 61.0 in | Wt 109.0 lb

## 2015-08-04 DIAGNOSIS — Z309 Encounter for contraceptive management, unspecified: Secondary | ICD-10-CM

## 2015-08-04 DIAGNOSIS — N9089 Other specified noninflammatory disorders of vulva and perineum: Secondary | ICD-10-CM

## 2015-08-04 DIAGNOSIS — N907 Vulvar cyst: Secondary | ICD-10-CM

## 2015-08-04 DIAGNOSIS — L821 Other seborrheic keratosis: Secondary | ICD-10-CM

## 2015-08-04 MED ORDER — NORGESTIMATE-ETH ESTRADIOL 0.25-35 MG-MCG PO TABS: 1.0000 | ORAL_TABLET | Freq: Every day | ORAL | Status: DC

## 2015-08-04 NOTE — Progress Notes (Signed)
Subjective: Ann FloodVictoria N Brown is a 30 y.o. female presenting for skin lesions.   She notes a "mole" on her left buttock for the past several years that gets caught on jeans when she pulls them up and hurts. It has not changed in size, color, and doesn't itch or bleed.   She has a history of labial skin tags which have been removed and reports a slow growing tag on the right labia for a year which gets irritated with tight fitting clothing or intercourse.   - ROS: As above.  - + smoker, no family or personal history of melanoma.   Objective: BP 105/68 mmHg  Pulse 88  Temp(Src) 98.1 F (36.7 C) (Oral)  Ht 5\' 1"  (1.549 m)  Wt 109 lb (49.442 kg)  BMI 20.61 kg/m2  LMP 07/19/2015 (Approximate) Gen: Well-appearing 30 y.o. female in no distress Skin:  - Solitary ~0.75cm well-demarcated, raised, oval lesion with dull, verrucous surface appearing "stuck-on" to inferior left buttock.  - Solitary pedunculated < 1 cm acrochordon without verrucous appearance on posterior right labia minora with mild irritation at the base. No other ulcers or lesions noted.   PROCEDURE NOTE:  Excision of right labial skin tag:  Skin tag snipped off using alcohol swab for cleansing and sterile iris scissors. Local anesthesia was accomplished with cold spray. Bleeding was minimal, controlled with gauze.  Clemencia CourseShari Saunders, CMA present throughout encounter.   PROCEDURE NOTE:  Cryotherapy of sebK on inferior left buttock:  Area treated with cryotherapy to create ice ball extending 2mm beyond lesion for < 10 sec twice. Patient tolerated procedure well.    Assessment/Plan: Ann Brown is a 30 y.o. female here for skin lesions consistent with seborrheic keratosis, treated with cryotherapy, and acrochordon vs. genital wart treated by excision.  Labial skin tag: Discussed possibility of HPV causing this, though this would not significantly impact management, so this will not be sent for biopsy.   Seborrheic  keratosis: Pathognomonic appearance as described above very reassuring against malignancy. The patient was counseled, however, that if lesion return it will require biopsy.   Contraception management Pt denies recent sexual activity, desires OCP despite understanding warnings of increased risk of DVT/PE which could possibly lead to death. Rx sprintec and reviewed S/Sxs of DVT/PE as well as precautions to return for care.

## 2015-08-04 NOTE — Patient Instructions (Signed)
Thank you for coming in today!  - If the area on your left bottom returns after blistering up and falling off, you should return to have it removed.  - Keep the wound clean.  - Schedule an appointment with Dr. Berline Choughigby to evaluate your right shoulder.  - I have refilled your birth control pill.   Our clinic's number is [phone]. Feel free to call any time with questions or concerns. We will answer any questions after hours with our 24-hour emergency line at that number as well.   - Dr. Jarvis NewcomerGrunz

## 2015-08-05 NOTE — Assessment & Plan Note (Signed)
Pt denies recent sexual activity, desires OCP despite understanding warnings of increased risk of DVT/PE which could possibly lead to death. Rx sprintec and reviewed S/Sxs of DVT/PE as well as precautions to return for care.

## 2015-08-08 ENCOUNTER — Telehealth: Payer: Self-pay | Admitting: Family Medicine

## 2015-08-08 NOTE — Telephone Encounter (Signed)
Mrs Ann Brown called to say that the birth control medicine Sprintec was the incorrect medication.  Cannot not take that. Need to send in new rx for the Norethindrone.  Cannot take the estradiol type.

## 2015-08-09 MED ORDER — NORETHINDRONE 0.35 MG PO TABS: 1.0000 | ORAL_TABLET | Freq: Every day | ORAL | Status: DC

## 2015-12-20 ENCOUNTER — Other Ambulatory Visit: Payer: Self-pay | Admitting: *Deleted

## 2015-12-20 DIAGNOSIS — J45909 Unspecified asthma, uncomplicated: Secondary | ICD-10-CM

## 2015-12-22 MED ORDER — ALBUTEROL SULFATE HFA 108 (90 BASE) MCG/ACT IN AERS: 2.0000 | INHALATION_SPRAY | Freq: Four times a day (QID) | RESPIRATORY_TRACT | 2 refills | Status: DC | PRN

## 2015-12-24 ENCOUNTER — Encounter (HOSPITAL_COMMUNITY): Payer: Self-pay

## 2015-12-24 ENCOUNTER — Emergency Department (HOSPITAL_COMMUNITY)
Admission: EM | Admit: 2015-12-24 | Discharge: 2015-12-24 | Disposition: A | Discharge status: Home / Self Care | Payer: Medicaid Other | Attending: Emergency Medicine | Admitting: Emergency Medicine

## 2015-12-24 DIAGNOSIS — F17210 Nicotine dependence, cigarettes, uncomplicated: Secondary | ICD-10-CM | POA: Diagnosis not present

## 2015-12-24 DIAGNOSIS — K0889 Other specified disorders of teeth and supporting structures: Secondary | ICD-10-CM | POA: Diagnosis present

## 2015-12-24 DIAGNOSIS — Z79899 Other long term (current) drug therapy: Secondary | ICD-10-CM | POA: Insufficient documentation

## 2015-12-24 DIAGNOSIS — J45909 Unspecified asthma, uncomplicated: Secondary | ICD-10-CM | POA: Insufficient documentation

## 2015-12-24 MED ORDER — ACETAMINOPHEN 325 MG PO TABS: 650.0000 mg | ORAL_TABLET | Freq: Four times a day (QID) | ORAL | 0 refills | Status: DC | PRN

## 2015-12-24 MED ORDER — NAPROXEN 500 MG PO TABS: 500.0000 mg | ORAL_TABLET | Freq: Two times a day (BID) | ORAL | 0 refills | Status: DC

## 2015-12-24 MED ORDER — HYDROCODONE-ACETAMINOPHEN 5-325 MG PO TABS
1.0000 | ORAL_TABLET | Freq: Once | ORAL | Status: AC
  Administered 2015-12-24: 1 via ORAL
  Filled 2015-12-24: qty 1

## 2015-12-24 MED ORDER — PENICILLIN V POTASSIUM 250 MG PO TABS
500.0000 mg | ORAL_TABLET | Freq: Once | ORAL | Status: AC
  Administered 2015-12-24: 500 mg via ORAL
  Filled 2015-12-24: qty 2

## 2015-12-24 MED ORDER — PENICILLIN V POTASSIUM 500 MG PO TABS: 500.0000 mg | ORAL_TABLET | Freq: Four times a day (QID) | ORAL | 0 refills | Status: AC

## 2015-12-24 NOTE — Discharge Instructions (Signed)
Follow up with your oral surgeon as soon as possible. Take antibiotics as prescribed. Alternate between tylenol and naproxen as needed for pain.

## 2015-12-24 NOTE — ED Triage Notes (Signed)
Patient here with right lower dental pain x 3-4 days. Had back molar extracted this past monday and states increased pain with same. No antibiotic given, out of pain meds

## 2015-12-24 NOTE — ED Notes (Signed)
Declined W/C at D/C and was escorted to lobby by RN. 

## 2015-12-24 NOTE — ED Provider Notes (Signed)
[facility]-EMERGENCY DEPT Provider Note   CSN: [id number] Arrival date & time: 12/24/15  1407 By signing my name below, I, Ann Brown, attest that this documentation has been prepared under the direction and in the presence of [name], [place]. Electronically Signed: Bridgette Brown, ED Scribe. 12/24/15. 2:39 PM.  History   Chief Complaint Chief Complaint  Patient presents with  . Dental Problem   HPI Comments: Ann Brown is a 30 y.o. female who presents to the Emergency Department complaining of sudden onset, constant, throbbing, 9/10 right lower dental pain radiating to her right ear onset 4 days ago. Pt had her back molar extracted 5 days ago and states pain around that area has increased. Pain is exacerbated with chewing and talking. Pt was not given any antibiotics by her dentist and she is out of her Vicodin. She has taken Ibuprofen with no relief. She spoke with her dentist and states she could not get an appointment. Denies fever, chills, or any other associated symptoms.   The history is provided by the patient. No language interpreter was used.    Past Medical History:  Diagnosis Date  . Asthma   . Breast mass    fibroadenomas -   . GERD (gastroesophageal reflux disease)   . Kidney problem    Kideney reflux  . Tobacco use   . URI 03/01/2009    Patient Active Problem List   Diagnosis Date Noted  . Dental caries 11/16/2014  . Bilateral low back pain without sciatica 09/24/2014  . Contraception management 07/29/2014  . Acute bronchitis 03/30/2013  . History of thyroid disease 02/24/2013  . ASCUS on Pap smear 02/01/2013  . Gastroesophageal reflux 12/12/2010  . Asthma, chronic 12/12/2010  . Adjustment disorder with anxiety 02/28/2010  . TOBACCO USER 04/25/2009    Past Surgical History:  Procedure Laterality Date  . BOWEL RESECTION    . CHOLECYSTECTOMY    . Complex trunk closure  2005  . CSF SHUNT  1987  . TRACHEOSTOMY  1987    OB History    Gravida Para Term  Preterm AB Living   3 2 2   1 2    SAB TAB Ectopic Multiple Live Births           2       Home Medications    Prior to Admission medications   Medication Sig Start Date End Date Taking? Authorizing Provider  albuterol (VENTOLIN HFA) 108 (90 Base) MCG/ACT inhaler Inhale 2 puffs into the lungs every 6 (six) hours as needed for wheezing. 12/22/15   Howard Pouch, MD  cyclobenzaprine (FLEXERIL) 10 MG tablet Take 1 tablet (10 mg total) by mouth at bedtime as needed for muscle spasms. 10/13/14   [name], DO  esomeprazole (NEXIUM) 40 MG capsule Take 1 capsule (40 mg total) by mouth daily. 05/07/13   [name], DO  Fluticasone-Salmeterol (ADVAIR) 100-50 MCG/DOSE AEPB Inhale 1 puff into the lungs 2 (two) times daily. 01/04/14   Tyrone Nine, MD  meloxicam (MOBIC) 15 MG tablet Take 1 tablet (15 mg total) by mouth daily. 11/16/14   Tyrone Nine, MD  metroNIDAZOLE (FLAGYL) 500 MG tablet Take 1 tablet (500 mg total) by mouth 2 (two) times daily. x7 days 04/06/15   Raliegh Ip, DO  nicotine (NICODERM CQ - DOSED IN MG/24 HOURS) 21 mg/24hr patch Place 1 patch (21 mg total) onto the skin daily. [facility] [phone] 07/10/13   Moses Manners, MD  norethindrone [facility])  0.35 MG tablet Take 1 tablet (0.35 mg total) by mouth daily. 08/09/15   Tyrone Nine, MD  predniSONE (DELTASONE) 20 MG tablet Take 2 tablets (40 mg total) by mouth daily with breakfast. 02/28/15   Tyrone Nine, MD    Family History Family History  Problem Relation Age of Onset  . Multiple sclerosis Mother   . Cervical cancer Mother     Social History Social History  Substance Use Topics  . Smoking status: Current Every Day Smoker    Packs/day: 1.00    Types: Cigarettes  . Smokeless tobacco: Never Used     Comment: Also uses ecigarette  . Alcohol use No     Allergies   Review of patient's allergies indicates no known allergies.   Review of Systems Review of Systems 10 Systems reviewed and all are  negative for acute change except as noted in the HPI. Physical Exam Updated Vital Signs BP 116/88 (BP Location: Right Arm)   Pulse 72   Temp 98.5 F (36.9 C) (Oral)   Resp 18   SpO2 99%   Physical Exam  Constitutional: She appears well-developed and well-nourished.  HENT:  Head: Normocephalic and atraumatic.  Tooth #30 has been extracted, the surrounding gingiva is boggy. Scant purulent exudate from socket. Uvula midline, no trismus, no facial edema.  Eyes: Conjunctivae are normal.  Cardiovascular: Normal rate.   Pulmonary/Chest: Effort normal. No respiratory distress.  Abdominal: She exhibits no distension.  Musculoskeletal: Normal range of motion.  Neurological: She is alert.  Skin: Skin is warm and dry.  Psychiatric: She has a normal mood and affect. Her behavior is normal.  Nursing note and vitals reviewed.  ED Treatments / Results  DIAGNOSTIC STUDIES: Oxygen Saturation is 99% on RA, normal by my interpretation.    COORDINATION OF CARE: 2:34 PM Discussed treatment plan with pt at bedside which includes Toradol shot and pt agreed to plan.  Labs (all labs ordered are listed, but only abnormal results are displayed) Labs Reviewed - No data to display  EKG  EKG Interpretation None       Radiology No results found.  Procedures Procedures (including critical care time)  Medications Ordered in ED Medications - No data to display   Initial Impression / Assessment and Plan / ED Course  I have reviewed the triage vital signs and the nursing notes.  Pertinent labs & imaging results that were available during my care of the patient were reviewed by me and considered in my medical decision making (see chart for details).  Clinical Course   Pt with persistent pain and some extraction site infection after extraction of tooth #30 several days ago. No fever, no evidence of deep space infection. She is afebrile and nontoxic appearing. I discussed with pt that I cannot  give rx for narcotics for her dental pain but iwll start her on a course of antibiotics and encouraged her to f/u with her dentist after the weekend. No abscess requiring immediate incision and drainage.  Exam not concerning for Ludwig's angina or pharyngeal abscess.  Discussed return precautions. Pt safe for discharge.   Final Clinical Impressions(s) / ED Diagnoses   Final diagnoses:  Pain, dental    New Prescriptions Discharge Medication List as of 12/24/2015  3:54 PM    START taking these medications   Details  acetaminophen (TYLENOL) 325 MG tablet Take 2 tablets (650 mg total) by mouth every 6 (six) hours as needed., Starting Sat 12/24/2015, Print  naproxen (NAPROSYN) 500 MG tablet Take 1 tablet (500 mg total) by mouth 2 (two) times daily., Starting Sat 12/24/2015, Print    penicillin v potassium (VEETID) 500 MG tablet Take 1 tablet (500 mg total) by mouth 4 (four) times daily., Starting Sat 12/24/2015, Until Sat 12/31/2015, Print       I personally performed the services described in this documentation, which was scribed in my presence. The recorded information has been reviewed and is accurate.    Carlene CoriaSerena Y Malkia Nippert, PA-C 12/25/15 Eula [id number]    Chidubem Chaires Jacubowitz, MD 12/26/15 [phone]

## 2015-12-29 ENCOUNTER — Ambulatory Visit: Payer: Medicaid Other | Admitting: Family Medicine

## 2016-02-22 ENCOUNTER — Other Ambulatory Visit (HOSPITAL_COMMUNITY)
Admission: RE | Admit: 2016-02-22 | Discharge: 2016-02-22 | Disposition: A | Discharge status: Home / Self Care | Payer: Medicaid Other | Source: Ambulatory Visit | Attending: Family Medicine | Admitting: Family Medicine

## 2016-02-22 ENCOUNTER — Ambulatory Visit (INDEPENDENT_AMBULATORY_CARE_PROVIDER_SITE_OTHER): Payer: Medicaid Other | Admitting: Family Medicine

## 2016-02-22 ENCOUNTER — Encounter: Payer: Self-pay | Admitting: Family Medicine

## 2016-02-22 VITALS — BP 107/63 | HR 76 | Temp 98.3°F | Ht 61.0 in | Wt 104.8 lb

## 2016-02-22 DIAGNOSIS — Z01419 Encounter for gynecological examination (general) (routine) without abnormal findings: Secondary | ICD-10-CM | POA: Diagnosis present

## 2016-02-22 DIAGNOSIS — J4530 Mild persistent asthma, uncomplicated: Secondary | ICD-10-CM

## 2016-02-22 DIAGNOSIS — Z1151 Encounter for screening for human papillomavirus (HPV): Secondary | ICD-10-CM | POA: Diagnosis present

## 2016-02-22 DIAGNOSIS — Z124 Encounter for screening for malignant neoplasm of cervix: Secondary | ICD-10-CM | POA: Diagnosis not present

## 2016-02-22 DIAGNOSIS — N898 Other specified noninflammatory disorders of vagina: Secondary | ICD-10-CM | POA: Diagnosis not present

## 2016-02-22 DIAGNOSIS — Q627 Congenital vesico-uretero-renal reflux: Secondary | ICD-10-CM

## 2016-02-22 LAB — POCT WET PREP (WET MOUNT)
Clue Cells Wet Prep Whiff POC: POSITIVE
TRICHOMONAS WET PREP HPF POC: ABSENT

## 2016-02-22 MED ORDER — METRONIDAZOLE 500 MG PO TABS: 500.0000 mg | ORAL_TABLET | Freq: Two times a day (BID) | ORAL | 0 refills | Status: DC

## 2016-02-22 MED ORDER — ALBUTEROL SULFATE HFA 108 (90 BASE) MCG/ACT IN AERS: 2.0000 | INHALATION_SPRAY | Freq: Four times a day (QID) | RESPIRATORY_TRACT | 3 refills | Status: DC | PRN

## 2016-02-22 MED ORDER — FLUTICASONE-SALMETEROL 100-50 MCG/DOSE IN AEPB: 1.0000 | INHALATION_SPRAY | Freq: Two times a day (BID) | RESPIRATORY_TRACT | 5 refills | Status: DC

## 2016-02-22 MED ORDER — ESOMEPRAZOLE MAGNESIUM 40 MG PO CPDR: 40.0000 mg | DELAYED_RELEASE_CAPSULE | Freq: Every day | ORAL | 3 refills | Status: DC

## 2016-02-22 MED ORDER — NORETHINDRONE 0.35 MG PO TABS: 1.0000 | ORAL_TABLET | Freq: Every day | ORAL | 11 refills | Status: DC

## 2016-02-22 NOTE — Progress Notes (Signed)
Subjective:     Patient ID: Ann Brown, female   DOB: 12/25/1985, 30 y.o.   MRN: [id number]  HPI Ann Brown is a 30 year old female presenting today for multiple concerns. # Medication Refill: -Requests refill of Nexium, albuterol, Advair, birth control -Medications reviewed and updated -Advair last refill in 2015. Suspect not taking twice daily as prescribed. -Using albuterol approximately 1-2 times a day. Reports it is still working well. Mostly uses when she is out in cold weather.  # Vaginal Discharge: -Notes vaginal itching, odor, white discharge the last several days -Reports she has one sexual partner for 2 years. They're both in a monogamous relationship. -Denies vaginal bleeding/spotting, genital ulcers -Reports history of bacterial vaginosis and yeast infection. Reports this feels similar  # Health Maintenance: -Last Pap smear October 2014 showing ASCUS with negative HPV. Currently due for Pap smear. -Reports history of kidney reflux, stating "my kidney stem does not meet with my bladder all the way." Reports it has been a while since she has been fully evaluated by nephrology. Requests referral to Nephrology to re-establish.   Review of Systems Per HPI    Objective:   Physical Exam  Constitutional: She appears well-developed and well-nourished. No distress.  Cardiovascular: Normal rate and regular rhythm.   No murmur heard. Pulmonary/Chest: Effort normal. No respiratory distress. She has no wheezes.  Abdominal: Soft. She exhibits no distension. There is no tenderness.  Genitourinary:  Genitourinary Comments: White vaginal discharge noted. No adnexal tenderness. No cervical motion tenderness. No vaginal wall tenderness noted  Musculoskeletal: She exhibits no edema.  Psychiatric: She has a normal mood and affect. Her behavior is normal.      Assessment and Plan:     1. Asthma: Stable - Refills for Albuterol and Advair sent to pharmacy. Discussed Advair should be  used twice daily - Return as needed for flares, medication refills.  2. Vaginal Discharge: - Wet Prep with moderate bacteria and clue cells with positive whiff, indicating Bacterial Vaginosis.  - Prescription for Metronidazole sent to pharmacy.  - Return if symptoms worsen or fail to resolve.  3. Health Maintenance: - Pap Smear obtained. - Previously followed with Nephrology for Renal Reflux. Requests to re-establish. Referral placed.

## 2016-02-22 NOTE — Patient Instructions (Addendum)
Thank you so much for coming to visit today! We did a pap smear and wet prep today. You will be contacted with the results. If your pap smear is abnormal, you may need to return for colposcopy. We refilled your Nexium, Albuterol, Advair, and birth control today. I have placed a referral to Nephrology. You should hear from them soon. Please return in 4-8 weeks so we can remeasure your lymph node in your left groin (currently 1cmx1cm). If you notice any changes, please let us know.  Dr. Caroleen Hammanumley

## 2016-02-23 LAB — CYTOLOGY - PAP
DIAGNOSIS: NEGATIVE
HPV: NOT DETECTED

## 2016-03-01 ENCOUNTER — Telehealth: Payer: Self-pay | Admitting: Student in an Organized Health Care Education/Training Program

## 2016-03-01 NOTE — Telephone Encounter (Signed)
Called to inform patient that Pap smear was normal. Spoke with patient.  Ann PouchLauren Emorie Mcfate, MD

## 2016-03-01 NOTE — Telephone Encounter (Signed)
Would like results from 02-22-16 labs

## 2016-05-24 ENCOUNTER — Encounter: Payer: Self-pay | Admitting: Sports Medicine

## 2016-05-24 ENCOUNTER — Ambulatory Visit: Payer: Medicaid Other | Admitting: Sports Medicine

## 2016-07-06 ENCOUNTER — Encounter (HOSPITAL_COMMUNITY): Payer: Self-pay | Admitting: *Deleted

## 2016-07-06 ENCOUNTER — Emergency Department (HOSPITAL_COMMUNITY)
Admission: EM | Admit: 2016-07-06 | Discharge: 2016-07-07 | Disposition: A | Discharge status: Home / Self Care | Payer: Medicaid Other | Attending: Emergency Medicine | Admitting: Emergency Medicine

## 2016-07-06 DIAGNOSIS — F4329 Adjustment disorder with other symptoms: Secondary | ICD-10-CM | POA: Diagnosis present

## 2016-07-06 DIAGNOSIS — Z79899 Other long term (current) drug therapy: Secondary | ICD-10-CM | POA: Diagnosis not present

## 2016-07-06 DIAGNOSIS — F17210 Nicotine dependence, cigarettes, uncomplicated: Secondary | ICD-10-CM | POA: Insufficient documentation

## 2016-07-06 DIAGNOSIS — J45909 Unspecified asthma, uncomplicated: Secondary | ICD-10-CM | POA: Insufficient documentation

## 2016-07-06 DIAGNOSIS — R45851 Suicidal ideations: Secondary | ICD-10-CM | POA: Diagnosis not present

## 2016-07-06 DIAGNOSIS — F329 Major depressive disorder, single episode, unspecified: Secondary | ICD-10-CM | POA: Insufficient documentation

## 2016-07-06 LAB — RAPID URINE DRUG SCREEN, HOSP PERFORMED
Amphetamines: NOT DETECTED
BARBITURATES: NOT DETECTED
Benzodiazepines: NOT DETECTED
Cocaine: NOT DETECTED
Opiates: NOT DETECTED
TETRAHYDROCANNABINOL: POSITIVE — AB

## 2016-07-06 LAB — CBC
HEMATOCRIT: 45.6 % (ref 36.0–46.0)
Hemoglobin: 16 g/dL — ABNORMAL HIGH (ref 12.0–15.0)
MCH: 35 pg — ABNORMAL HIGH (ref 26.0–34.0)
MCHC: 35.1 g/dL (ref 30.0–36.0)
MCV: 99.8 fL (ref 78.0–100.0)
Platelets: 224 10*3/uL (ref 150–400)
RBC: 4.57 MIL/uL (ref 3.87–5.11)
RDW: 13 % (ref 11.5–15.5)
WBC: 10 10*3/uL (ref 4.0–10.5)

## 2016-07-06 LAB — COMPREHENSIVE METABOLIC PANEL
ALBUMIN: 4.5 g/dL (ref 3.5–5.0)
ALK PHOS: 56 U/L (ref 38–126)
ALT: 12 U/L — AB (ref 14–54)
ANION GAP: 7 (ref 5–15)
AST: 24 U/L (ref 15–41)
BILIRUBIN TOTAL: 1.5 mg/dL — AB (ref 0.3–1.2)
BUN: 16 mg/dL (ref 6–20)
CALCIUM: 9.3 mg/dL (ref 8.9–10.3)
CO2: 24 mmol/L (ref 22–32)
CREATININE: 0.58 mg/dL (ref 0.44–1.00)
Chloride: 108 mmol/L (ref 101–111)
GFR calc Af Amer: >60 mL/min (ref >60)
GFR calc non Af Amer: >60 mL/min (ref >60)
GLUCOSE: 92 mg/dL (ref 65–99)
Potassium: 4.3 mmol/L (ref 3.5–5.1)
Sodium: 139 mmol/L (ref 135–145)
TOTAL PROTEIN: 7.6 g/dL (ref 6.5–8.1)

## 2016-07-06 LAB — SALICYLATE LEVEL: Salicylate Lvl: <7 mg/dL (ref 2.8–30.0)

## 2016-07-06 LAB — ETHANOL: Alcohol, Ethyl (B): <5 mg/dL (ref <5)

## 2016-07-06 LAB — ACETAMINOPHEN LEVEL: Acetaminophen (Tylenol), Serum: <10 ug/mL — ABNORMAL LOW (ref 10–30)

## 2016-07-06 MED ORDER — IBUPROFEN 200 MG PO TABS
600.0000 mg | ORAL_TABLET | Freq: Three times a day (TID) | ORAL | Status: DC | PRN
  Administered 2016-07-06 – 2016-07-07 (×2): 600 mg via ORAL
  Filled 2016-07-06 (×2): qty 3

## 2016-07-06 MED ORDER — MOMETASONE FURO-FORMOTEROL FUM 100-5 MCG/ACT IN AERO
2.0000 | INHALATION_SPRAY | Freq: Two times a day (BID) | RESPIRATORY_TRACT | Status: DC
  Filled 2016-07-06: qty 8.8

## 2016-07-06 MED ORDER — ALBUTEROL SULFATE HFA 108 (90 BASE) MCG/ACT IN AERS
2.0000 | INHALATION_SPRAY | Freq: Four times a day (QID) | RESPIRATORY_TRACT | Status: DC | PRN
  Administered 2016-07-07: 2 via RESPIRATORY_TRACT
  Filled 2016-07-06: qty 6.7

## 2016-07-06 MED ORDER — NORETHINDRONE 0.35 MG PO TABS: 1.0000 | ORAL_TABLET | Freq: Every day | ORAL | Status: DC

## 2016-07-06 MED ORDER — NICOTINE 21 MG/24HR TD PT24
21.0000 mg | MEDICATED_PATCH | Freq: Every day | TRANSDERMAL | Status: DC
  Administered 2016-07-06 – 2016-07-07 (×2): 21 mg via TRANSDERMAL
  Filled 2016-07-06 (×2): qty 1

## 2016-07-06 MED ORDER — PANTOPRAZOLE SODIUM 40 MG PO TBEC
40.0000 mg | DELAYED_RELEASE_TABLET | Freq: Every day | ORAL | Status: DC
  Administered 2016-07-06 – 2016-07-07 (×2): 40 mg via ORAL
  Filled 2016-07-06 (×2): qty 1

## 2016-07-06 NOTE — ED Notes (Signed)
Social worker at bedside.

## 2016-07-06 NOTE — ED Notes (Addendum)
SBAR Report received from previous nurse . Pt received calm and visible on unit. Pt denies current SI/ HI, A/V H, depression, anxiety, or pain at this time, and appears otherwise stable and free of distress. Pt reminded of camera surveillance, q 15 min rounds, and rules of the milieu. Pt tearful and gave short answers during assessment. Will continue to assess.
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
## 2016-07-06 NOTE — ED Notes (Signed)
ED Provider at bedside. 
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
## 2016-07-06 NOTE — ED Notes (Signed)
Bed: [id number] Expected date:  Expected time:  Means of arrival:  Comments: Hold for Balan

## 2016-07-06 NOTE — Progress Notes (Signed)
CSW [place] Dominga FerryCarla Bostick at ph: [phone].  CSW informed Miss Lynnae PrudeBostick he could neither confirm nor deny [facility].  CSW obtained permission form the pt to speak to [facility] and for Miss Bostick to speak to the pt.  CSW will place pt's ROI with Miss Bostick's name on pt's chart.  Miss Bostick informed CSW [place] on Wednesday March 21st, 2018 at [facility]9am, if the pt was released at that time.  Miss Lynnae PrudeBostick did not say what the purpose of the court date was for.  Miss Lynnae PrudeBostick also informed the CSW that Miss Lynnae PrudeBostick had gained custody of the pt's son Joeseph AmorKayden Sharp.  Miss Lynnae PrudeBostick stated she would return to her office on Monday March 19th, 2018 at [facility]9am . CSW informed the pt of the above information.  Pt requested CSW ask Miss Lynnae PrudeBostick the purpose of the court date, CSW called Miss Bostick, but did not reach her.  CSW left VM.  CSW informed Miss Lynnae PrudeBostick pt would return to her office on Monday March 19th, 2018 at [facility]9am.  CSW provided active listening and validated the pt's concerns.    Dorothe PeaJonathan F. Shenay Torti, Theresia MajorsLCSWA, LCAS Clinical Social Worker Ph: [phone]
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
## 2016-07-07 ENCOUNTER — Encounter (HOSPITAL_COMMUNITY): Payer: Self-pay | Admitting: Registered Nurse

## 2016-07-07 DIAGNOSIS — R45851 Suicidal ideations: Secondary | ICD-10-CM | POA: Insufficient documentation

## 2016-07-07 DIAGNOSIS — F4329 Adjustment disorder with other symptoms: Secondary | ICD-10-CM

## 2016-07-07 DIAGNOSIS — F17210 Nicotine dependence, cigarettes, uncomplicated: Secondary | ICD-10-CM

## 2016-07-07 DIAGNOSIS — Z79899 Other long term (current) drug therapy: Secondary | ICD-10-CM | POA: Diagnosis not present

## 2016-07-07 MED ORDER — ACETAMINOPHEN 325 MG PO TABS
650.0000 mg | ORAL_TABLET | Freq: Once | ORAL | Status: AC
  Administered 2016-07-07: 650 mg via ORAL
  Filled 2016-07-07: qty 2

## 2016-07-07 NOTE — BHH Suicide Risk Assessment (Cosign Needed)
Suicide Risk Assessment  Discharge Assessment   [facility]   Principal Problem: Adjustment disorder with disturbance of emotion Discharge Diagnoses:  Patient Active Problem List   Diagnosis Date Noted  . Dental caries [K02.9] 11/16/2014  . Bilateral low back pain without sciatica [M54.5] 09/24/2014  . Contraception management [Z30.9] 07/29/2014  . Acute bronchitis [J20.9] 03/30/2013  . History of thyroid disease [Z86.39] 02/24/2013  . ASCUS on Pap smear [R89.6] 02/01/2013  . Gastroesophageal reflux [K21.9] 12/12/2010  . Asthma, chronic [J45.909] 12/12/2010  . Adjustment disorder with disturbance of emotion [F43.29] 02/28/2010  . TOBACCO USER [F17.200] 04/25/2009    Total Time spent with patient: 30 minutes Musculoskeletal: Strength & Muscle Tone: within normal limits Gait & Station: normal Patient leans: N/A  Psychiatric Specialty Exam: Physical Exam  Nursing note and vitals reviewed.   Review of Systems  Psychiatric/Behavioral: Positive for depression (stable), hallucinations and substance abuse. Negative for memory loss and suicidal ideas. The patient is nervous/anxious. The patient does not have insomnia.   All other systems reviewed and are negative.   Blood pressure 107/69, pulse 89, temperature 98.1 F (36.7 C), temperature source Oral, resp. rate 17, SpO2 99 %, unknown if currently breastfeeding.There is no height or weight on file to calculate BMI.  General Appearance: Fairly Groomed  Eye Contact:  Good  Speech:  Clear and Coherent and Normal Rate  Volume:  Normal  Mood:  "Good"  Affect:  Congruent  Thought Process:  Coherent and Goal Directed  Orientation:  Full (Time, Place, and Person)  Thought Content:  Logical  Suicidal Thoughts:  No  Homicidal Thoughts:  No  Memory:  Immediate;   Good Recent;   Good Remote;   Good  Judgement:  Intact  Insight:  Fair  Psychomotor Activity:  Normal  Concentration:  Concentration: Good and  Attention Span: Good  Recall:  Good  Fund of Knowledge:  Fair  Language:  Good  Akathisia:  No  Handed:  Right  AIMS (if indicated):     Assets:  Communication Skills Desire for Improvement Housing Social Support  ADL's:  Intact  Cognition:  WNL  Sleep:      Mental Status Per Nursing Assessment::   On Admission:     Demographic Factors:  Caucasian  Loss Factors: Legal issues and Financial problems/change in socioeconomic status  Historical Factors: Impulsivity  Risk Reduction Factors:   Responsible for children under 31 years of age, Sense of responsibility to family and Living with another person, especially a relative  Continued Clinical Symptoms:  Alcohol/Substance Abuse/Dependencies Previous Psychiatric Diagnoses and Treatments  Cognitive Features That Contribute To Risk:  None    Suicide Risk:  Minimal: No identifiable suicidal ideation.  Patients presenting with no risk factors but with morbid ruminations; may be classified as minimal risk based on the severity of the depressive symptoms    Plan Of Care/Follow-up recommendations:  Activity:  As tolerated Diet:  As tolerated  Hermen Mario, NP 07/07/2016, 1:39 PM

## 2016-07-07 NOTE — Progress Notes (Signed)
CSW filed patient's examination and recommendation paperwork into IVC logbook.  Malakai Schoenherr, LCSWA [facility]  Clinical Social Worker [phone] 

## 2016-07-07 NOTE — ED Notes (Addendum)
Pt c/o intermittent chest pain , VS WDL. NO s/s of acute distress, pt reports it has occurred over the past week. This nurse notified EDP and he is not concerned at this time.

## 2016-07-07 NOTE — ED Notes (Signed)
Hourly rounding reveals patient sleeping in room. No complaints, stable, in no acute distress. Q15 minute rounds and monitoring via Security Cameras to continue. 

## 2016-07-07 NOTE — ED Notes (Signed)
Pt d/c home per MD order. Discharge summary reviewed with pt. Pt verbalizes understanding. Pt denies SI/HI/AVH. Pt denies physical pain. Pt signed for personal property and valuables locked in security, all personal property and valuables returned to pt. Pt signed e-signature. Ambulatory off unit with this nurse.

## 2016-07-07 NOTE — ED Notes (Signed)
Pt in dayroom, socializing with peer, eating a snack, voicing no complaints at this time. Will continue to monitor.

## 2016-07-07 NOTE — Discharge Instructions (Signed)
For your ongoing mental health needs, you are advised to follow up with [facility]. Hours of operation Monday - Friday 8:00am - 5:00pm.   [facility] [address] [place], [place] [address] [phone]

## 2016-07-07 NOTE — ED Notes (Signed)
Pt reports feeling anxious d/t being discharged and her children. Encouragement and support provided. No s/s of distress noted. Pt reports pain is better. Will continue to monitor.
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
## 2016-07-26 ENCOUNTER — Other Ambulatory Visit: Payer: Self-pay | Admitting: Student in an Organized Health Care Education/Training Program

## 2016-07-26 ENCOUNTER — Telehealth: Payer: Self-pay | Admitting: Student in an Organized Health Care Education/Training Program

## 2016-07-26 DIAGNOSIS — Z87448 Personal history of other diseases of urinary system: Secondary | ICD-10-CM

## 2016-07-26 NOTE — Telephone Encounter (Signed)
Pt says Ann Brown [facility] called her. After reviewing her records, they deterimined she needs to see at urllogist instead. Please advise

## 2016-07-26 NOTE — Telephone Encounter (Signed)
Please call and inform patient that a referral was made to Urology. She will receive a call to schedule an appointment. If she does not receive a call within two weeks please call our office so we can place the consult again. Thanks, LF

## 2016-07-27 NOTE — Telephone Encounter (Signed)
LVM for pt to call the office. If she calls, please give her the information below. Addalyn Speedy T Allison Silva, CMA  

## 2016-07-31 ENCOUNTER — Ambulatory Visit (INDEPENDENT_AMBULATORY_CARE_PROVIDER_SITE_OTHER): Payer: Medicaid Other

## 2016-07-31 ENCOUNTER — Ambulatory Visit (INDEPENDENT_AMBULATORY_CARE_PROVIDER_SITE_OTHER): Payer: Medicaid Other | Admitting: Orthopaedic Surgery

## 2016-07-31 ENCOUNTER — Encounter (INDEPENDENT_AMBULATORY_CARE_PROVIDER_SITE_OTHER): Payer: Self-pay | Admitting: Orthopaedic Surgery

## 2016-07-31 DIAGNOSIS — M25531 Pain in right wrist: Secondary | ICD-10-CM

## 2016-07-31 DIAGNOSIS — M545 Low back pain: Secondary | ICD-10-CM

## 2016-07-31 DIAGNOSIS — M25511 Pain in right shoulder: Secondary | ICD-10-CM

## 2016-07-31 DIAGNOSIS — G8929 Other chronic pain: Secondary | ICD-10-CM | POA: Diagnosis not present

## 2016-07-31 MED ORDER — CYCLOBENZAPRINE HCL 5 MG PO TABS: 5.0000 mg | ORAL_TABLET | Freq: Three times a day (TID) | ORAL | 3 refills | Status: DC | PRN

## 2016-07-31 NOTE — Progress Notes (Signed)
Office Visit Note   Patient: Ann Brown           Date of Birth: 05/20/1985           MRN: [id number] Visit Date: 07/31/2016              Requested by: Howard Pouch, MD [address] [place], [place] [address] PCP: Howard Pouch, MD   Assessment & Plan: Visit Diagnoses:  1. Pain in right wrist   2. Chronic bilateral low back pain, with sciatica presence unspecified   3. Chronic right shoulder pain     Plan: Her presentations all very nonspecific. I cannot pinpoint any focal findings. 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 I did give her prescription for Flexeril and physical therapy with modalities. Hopefully this will help. From my standpoint I do not see any orthopedic surgical issues that needs to be addressed. Follow-up with me as needed.Total face to face encounter time was greater than 45 minutes and over half of this time was spent in counseling and/or coordination of care.  Follow-Up Instructions: Return if symptoms worsen or fail to improve.   Orders:  Orders Placed This Encounter  Procedures  . XR Wrist Complete Right  . XR Lumbar Spine 2-3 Views  . XR Shoulder Right   Meds ordered this encounter  Medications  . cyclobenzaprine (FLEXERIL) 5 MG tablet    Sig: Take 1-2 tablets (5-10 mg total) by mouth 3 (three) times daily as needed for muscle spasms.    Dispense:  30 tablet    Refill:  3      Procedures: No procedures performed   Clinical Data: No additional findings.   Subjective: Chief Complaint  Patient presents with  . Right Wrist - Pain  . Lower Back - Pain  . Right Shoulder - Pain    Patient comes in today with multiple complaints of right shoulder pain, low back pain, right wrist pain. Internal the shoulder she endorses pain in the trapezial region that radiates down into the scapula. She denies any radicular symptoms. She has had trigger point injections with partial relief.  She is not had any  physical therapy. She is not taking any muscle relaxers. Pain is worse with use of the arm and shoulder. In terms of her low back she has discomfort more in the mid back region directly centrally worse at the end of the day with flexion of the back. She denies any radicular symptoms. Total wrist she endorses nonspecific discomfort in her right wrist on the volar side with activity.    Review of Systems  Constitutional: Negative.   HENT: Negative.   Eyes: Negative.   Respiratory: Negative.   Cardiovascular: Negative.   Endocrine: Negative.   Musculoskeletal: Negative.   Neurological: Negative.   Hematological: Negative.   Psychiatric/Behavioral: Negative.   All other systems reviewed and are negative.    Objective: Vital Signs: There were no vitals taken for this visit.  Physical Exam  Constitutional: She is oriented to person, place, and time. She appears well-developed and well-nourished.  HENT:  Head: Normocephalic and atraumatic.  Eyes: EOM are normal.  Neck: Neck supple.  Pulmonary/Chest: Effort normal.  Abdominal: Soft.  Neurological: She is alert and oriented to person, place, and time.  Skin: Skin is warm. Capillary refill takes less than 2 seconds.  Psychiatric: She has a normal mood and affect. Her behavior is normal. Judgment and  thought content normal.  Nursing note and vitals reviewed.   Ortho Exam Right shoulder exam is benign for rotator cuff pathology or intra-articular issues. She mainly has a trigger point in the trapezial region. Back exam shows no palpable deformities or step-offs. She has normal range of motion. No radicular symptoms. Right wrist exam shows no swelling or any worrisome features. There is no cellulitis or redness. Is not really any reproducible findings. Specialty Comments:  No specialty comments available.  Imaging: No results found.   PMFS History: Patient Active Problem List   Diagnosis Date Noted  . Suicidal ideation   .  Dental caries 11/16/2014  . Bilateral low back pain without sciatica 09/24/2014  . Contraception management 07/29/2014  . Acute bronchitis 03/30/2013  . History of thyroid disease 02/24/2013  . ASCUS on Pap smear 02/01/2013  . Gastroesophageal reflux 12/12/2010  . Asthma, chronic 12/12/2010  . Adjustment disorder with disturbance of emotion 02/28/2010  . TOBACCO USER 04/25/2009   Past Medical History:  Diagnosis Date  . Asthma   . Breast mass    fibroadenomas -   . GERD (gastroesophageal reflux disease)   . Kidney problem    Kideney reflux  . Tobacco use   . URI 03/01/2009    Family History  Problem Relation Age of Onset  . Multiple sclerosis Mother   . Cervical cancer Mother     Past Surgical History:  Procedure Laterality Date  . BOWEL RESECTION    . CHOLECYSTECTOMY    . Complex trunk closure  2005  . CSF SHUNT  1987  . TRACHEOSTOMY  1987   Social History   Occupational History  . Not on file.   Social History Main Topics  . Smoking status: Current Every Day Smoker    Packs/day: 1.00    Types: Cigarettes  . Smokeless tobacco: Never Used     Comment: Also uses ecigarette  . Alcohol use No  . Drug use: No  . Sexual activity: Yes    Birth control/ protection: None

## 2016-08-03 ENCOUNTER — Telehealth (INDEPENDENT_AMBULATORY_CARE_PROVIDER_SITE_OTHER): Payer: Self-pay | Admitting: *Deleted

## 2016-08-03 ENCOUNTER — Ambulatory Visit: Payer: Self-pay | Admitting: Family Medicine

## 2016-08-03 NOTE — Telephone Encounter (Signed)
Patient called in this afternoon in regards to wanting a new prescription if possible. The curent muscle relaxer that she is on flexaril is making her feel awful and unable to function she states. She would like to know if there was anything else that Dr. Roda Shutters could possibly prescribe her. Thank you Her CB # [phone].

## 2016-08-06 MED ORDER — TIZANIDINE HCL 2 MG PO CAPS: ORAL_CAPSULE | ORAL | 0 refills | Status: DC

## 2016-08-06 NOTE — Telephone Encounter (Signed)
Sent into pharm called pt LMOM to let her know

## 2016-08-06 NOTE — Telephone Encounter (Signed)
Zanaflex 2 mg. 1-2 tab po tid prn spasm pain. #30

## 2016-08-06 NOTE — Telephone Encounter (Signed)
Please advise 

## 2016-08-17 ENCOUNTER — Ambulatory Visit (HOSPITAL_COMMUNITY)
Admission: RE | Admit: 2016-08-17 | Discharge: 2016-08-17 | Disposition: A | Discharge status: Home / Self Care | Payer: Medicaid Other | Source: Ambulatory Visit | Attending: Family Medicine | Admitting: Family Medicine

## 2016-08-17 ENCOUNTER — Ambulatory Visit (INDEPENDENT_AMBULATORY_CARE_PROVIDER_SITE_OTHER): Payer: Medicaid Other | Admitting: Internal Medicine

## 2016-08-17 ENCOUNTER — Telehealth: Payer: Self-pay | Admitting: *Deleted

## 2016-08-17 VITALS — BP 120/70 | HR 93 | Temp 98.2°F | Ht 61.0 in | Wt 97.0 lb

## 2016-08-17 DIAGNOSIS — R079 Chest pain, unspecified: Secondary | ICD-10-CM | POA: Insufficient documentation

## 2016-08-17 DIAGNOSIS — J4530 Mild persistent asthma, uncomplicated: Secondary | ICD-10-CM | POA: Diagnosis not present

## 2016-08-17 MED ORDER — ALBUTEROL SULFATE HFA 108 (90 BASE) MCG/ACT IN AERS: 2.0000 | INHALATION_SPRAY | Freq: Four times a day (QID) | RESPIRATORY_TRACT | 3 refills | Status: DC | PRN

## 2016-08-17 MED ORDER — ESOMEPRAZOLE MAGNESIUM 40 MG PO CPDR: 40.0000 mg | DELAYED_RELEASE_CAPSULE | Freq: Every day | ORAL | 3 refills | Status: DC

## 2016-08-17 MED ORDER — FLUTICASONE-SALMETEROL 100-50 MCG/DOSE IN AEPB: 1.0000 | INHALATION_SPRAY | Freq: Two times a day (BID) | RESPIRATORY_TRACT | 5 refills | Status: DC

## 2016-08-17 NOTE — Assessment & Plan Note (Signed)
Atypical. Patient has been having right-sided chest pain for the last 2 months. The pain comes and goes and is not related to exertion. She feels like the pain is related to stress. Very low suspicion for cardiac etiology. - EKG obtained in clinic today with normal sinus rhythm and no signs of ischemia or infarct. - Refill provided for Nexium, as reflux may be playing a part - Follow up with PCP if this continues

## 2016-08-17 NOTE — Progress Notes (Signed)
   [facility] Phone: [phone]  Subjective:  Ann Brown is a 31 year old female presenting to same day clinic with chest pain and needing refills of her asthma medications.   Chest Pain: The chest pain has been going on since March. The pain is located in the right side of her chest. The pain "comes out of nowhere". Nothing makes the pain worse or better. The pain feels like a "stabbing pain". The pain lasts for about 30 minutes at a time and then resolves on its own. She does not feel like the chest pain is related to her asthma. She thinks that her chest pain is related to stress.  Asthma: Asthma has not been under good control recently. The asthma is triggered by pollen, which is worse this time year. She has been using her Advair twice a day. She has been using albuterol twice a day for shortness of breath. No cough, no nighttime awakenings.  ROS: See HPI for pertinent positives and negatives  Past Medical History- asthma, GERD, adjustment disorder  Family history- no family history of heart disease  Social history- patient is a current smoker  Objective: BP 120/70 Comment: left  Pulse 93   Temp 98.2 F (36.8 C) (Oral)   Ht  (1.549 m)   Wt 97 lb (44 kg)   LMP 08/15/2016   SpO2 97%   BMI 18.33 kg/m  Gen: NAD, alert, cooperative with exam HEENT: EOMI, MMM CV: RRR, no murmur, no tenderness to palpation of the sternum Resp: CTABL, no wheezes, normal work of breathing Msk: No edema, warm, normal tone, moves UE/LE spontaneously Neuro: Alert and oriented, no gross deficits Skin: No rashes, no lesions Psych: Appropriate behavior  Assessment/Plan: Chest Pain: Atypical. Patient has been having right-sided chest pain for the last 2 months. The pain comes and goes and is not related to exertion. She feels like the pain is related to stress. Very low suspicion for cardiac etiology. - EKG obtained in clinic today with normal sinus rhythm and no signs of  ischemia or infarct. - Refill provided for Nexium, as reflux may be playing a part - Follow up with PCP if this continues  Asthma: Not well controlled due to to pollen as a trigger. Medications are helping. On exam, she is moving good air and does not have any wheezing. Not in any respiratory distress. - Refills provided for albuterol and Advair - Follow up with PCP  Willadean Carol, MD PGY-2

## 2016-08-17 NOTE — Telephone Encounter (Signed)
Received PA approval for Ventolin HFA via [place] [employer].  Med approved for 08/17/2016 - 08/17/17.  [facility] informed.  PA confirmation number [id number] Link Snuffer, Bronson Ing, RN

## 2016-08-17 NOTE — Patient Instructions (Signed)
It was so nice to meet you!  I have refilled your Nexium, Albuterol, and Advair. We did an EKG today, which was normal. I think your chest pains may be related to your stress or your reflux.  Please stop by the front desk and schedule an appointment with Dr. Mosetta Putt to discuss the ovarian cysts.  -Dr. Nancy Marus

## 2016-08-17 NOTE — Telephone Encounter (Addendum)
Prior Authorization received from [facility] for esomeprazole 40 mg, Ventolin HFA and Advair Diskus. Formulary and PA form placed in provider box for completion. Clovis Pu, RN

## 2016-08-17 NOTE — Telephone Encounter (Signed)
Prior authorization filled out and placed in Ann Brown's box.

## 2016-08-17 NOTE — Assessment & Plan Note (Signed)
Not well controlled due to to pollen as a trigger. Medications are helping. On exam, she is moving good air and does not have any wheezing. Not in any respiratory distress. - Refills provided for albuterol and Advair - Follow up with PCP

## 2016-08-20 NOTE — Telephone Encounter (Signed)
PA approved for esomeprazole 40 mg via [place] [employer] until 10/19/2016.  Approval number [id number].   PA for Advair Diskus 100-50 mcg via [place] [employer] until 08/20/2017.  Approval number [id number].  Clovis Pu, RN

## 2016-08-23 ENCOUNTER — Telehealth (INDEPENDENT_AMBULATORY_CARE_PROVIDER_SITE_OTHER): Payer: Self-pay | Admitting: Orthopaedic Surgery

## 2016-08-23 MED ORDER — BACLOFEN 10 MG PO TABS: 10.0000 mg | ORAL_TABLET | Freq: Three times a day (TID) | ORAL | 0 refills | Status: DC

## 2016-08-23 NOTE — Telephone Encounter (Signed)
Sent in to pharm.

## 2016-08-23 NOTE — Telephone Encounter (Signed)
Please advise. See message below.  

## 2016-08-23 NOTE — Telephone Encounter (Signed)
Patient called saying that the Zanaflex prescribed to her is making her really sick on her stomach. Patient was wondering if there was something else she could take? CB # [phone]

## 2016-08-23 NOTE — Addendum Note (Signed)
Addended by: Albertina ParrGARCIA, Eddi Hymes on: 08/23/2016 06:28 PM   Modules accepted: Orders

## 2016-08-23 NOTE — Telephone Encounter (Signed)
Baclofen 10 mg tid prn #30

## 2016-08-28 ENCOUNTER — Telehealth (INDEPENDENT_AMBULATORY_CARE_PROVIDER_SITE_OTHER): Payer: Self-pay | Admitting: Orthopaedic Surgery

## 2016-08-28 NOTE — Telephone Encounter (Signed)
Called pt to advise patient aware

## 2016-08-28 NOTE — Telephone Encounter (Signed)
Just the home exercises that they should have provided

## 2016-08-28 NOTE — Telephone Encounter (Signed)
Please advise. Thanks.  

## 2016-08-28 NOTE — Telephone Encounter (Signed)
Patient called saying that Dr. Roda ShuttersXu referred her to PT but her insurance will only cover 1 visit. She was wondering what her options were. CB # [phone]

## 2016-09-04 ENCOUNTER — Ambulatory Visit: Payer: Medicaid Other | Attending: Orthopaedic Surgery | Admitting: Physical Therapy

## 2016-09-06 ENCOUNTER — Telehealth (INDEPENDENT_AMBULATORY_CARE_PROVIDER_SITE_OTHER): Payer: Self-pay | Admitting: Orthopaedic Surgery

## 2016-09-06 NOTE — Telephone Encounter (Signed)
See message below °

## 2016-09-06 NOTE — Telephone Encounter (Signed)
Patient called needing Rx refilled (lioresal) The number to contact patient is [phone]

## 2016-09-06 NOTE — Telephone Encounter (Signed)
30

## 2016-09-07 MED ORDER — BACLOFEN 10 MG PO TABS: 10.0000 mg | ORAL_TABLET | Freq: Three times a day (TID) | ORAL | 0 refills | Status: DC

## 2016-09-07 NOTE — Addendum Note (Signed)
Addended by: Albertina ParrGARCIA, Alice Burnside on: 09/07/2016 11:47 AM   Modules accepted: Orders

## 2016-09-07 NOTE — Telephone Encounter (Signed)
SENT INTO PHARM 

## 2016-09-10 NOTE — Progress Notes (Signed)
   CC: Vaginal Discharge  HPI: Ann Brown is a 31 y.o. female with presenting for vaginal discharge. She does endorse recent unprotected intercourse and last menstrual cycle was one month ago.  Vaginal Discharge Onset: 1 week   Description: white, thick  Odor: no   Itching: yes  Symptoms Dysuria: no   Bleeding: no  Pelvic pain: no  Back pain: no  Fever: no  Genital sores: no  Rash: no  Dyspareunia: no  GI Sxs: no  Prior treatment: yes - antibiotic for dental procedure (nerve exposed)  Red Flags: Missed period: no  Pregnancy: yes  Recent antibiotics: yes  Sexual activity: yes -  Possible STD exposure: no  IUD: no  Diabetes: no   Bilateral inguinal enlarged lymph nodes - chronic for multiple weeks - nontender - no redness or irritation - seen by physician in the past and measured, she calls them "cysts" - no recent fevers - no enlarged lymph nodes elsewhere on body  Review of Symptoms:  See HPI for ROS.   CC, SH/smoking status, and VS noted.  Objective: BP 102/60   Pulse 74   Temp 98.4 F (36.9 C) (Oral)   Ht 5\' 1"  (1.549 m)   Wt 101 lb (45.8 kg)   LMP 08/15/2016 (Approximate)   SpO2 99%   BMI 19.08 kg/m  GEN: NAD, alert, cooperative, and pleasant. LYMPH: 0.5 cm inguinal lymph node palpated on right, 1.0 cm inguinal lymph node palpated on left. No other palpable lymph nodes noted in axilla, cervical regions GYN:  External genitalia within normal limits.  Vaginal mucosa pink, moist, normal rugae.  Nonfriable cervix without lesions, +white milky discharge but no bleeding noted on speculum exam.  Bimanual exam revealed normal, nongravid uterus.  No cervical motion tenderness. No adnexal masses bilaterally.   NEURO: II-XII grossly intact, normal gait, peripheral sensation intact PSYCH: AAOx3, appropriate affect  Wet prep:  - many bacteria - negative whiff - moderate yeast - no clue cells - no trichamonas  bHcg - negative  Assessment and  plan:  Vaginal Yeast Infection Symptoms, exam and Wet prep consistent w/vaginal yeast infection - bhcg negative - check for STI's given recent uprotected intercourse: RPR, HIV, wet prep (completed), GC/Chlamydia - will call her with STI results - Diflucan 150 mg x1, can take the other pill if symptoms have not resolved in 3 days  Localized enlarged lymph nodes Reactive lymphadenopathy, Likely worsened in setting of recent yeast infection.   - no red flags. nodes are easy to move, small - no redness, no irritation - monitor   Orders Placed This Encounter  Procedures  . RPR  . HIV antibody  . POCT urine pregnancy  . POCT Wet Prep [facility]    Medications ordered this encounter . fluconazole (DIFLUCAN) 150 MG tablet    Sig: Take 1 tablet (150 mg total) by mouth once.    Dispense:  2 tablet    Refill:  0    Howard PouchLauren Asuka Dusseau, MD,MS,  PGY1 09/15/2016 3:16 PM

## 2016-09-11 ENCOUNTER — Other Ambulatory Visit: Payer: Self-pay | Admitting: Student in an Organized Health Care Education/Training Program

## 2016-09-11 NOTE — Telephone Encounter (Signed)
LVM - would like to speak with patient before ordering fluconazole. Will try to call again tomorrow.

## 2016-09-11 NOTE — Telephone Encounter (Signed)
Pt recently finished antibiotics and now has a yeast infection. [facility] [address] ep

## 2016-09-12 NOTE — Telephone Encounter (Signed)
Called x2, LVM. Unable to get through to patient regarding fluconazole for her yeast infection. I would like to speak with her before sending a script.  I noticed she is on my schedule for Friday. LVM she can call our office back if possible so that I can get in touch with her, otherwise I will see her in the office on Friday.

## 2016-09-14 ENCOUNTER — Other Ambulatory Visit (HOSPITAL_COMMUNITY)
Admission: RE | Admit: 2016-09-14 | Discharge: 2016-09-14 | Disposition: A | Discharge status: Home / Self Care | Payer: Medicaid Other | Source: Ambulatory Visit | Attending: Family Medicine | Admitting: Family Medicine

## 2016-09-14 ENCOUNTER — Telehealth (INDEPENDENT_AMBULATORY_CARE_PROVIDER_SITE_OTHER): Payer: Self-pay | Admitting: Orthopaedic Surgery

## 2016-09-14 ENCOUNTER — Encounter: Payer: Self-pay | Admitting: Student in an Organized Health Care Education/Training Program

## 2016-09-14 ENCOUNTER — Ambulatory Visit (INDEPENDENT_AMBULATORY_CARE_PROVIDER_SITE_OTHER): Payer: Medicaid Other | Admitting: Student in an Organized Health Care Education/Training Program

## 2016-09-14 VITALS — BP 102/60 | HR 74 | Temp 98.4°F | Ht 61.0 in | Wt 101.0 lb

## 2016-09-14 DIAGNOSIS — N898 Other specified noninflammatory disorders of vagina: Secondary | ICD-10-CM

## 2016-09-14 DIAGNOSIS — R590 Localized enlarged lymph nodes: Secondary | ICD-10-CM | POA: Diagnosis not present

## 2016-09-14 LAB — POCT WET PREP (WET MOUNT)
CLUE CELLS WET PREP WHIFF POC: NEGATIVE
Trichomonas Wet Prep HPF POC: ABSENT
WBC, Wet Prep HPF POC: >20

## 2016-09-14 LAB — POCT URINE PREGNANCY: Preg Test, Ur: NEGATIVE

## 2016-09-14 MED ORDER — FLUCONAZOLE 150 MG PO TABS: 150.0000 mg | ORAL_TABLET | Freq: Once | ORAL | 0 refills | Status: AC

## 2016-09-14 MED ORDER — FLUCONAZOLE 150 MG PO TABS: 150.0000 mg | ORAL_TABLET | Freq: Once | ORAL | 0 refills | Status: DC

## 2016-09-14 NOTE — Patient Instructions (Addendum)
It was a pleasure seeing you today in our clinic. Today we discussed your lymph nodes and your yeast infection. Here is the treatment plan we have discussed and agreed upon together:  Your lymph nodes are normal. If they were to become much bigger we would want you to come in again to be seen.  I will treat your yeast infection with diflucan. You can take one pill. If your symptoms do not resolve in 3 days, take the other pill.  I will call you with the results of your other tests. If you do not hear from me in one week please call our office.  Our clinic's number is [phone]. Please call with questions or concerns about what we discussed today.  Be well, Dr. Mosetta PuttFeng

## 2016-09-14 NOTE — Telephone Encounter (Signed)
patient called, is having increased pain in neck and back and shoulders. Baclofin not helping. Has also been taking Tylenol but not helping either. Please call to advise. Ph# [phone] . (Has follow up appt 6/4)patient is aware Dr. Roda ShuttersXu out of office until Tuesday

## 2016-09-15 DIAGNOSIS — R590 Localized enlarged lymph nodes: Secondary | ICD-10-CM | POA: Insufficient documentation

## 2016-09-15 DIAGNOSIS — N898 Other specified noninflammatory disorders of vagina: Secondary | ICD-10-CM | POA: Insufficient documentation

## 2016-09-15 LAB — RPR: RPR: NONREACTIVE

## 2016-09-15 LAB — HIV ANTIBODY (ROUTINE TESTING W REFLEX): HIV Screen 4th Generation wRfx: NONREACTIVE

## 2016-09-15 NOTE — Assessment & Plan Note (Signed)
Reactive lymphadenopathy, Likely worsened in setting of recent yeast infection.   - no red flags. nodes are easy to move, small - no redness, no irritation - monitor

## 2016-09-15 NOTE — Assessment & Plan Note (Signed)
Symptoms, exam and Wet prep consistent w/vaginal yeast infection - bhcg negative - check for STI's given recent uprotected intercourse: RPR, HIV, wet prep (completed), GC/Chlamydia - will call her with STI results - Diflucan 150 mg x1, can take the other pill if symptoms have not resolved in 3 days

## 2016-09-18 LAB — GC/CHLAMYDIA PROBE AMP (~~LOC~~) NOT AT ARMC
Chlamydia: NEGATIVE
Neisseria Gonorrhea: NEGATIVE

## 2016-09-18 MED ORDER — TIZANIDINE HCL 2 MG PO CAPS: ORAL_CAPSULE | ORAL | 0 refills | Status: DC

## 2016-09-18 NOTE — Telephone Encounter (Signed)
See message below °

## 2016-09-18 NOTE — Telephone Encounter (Signed)
Sent in to pharm.

## 2016-09-18 NOTE — Telephone Encounter (Signed)
Tizanidine 2 mg tid prn #30

## 2016-09-19 ENCOUNTER — Encounter: Payer: Self-pay | Admitting: Student in an Organized Health Care Education/Training Program

## 2016-09-19 ENCOUNTER — Telehealth (INDEPENDENT_AMBULATORY_CARE_PROVIDER_SITE_OTHER): Payer: Self-pay | Admitting: Orthopaedic Surgery

## 2016-09-19 DIAGNOSIS — M25531 Pain in right wrist: Secondary | ICD-10-CM

## 2016-09-19 NOTE — Telephone Encounter (Signed)
Her pain is more than I am comfortable managing.  We need to refer her to pain management clinic

## 2016-09-19 NOTE — Telephone Encounter (Signed)
Patient states she can not take Zanaflex, and request something different. [facility] [address]

## 2016-09-19 NOTE — Progress Notes (Signed)
LVM that I will send a letter with results of her recent lab tests.

## 2016-09-19 NOTE — Telephone Encounter (Signed)
Called pt no answer, LMOM with details

## 2016-09-19 NOTE — Telephone Encounter (Signed)
Please advise 

## 2016-09-24 ENCOUNTER — Ambulatory Visit (INDEPENDENT_AMBULATORY_CARE_PROVIDER_SITE_OTHER): Payer: Self-pay | Admitting: Orthopaedic Surgery

## 2016-09-26 ENCOUNTER — Telehealth (INDEPENDENT_AMBULATORY_CARE_PROVIDER_SITE_OTHER): Payer: Self-pay | Admitting: Radiology

## 2016-09-26 MED ORDER — BACLOFEN 10 MG PO TABS: 10.0000 mg | ORAL_TABLET | Freq: Three times a day (TID) | ORAL | 3 refills | Status: DC

## 2016-09-26 NOTE — Telephone Encounter (Signed)
Rx sent in to pharm 

## 2016-09-26 NOTE — Addendum Note (Signed)
Addended by: Albertina ParrGARCIA, Hetty Linhart on: 09/26/2016 11:28 AM   Modules accepted: Orders

## 2016-09-26 NOTE — Telephone Encounter (Signed)
See message below °

## 2016-09-26 NOTE — Telephone Encounter (Signed)
That's fine.  #30 of baclofen with 3 refills.

## 2016-09-26 NOTE — Telephone Encounter (Signed)
Patient calling today, requesting refill on baclofen. She is having leg and feet cramps. She is pending pain management referral but is wanting something to get her by in the meantime. Please call back to advise at [phone]. [facility] [address]

## 2016-10-07 ENCOUNTER — Emergency Department (HOSPITAL_COMMUNITY)
Admission: EM | Admit: 2016-10-07 | Discharge: 2016-10-07 | Disposition: A | Discharge status: Home / Self Care | Payer: Medicaid Other | Attending: Emergency Medicine | Admitting: Emergency Medicine

## 2016-10-07 ENCOUNTER — Emergency Department (HOSPITAL_COMMUNITY): Payer: Medicaid Other

## 2016-10-07 ENCOUNTER — Encounter (HOSPITAL_COMMUNITY): Payer: Self-pay | Admitting: Emergency Medicine

## 2016-10-07 DIAGNOSIS — Z79899 Other long term (current) drug therapy: Secondary | ICD-10-CM | POA: Diagnosis not present

## 2016-10-07 DIAGNOSIS — Y939 Activity, unspecified: Secondary | ICD-10-CM | POA: Insufficient documentation

## 2016-10-07 DIAGNOSIS — F17210 Nicotine dependence, cigarettes, uncomplicated: Secondary | ICD-10-CM | POA: Diagnosis not present

## 2016-10-07 DIAGNOSIS — J45909 Unspecified asthma, uncomplicated: Secondary | ICD-10-CM | POA: Diagnosis not present

## 2016-10-07 DIAGNOSIS — S161XXA Strain of muscle, fascia and tendon at neck level, initial encounter: Secondary | ICD-10-CM

## 2016-10-07 DIAGNOSIS — Y92410 Unspecified street and highway as the place of occurrence of the external cause: Secondary | ICD-10-CM | POA: Diagnosis not present

## 2016-10-07 DIAGNOSIS — S199XXA Unspecified injury of neck, initial encounter: Secondary | ICD-10-CM | POA: Diagnosis present

## 2016-10-07 DIAGNOSIS — Y999 Unspecified external cause status: Secondary | ICD-10-CM | POA: Diagnosis not present

## 2016-10-07 IMAGING — CT CT CERVICAL SPINE W/O CM
4 series · 16 of 33 positions shown, 19 images · non-contrast
Comparison: None.

CLINICAL DATA: Pain after trauma

EXAM:
CT CERVICAL SPINE WITHOUT CONTRAST
TECHNIQUE: Multidetector CT imaging of the cervical spine was performed without
intravenous contrast. Multiplanar CT image reconstructions were also
generated.

[Series 4: c spine soft · axial · 0.23mm/px · z∈[-334,-274]mm · 3 of 91 slices shown]
[im 16/91  soft-tissue]
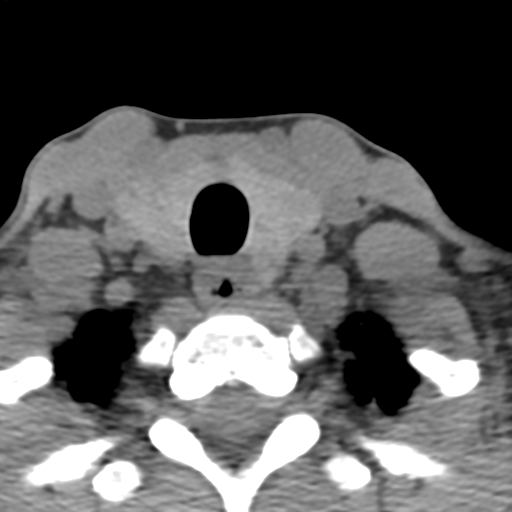
[im 31/91  soft-tissue]
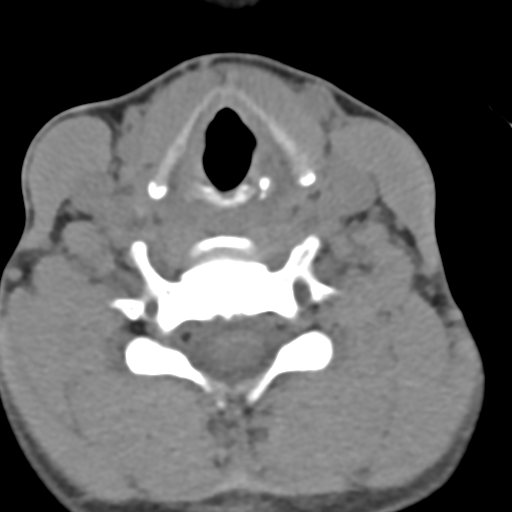
[im 46/91  soft-tissue]
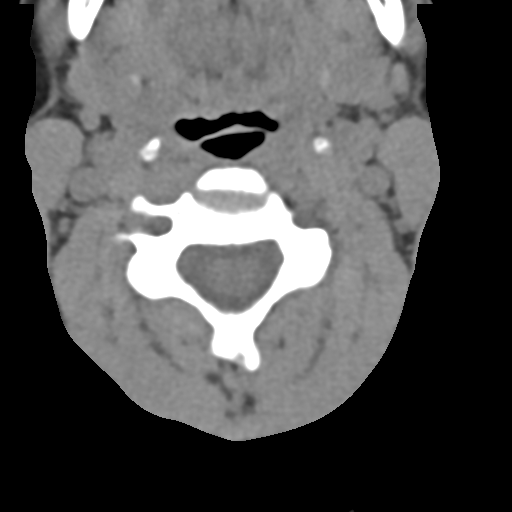

[Series 602: axial reformats · axial · 0.35mm/px · z∈[-377,-269]mm · 5 of 97 slices shown, 7 images]
[im 17/97  soft-tissue]
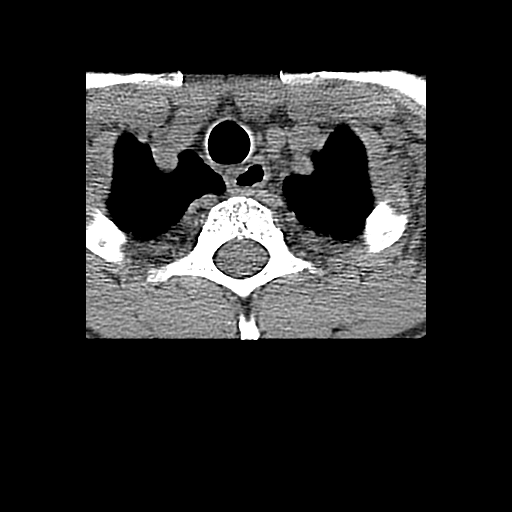
[im 17/97  bone]
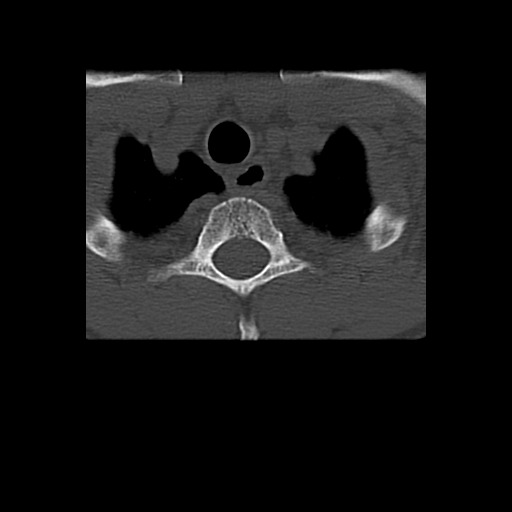
[im 33/97  bone]
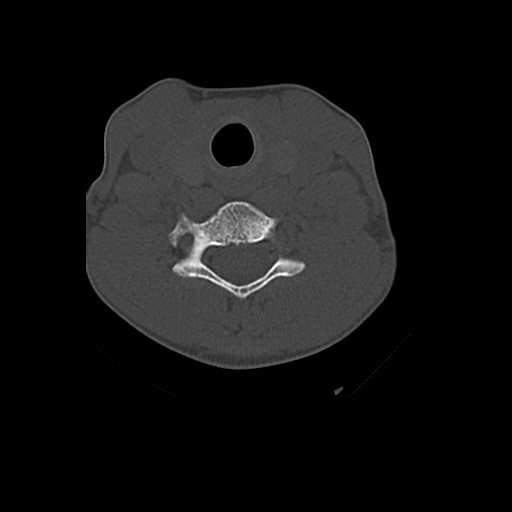
[im 49/97  bone]
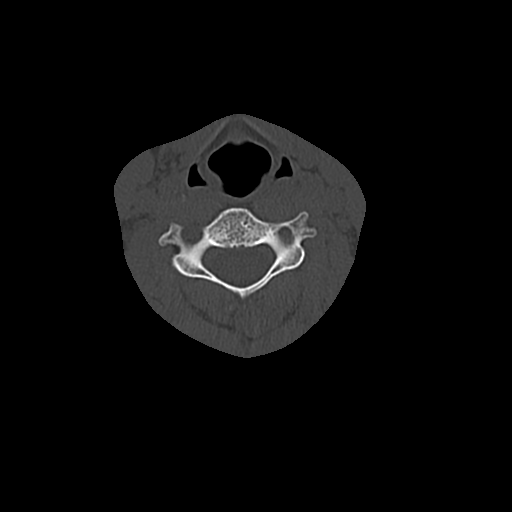
[im 65/97  bone]
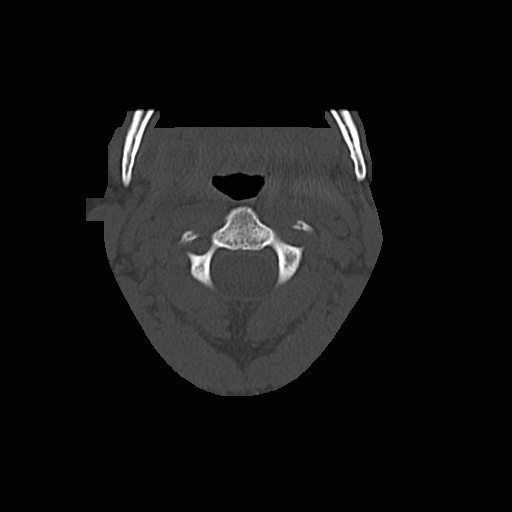
[im 81/97  soft-tissue]
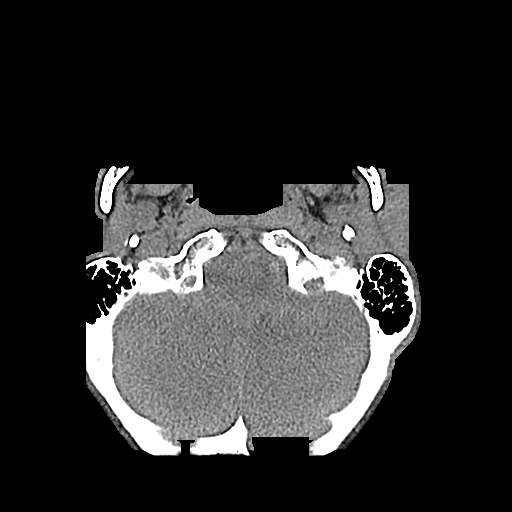
[im 81/97  bone]
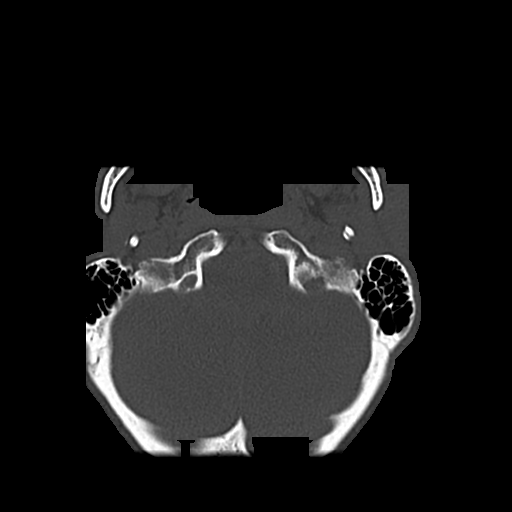

[Series 603: coronal images · coronal · 0.35mm/px · 3 of 35 slices shown]
[im 7/35  bone]
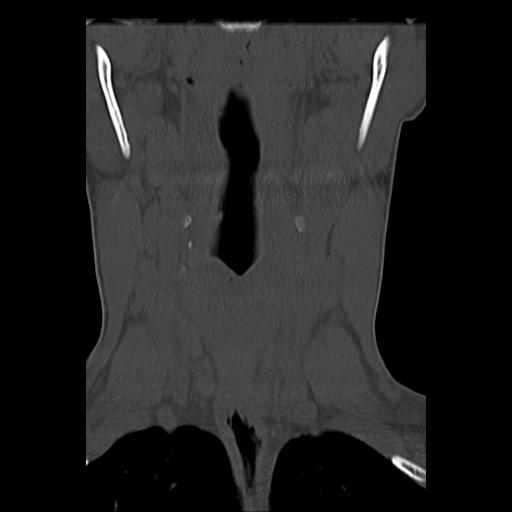
[im 14/35  bone]
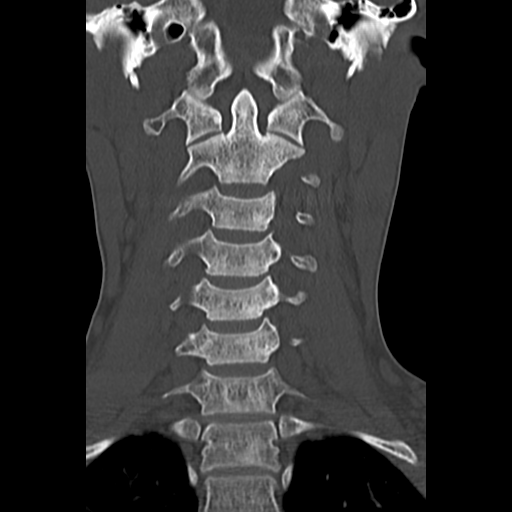
[im 21/35  bone]
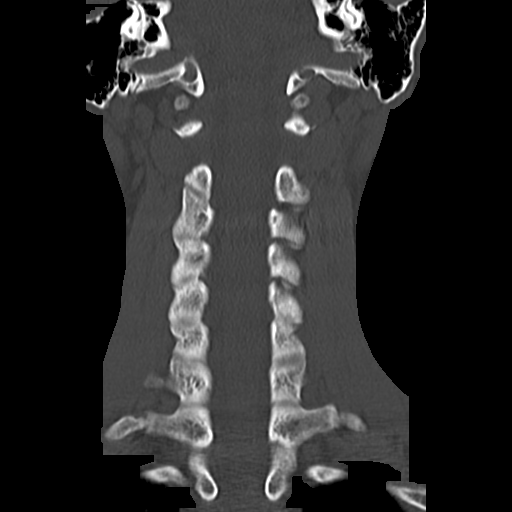

[Series 604: sagittal images · sagittal · 0.35mm/px · 5 of 33 slices shown, 6 images]
[im 11/33  bone]
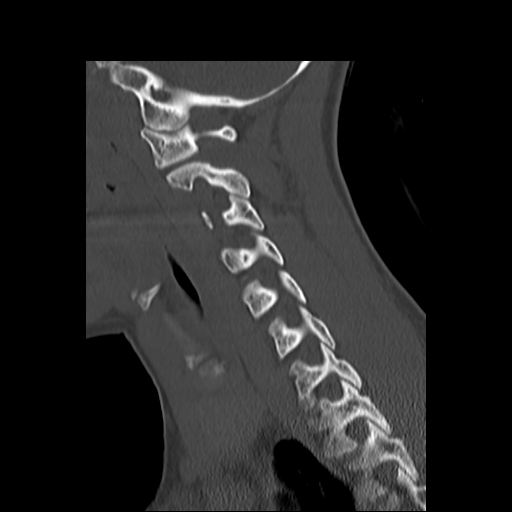
[im 14/33  bone]
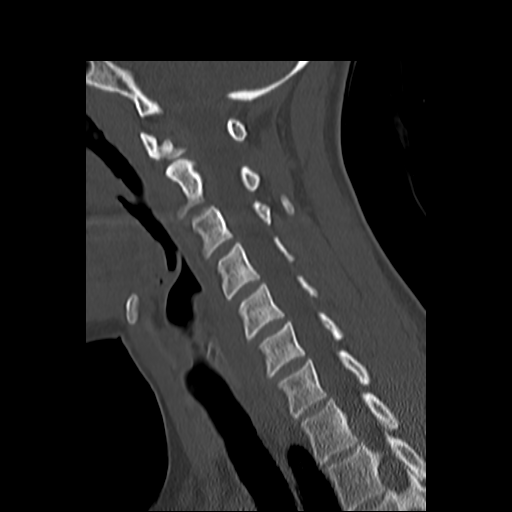
[im 17/33  soft-tissue]
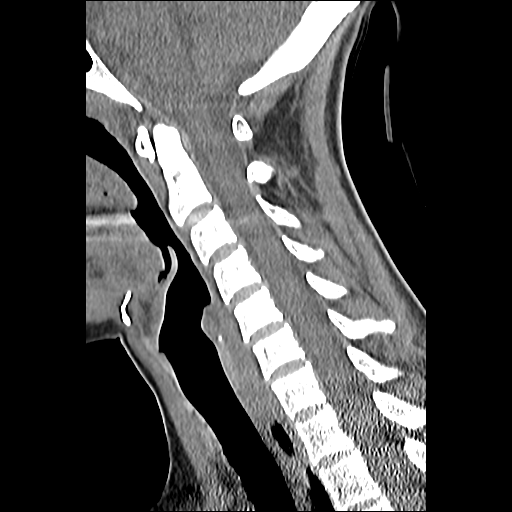
[im 17/33  bone]
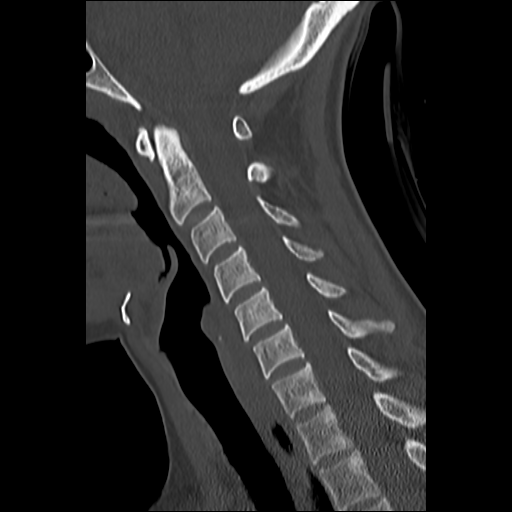
[im 19/33  bone]
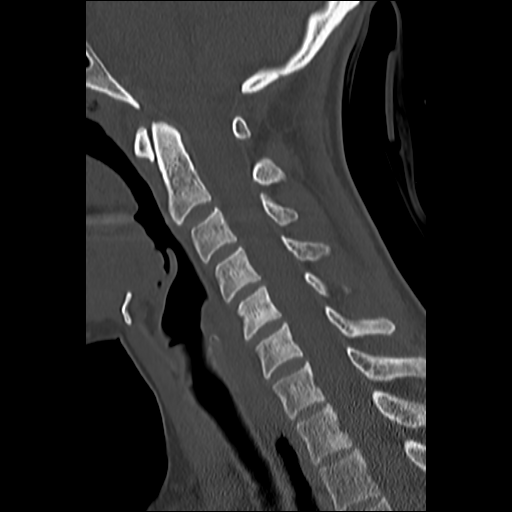
[im 22/33  bone]
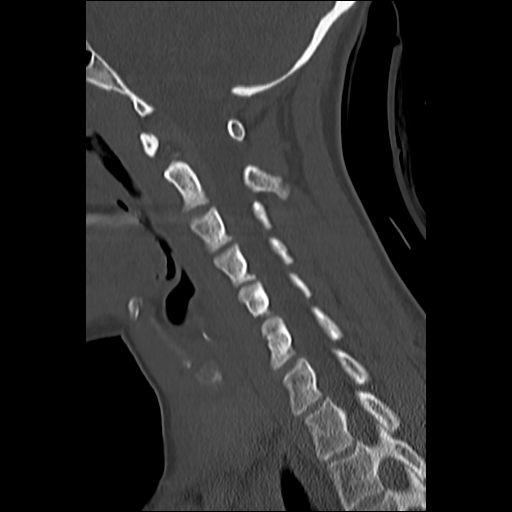

[16 of 33 positions shown; findings below may reference images not displayed]

FINDINGS: Alignment: There is straightening of normal lordosis. No traumatic
malalignment is identified.

Skull base and vertebrae: There is a well corticated calcification
just superior and to the right of the C3 spinous process which has a
nonacute appearance. No acute fractures are seen.

Soft tissues and spinal canal: No prevertebral fluid or swelling. No
visible canal hematoma.

Disc levels:  No significant degenerative changes.

Upper chest: Negative.

Other: No other abnormalities.
IMPRESSION: No acute fracture or traumatic malalignment. The well corticated
calcification adjacent to the C3 spinous process may be sequela of
remote injury. Recommend clinical correlation in this region to
exclude point tenderness.

## 2016-10-07 MED ORDER — IBUPROFEN 800 MG PO TABS: 800.0000 mg | ORAL_TABLET | Freq: Three times a day (TID) | ORAL | 0 refills | Status: DC

## 2016-10-07 MED ORDER — METHOCARBAMOL 500 MG PO TABS: 500.0000 mg | ORAL_TABLET | Freq: Two times a day (BID) | ORAL | 0 refills | Status: DC

## 2016-10-07 MED ORDER — ACETAMINOPHEN 500 MG PO TABS: 500.0000 mg | ORAL_TABLET | Freq: Four times a day (QID) | ORAL | 0 refills | Status: DC | PRN

## 2016-10-07 MED ORDER — IBUPROFEN 800 MG PO TABS
800.0000 mg | ORAL_TABLET | Freq: Once | ORAL | Status: AC
  Administered 2016-10-07: 800 mg via ORAL
  Filled 2016-10-07: qty 1
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
## 2016-10-08 ENCOUNTER — Ambulatory Visit (INDEPENDENT_AMBULATORY_CARE_PROVIDER_SITE_OTHER): Payer: Medicaid Other | Admitting: Orthopaedic Surgery

## 2016-10-08 ENCOUNTER — Encounter (INDEPENDENT_AMBULATORY_CARE_PROVIDER_SITE_OTHER): Payer: Self-pay | Admitting: Orthopaedic Surgery

## 2016-10-08 VITALS — Ht 61.0 in | Wt 99.0 lb

## 2016-10-08 DIAGNOSIS — M542 Cervicalgia: Secondary | ICD-10-CM
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
## 2016-10-14 ENCOUNTER — Emergency Department (HOSPITAL_COMMUNITY): Payer: Worker's Compensation

## 2016-10-14 ENCOUNTER — Emergency Department (HOSPITAL_COMMUNITY)
Admission: EM | Admit: 2016-10-14 | Discharge: 2016-10-14 | Disposition: A | Discharge status: Home / Self Care | Payer: Worker's Compensation | Attending: Emergency Medicine | Admitting: Emergency Medicine

## 2016-10-14 ENCOUNTER — Encounter (HOSPITAL_COMMUNITY): Payer: Self-pay

## 2016-10-14 DIAGNOSIS — S8012XA Contusion of left lower leg, initial encounter: Secondary | ICD-10-CM | POA: Diagnosis not present

## 2016-10-14 DIAGNOSIS — W208XXA Other cause of strike by thrown, projected or falling object, initial encounter: Secondary | ICD-10-CM | POA: Insufficient documentation

## 2016-10-14 DIAGNOSIS — J45909 Unspecified asthma, uncomplicated: Secondary | ICD-10-CM | POA: Diagnosis not present

## 2016-10-14 DIAGNOSIS — Y929 Unspecified place or not applicable: Secondary | ICD-10-CM | POA: Insufficient documentation

## 2016-10-14 DIAGNOSIS — S8991XA Unspecified injury of right lower leg, initial encounter: Secondary | ICD-10-CM | POA: Diagnosis present

## 2016-10-14 DIAGNOSIS — Y990 Civilian activity done for income or pay: Secondary | ICD-10-CM | POA: Insufficient documentation

## 2016-10-14 DIAGNOSIS — Y939 Activity, unspecified: Secondary | ICD-10-CM | POA: Diagnosis not present

## 2016-10-14 DIAGNOSIS — M25572 Pain in left ankle and joints of left foot: Secondary | ICD-10-CM | POA: Insufficient documentation

## 2016-10-14 DIAGNOSIS — Z79899 Other long term (current) drug therapy: Secondary | ICD-10-CM | POA: Insufficient documentation

## 2016-10-14 DIAGNOSIS — M25571 Pain in right ankle and joints of right foot: Secondary | ICD-10-CM

## 2016-10-14 DIAGNOSIS — F17210 Nicotine dependence, cigarettes, uncomplicated: Secondary | ICD-10-CM | POA: Diagnosis not present

## 2016-10-14 IMAGING — DX DG ANKLE COMPLETE 3+V*L*
3 series · 3 of 3 positions shown · non-contrast
Comparison: None.

CLINICAL DATA: Blunt trauma to left ankle last night with pain,
bruising and redness medially.

EXAM:
LEFT ANKLE COMPLETE - 3+ VIEW

[x ankle ap left]
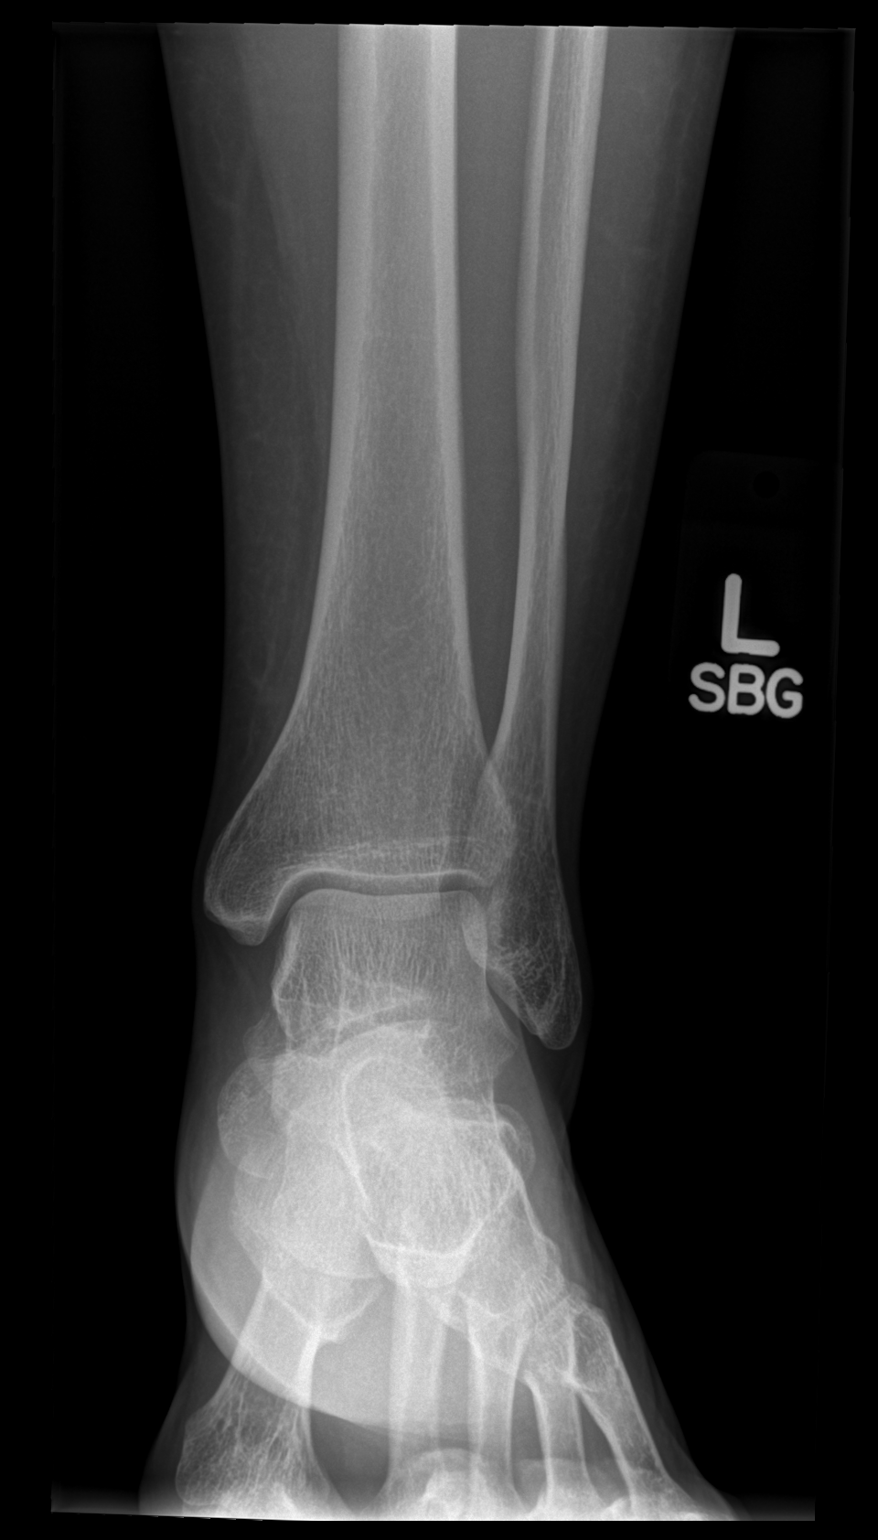

[x ankle obl left]
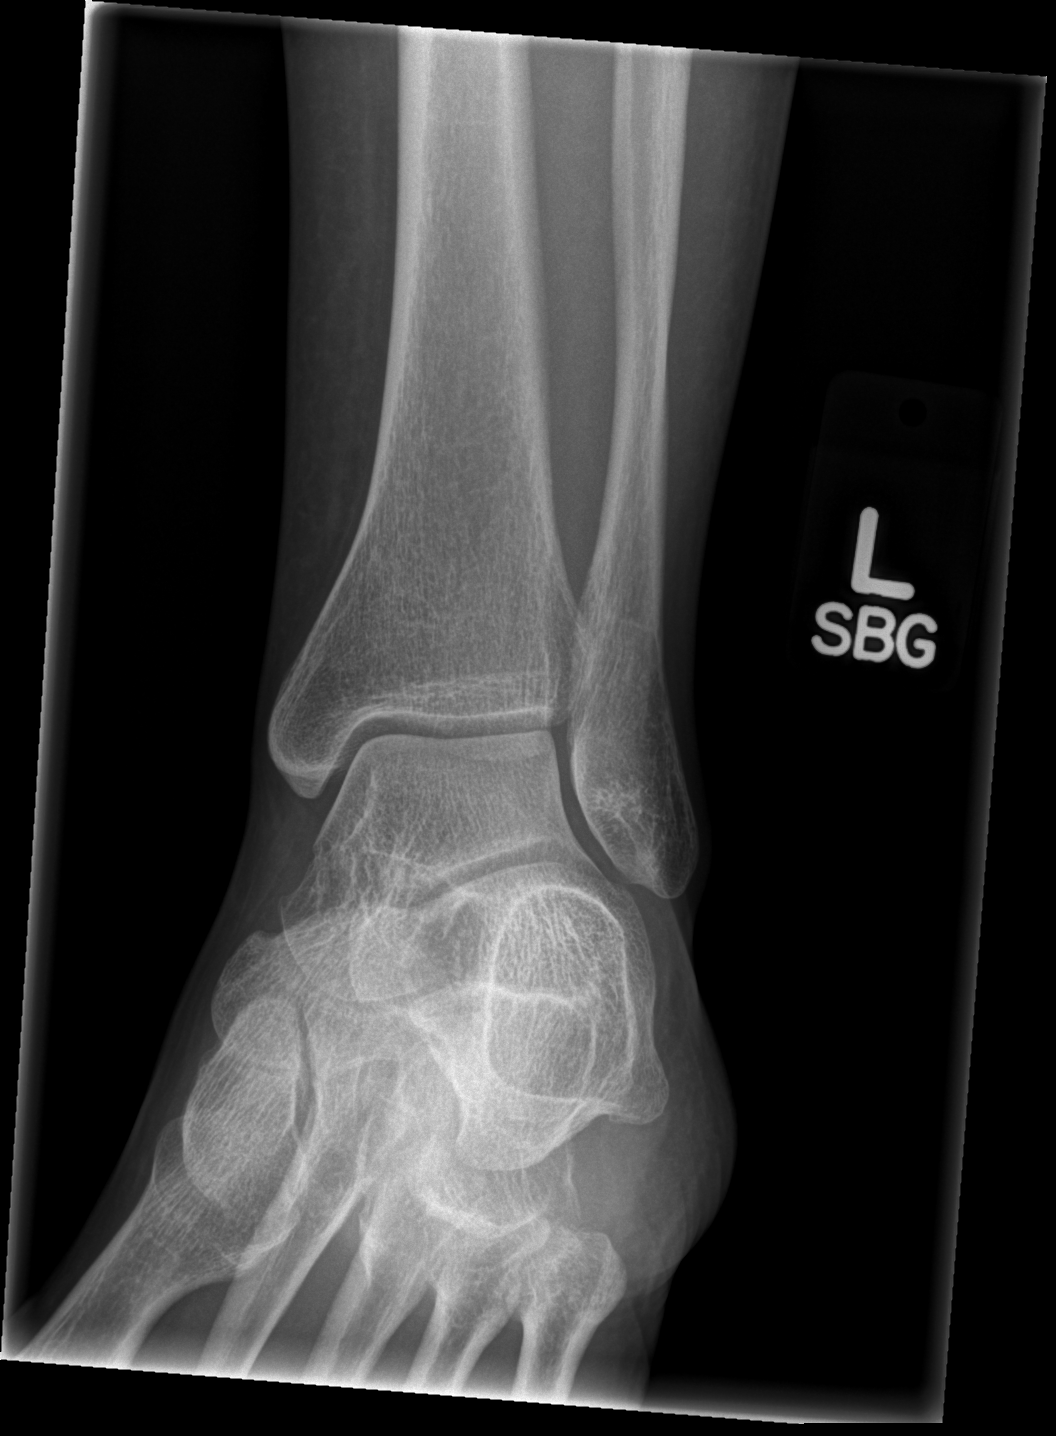

[x ankle lat left]
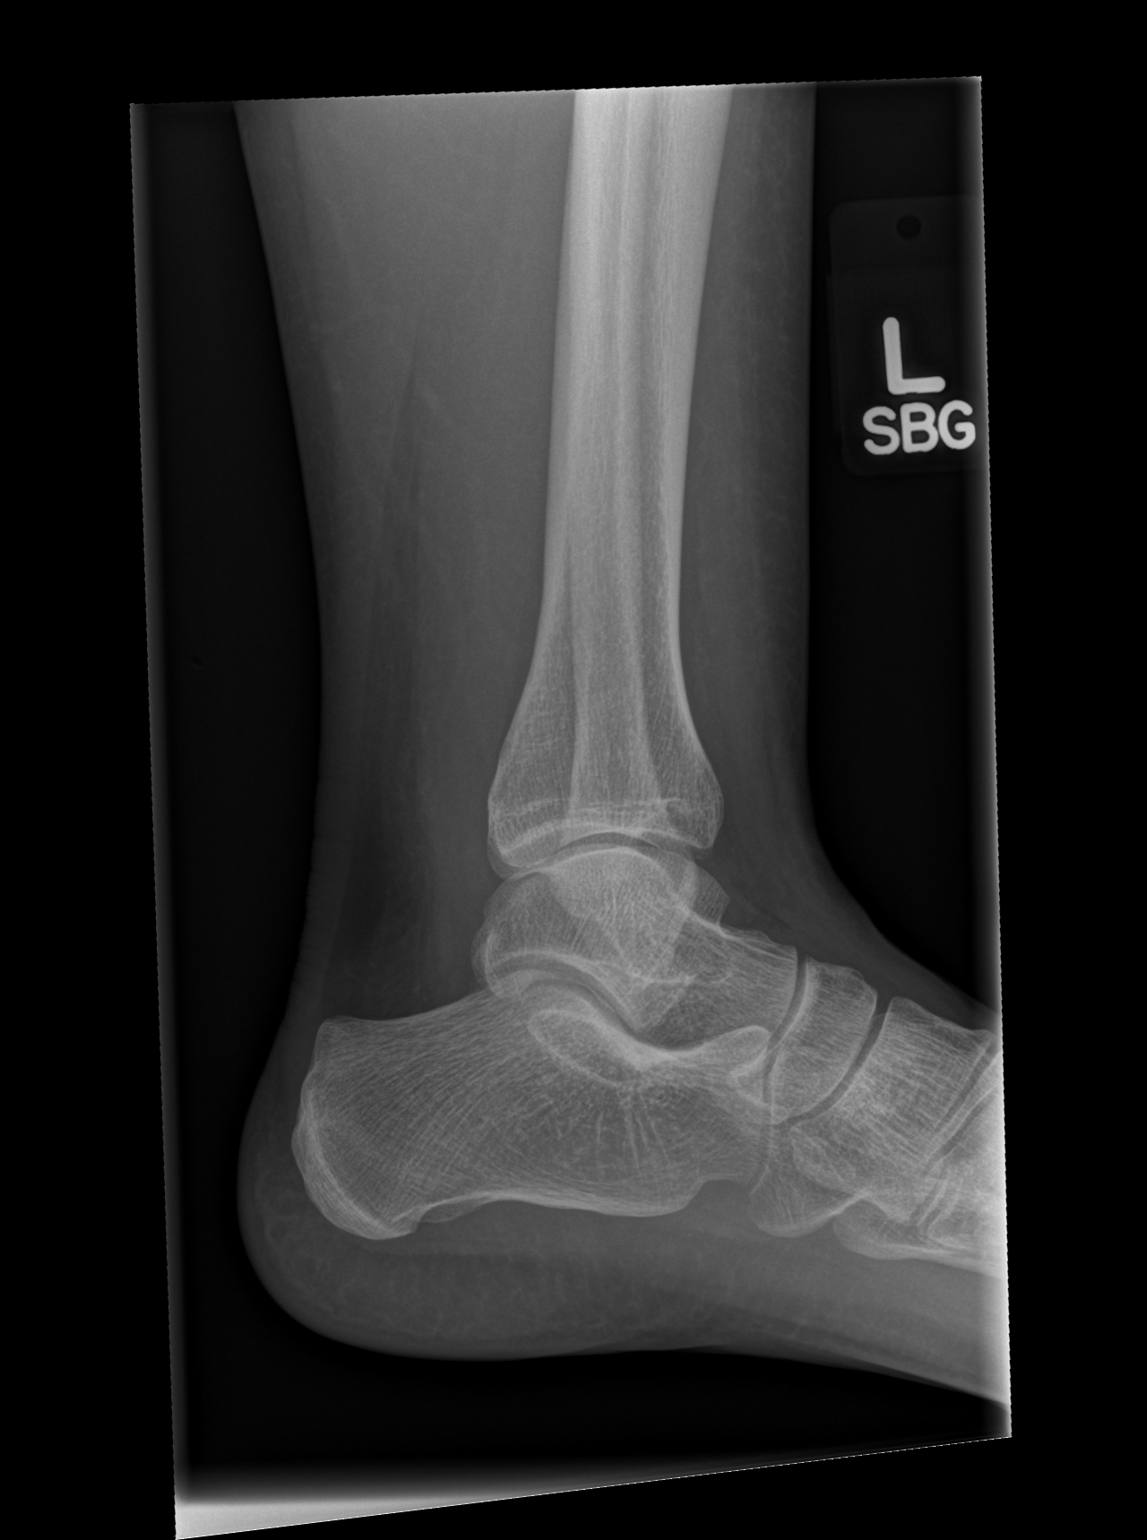

[3 of 3 positions shown; findings below may reference images not displayed]

FINDINGS: There is no evidence of fracture, dislocation, or joint effusion.
There is no evidence of arthropathy or other focal bone abnormality.
Soft tissues are unremarkable.
IMPRESSION: Negative.

## 2016-10-14 NOTE — ED Notes (Signed)
Patient transported to X-ray 

## 2016-10-14 NOTE — Discharge Instructions (Signed)
X-ray showed no fractures. Likely bruised and sprained. Wear the brace for comfort. Limit weightbearing to allow for healing. Use crutches as needed. Please rest, ice, compress and elevated the affected body part to help with swelling and pain.  Follow-up with the orthopedist if symptoms are not improving.

## 2016-10-14 NOTE — ED Triage Notes (Signed)
Patient complains of left ankle pain with bruising after bar stool fell onto ankle last night at work. No foot pain. No deformity

## 2016-10-14 NOTE — ED Provider Notes (Signed)
[facility] DEPT Provider Note   CSN: [id number] Arrival date & time: 10/14/16  1311  By signing my name below, I, Karren Cobble, attest that this documentation has been prepared under the direction and in the presence of Demetrios Loll, PA-C. Electronically Signed: Karren Cobble, ED Scribe. 10/14/16. 3:09 PM.  History   Chief Complaint No chief complaint on file.  The history is provided by the patient. No language interpreter was used.   HPI Comments:  Ann Brown is a 31 y.o. female with a PMHx of asthma, and GERD, who presents to the Emergency Department complaining of gradually worsening, left ankle pain that began yesterday after a bar stool fell onto her leg.  Pt reports she was ambulatory last night but this morning she notes discomfort with ambulation. Pt has been taken her prescribed medication for pain with no relief. Denies any acute associated symptoms at this timeIncluding weakness, paresthesias.  Past Medical History:  Diagnosis Date  . Asthma   . Breast mass    fibroadenomas -   . GERD (gastroesophageal reflux disease)   . Kidney problem    Kideney reflux  . Tobacco use   . URI 03/01/2009    Patient Active Problem List   Diagnosis Date Noted  . Cervicalgia 10/08/2016  . Vaginal Yeast Infection 09/15/2016  . Localized enlarged lymph nodes 09/15/2016  . Mild persistent asthma without complication 08/17/2016  . Suicidal ideation   . Dental caries 11/16/2014  . History of thyroid disease 02/24/2013  . ASCUS on Pap smear 02/01/2013  . Gastroesophageal reflux 12/12/2010  . Adjustment disorder with disturbance of emotion 02/28/2010  . TOBACCO USER 04/25/2009    Past Surgical History:  Procedure Laterality Date  . BOWEL RESECTION    . CHOLECYSTECTOMY    . Complex trunk closure  2005  . CSF SHUNT  1987  . TRACHEOSTOMY  1987    OB History    Gravida Para Term Preterm AB Living   3 2 2   1 2    SAB TAB Ectopic Multiple Live Births           2        Home Medications    Prior to Admission medications   Medication Sig Start Date End Date Taking? Authorizing Provider  acetaminophen (TYLENOL) 500 MG tablet Take 1 tablet (500 mg total) by mouth every 6 (six) hours as needed. 10/07/16   Law, Waylan Boga, PA-C  albuterol (VENTOLIN HFA) 108 (90 Base) MCG/ACT inhaler Inhale 2 puffs into the lungs every 6 (six) hours as needed for wheezing. 08/17/16   Mayo, Allyn Kenner, MD  baclofen (LIORESAL) 10 MG tablet Take 1 tablet (10 mg total) by mouth 3 (three) times daily. 09/26/16   Tarry Kos, MD  esomeprazole (NEXIUM) 40 MG capsule Take 1 capsule (40 mg total) by mouth daily. 08/17/16   Mayo, Allyn Kenner, MD  Fluticasone-Salmeterol (ADVAIR) 100-50 MCG/DOSE AEPB Inhale 1 puff into the lungs 2 (two) times daily. 08/17/16   Mayo, Allyn Kenner, MD  ibuprofen (ADVIL,MOTRIN) 800 MG tablet Take 1 tablet (800 mg total) by mouth 3 (three) times daily. 10/07/16   Law, Waylan Boga, PA-C  methocarbamol (ROBAXIN) 500 MG tablet Take 1 tablet (500 mg total) by mouth 2 (two) times daily. Patient not taking: Reported on 10/08/2016 10/07/16   Emi Holes, PA-C  nicotine (NICODERM CQ - DOSED IN MG/24 HOURS) 21 mg/24hr patch Place 1 patch (21 mg total) onto the skin daily. [facility] [phone] 07/10/13  Moses MannersHensel, William A, MD  norethindrone (ORTHO MICRONOR) 0.35 MG tablet Take 1 tablet (0.35 mg total) by mouth daily. 02/22/16   Araceli Boucheumley, Waldo N, DO  tizanidine (ZANAFLEX) 2 MG capsule 1-2 tab po tid prn spasm pain 08/06/16   Tarry KosXu, Naiping M, MD  tizanidine (ZANAFLEX) 2 MG capsule tid prn 09/18/16   Tarry KosXu, Naiping M, MD    Family History Family History  Problem Relation Age of Onset  . Multiple sclerosis Mother   . Cervical cancer Mother     Social History Social History  Substance Use Topics  . Smoking status: Current Every Day Smoker    Packs/day: 1.00    Types: Cigarettes, E-cigarettes  . Smokeless tobacco: Never Used     Comment: Also uses ecigarette  . Alcohol  use No     Allergies   Patient has no known allergies.   Review of Systems Review of Systems  Constitutional: Negative for fever.  Musculoskeletal: Positive for arthralgias and joint swelling.  Skin: Positive for color change.  Neurological: Negative for weakness and numbness.     Physical Exam Updated Vital Signs BP 117/75 (BP Location: Right Arm)   Pulse 91   Temp 97.9 F (36.6 C) (Oral)   Resp 16   Ht 5\' 1"  (1.549 m)   Wt 105 lb (47.6 kg)   LMP 10/05/2016 (Exact Date)   SpO2 100%   BMI 19.84 kg/m   Physical Exam  Constitutional: She appears well-developed and well-nourished. No distress.  HENT:  Head: Normocephalic and atraumatic.  Eyes: Right eye exhibits no discharge. Left eye exhibits no discharge. No scleral icterus.  Neck: Normal range of motion.  Cardiovascular: Intact distal pulses.   Pulmonary/Chest: No respiratory distress.  Musculoskeletal: Normal range of motion.  Patient with bruising noted over the left distal tibia-fibula. No obvious deformity or open wound. Pain with range of motion of the ankle. No joint laxity. No tenderness over the lateral or medial malleolus. No tenderness or bruising over the midfoot or fifth mediotarsal. No pain with range of motion of the left knee. DP pulses are 2+ bilaterally. Sensation intact. Cap refill normal.  Neurological: She is alert.  Strength 5 out of 5 in lower extremities.  Skin: Skin is warm and dry. Capillary refill takes less than 2 seconds. No pallor.  Psychiatric: Her behavior is normal. Judgment and thought content normal.  Nursing note and vitals reviewed.    ED Treatments / Results  DIAGNOSTIC STUDIES: Oxygen Saturation is 100% on RA, normal by my interpretation.   COORDINATION OF CARE: 2:49 PM-Discussed next steps with pt. Pt verbalized understanding and is agreeable with the plan.   Labs (all labs ordered are listed, but only abnormal results are displayed) Labs Reviewed - No data to  display  EKG  EKG Interpretation None       Radiology Dg Ankle Complete Left  Result Date: 10/14/2016 CLINICAL DATA:  Blunt trauma to left ankle last night with pain, bruising and redness medially. EXAM: LEFT ANKLE COMPLETE - 3+ VIEW COMPARISON:  None. FINDINGS: There is no evidence of fracture, dislocation, or joint effusion. There is no evidence of arthropathy or other focal bone abnormality. Soft tissues are unremarkable. IMPRESSION: Negative. Electronically Signed   By: Elberta Fortisaniel  Boyle M.D.   On: 10/14/2016 15:27    Procedures Procedures (including critical care time)  Medications Ordered in ED Medications - No data to display   Initial Impression / Assessment and Plan / ED Course  I have reviewed the  triage vital signs and the nursing notes.  Pertinent labs & imaging results that were available during my care of the patient were reviewed by me and considered in my medical decision making (see chart for details).     Patient X-Ray negative for obvious fracture or dislocation. Patient is neurovascularly intact. Pain likely due to sprain. Pain managed in ED. Pt advised to follow up with orthopedics if symptoms persist for possibility of missed fracture diagnosis. Patient given brace while in ED, conservative therapy recommended and discussed. Patient will be dc home & is agreeable with above plan.   Final Clinical Impressions(s) / ED Diagnoses   Final diagnoses:  Acute left ankle pain    New Prescriptions Discharge Medication List as of 10/14/2016  4:20 PM        Rise Mu, PA-C 10/14/16 1734    Gerhard Munch, MD 10/14/16 1954

## 2016-10-18 ENCOUNTER — Telehealth (INDEPENDENT_AMBULATORY_CARE_PROVIDER_SITE_OTHER): Payer: Self-pay

## 2016-10-18 NOTE — Telephone Encounter (Signed)
Received call from wc adj wanting to provide wc info for this patient for a left ankle injury as she thought the pt had already treated here. Adv her we have not seen her for this injury yet. She reviewed the ER notes she had and states that per those note she does not need to follow up with ortho at this time and she has reached out to pt to advise her of this.

## 2016-10-23 ENCOUNTER — Telehealth: Payer: Self-pay | Admitting: Student in an Organized Health Care Education/Training Program

## 2016-10-23 ENCOUNTER — Other Ambulatory Visit: Payer: Self-pay | Admitting: Student in an Organized Health Care Education/Training Program

## 2016-10-23 DIAGNOSIS — S93409S Sprain of unspecified ligament of unspecified ankle, sequela: Secondary | ICD-10-CM

## 2016-10-23 NOTE — Telephone Encounter (Signed)
Pt called back and would like to see Dr. Rayburn MaBlackmon. ep

## 2016-10-23 NOTE — Telephone Encounter (Signed)
Patient called today requesting to be referred to:  Special[name] Provider name/phone number:  Diagnosis (code):  Ankle Sprain, workers comp Date of last OV: 09-14-16  Note routed to provider and Referral Coordinator (Tia ViciHill).  Will call patient back regarding referral status.  [name]

## 2016-10-29 ENCOUNTER — Encounter (INDEPENDENT_AMBULATORY_CARE_PROVIDER_SITE_OTHER): Payer: Self-pay | Admitting: Orthopaedic Surgery

## 2016-10-29 ENCOUNTER — Ambulatory Visit (INDEPENDENT_AMBULATORY_CARE_PROVIDER_SITE_OTHER): Payer: Worker's Compensation | Admitting: Orthopaedic Surgery

## 2016-10-29 DIAGNOSIS — S9002XA Contusion of left ankle, initial encounter: Secondary | ICD-10-CM

## 2016-10-29 HISTORY — DX: Contusion of left ankle, initial encounter: S90.02XA

## 2016-10-29 NOTE — Progress Notes (Signed)
Office Visit Note   Patient: Ann Brown           Date of Birth: August 20, 1985           MRN: [id number] Visit Date: 10/29/2016              Requested by: Howard Pouch, MD [address] [place], [place] [address] PCP: Howard Pouch, MD   Assessment & Plan: Visit Diagnoses:  1. Contusion of left ankle, initial encounter    Plan: I gave her reassurance that this is just a deep bone bruise or contusion to the bone and should heal completely with no long-term problems. Since she is still having similar amount of pain we'll keep her off of her ankle out of work for the next 2 weeks. I'll reevaluate her in 2 weeks but no x-rays are needed. I did give her a work note as well.  Follow-Up Instructions: Return in about 2 weeks (around 11/12/2016).   Orders:  No orders of the defined types were placed in this encounter.  No orders of the defined types were placed in this encounter.     Procedures: No procedures performed   Clinical Data: No additional findings.   Subjective: Chief Complaint  Patient presents with  . Left Ankle - Pain  The patient is a very pleasant 31 year old referred from the emergency room after sustaining a work-related injury to her left ankle. This occurred on 10/13/2016. She sustained significant contusion to her ankle. She's been on crutches for the first week or so. She's been in a lace up ankle brace. X-rays were apparently negative for any type of fracture. She's been having trouble sleeping at night as well.  HPI  Review of Systems She currently denies any headache, chest pain, shortness of breath, fever, chills, nausea, vomiting.  Objective: Vital Signs: LMP 10/05/2016 (Exact Date)   Physical Exam She is alert and oriented 3 and in no acute distress Ortho Exam Examination of her left ankle shows minimal bruising. Her range of motion is full. Ankles ligaments stable. She is neurovascularly intact. Specialty Comments:  No specialty comments  available.  Imaging: No results found. X-rays on the canopy system of her left ankle show 3 views ankle. Independently reviewed by me shows no evidence of fracture dislocation malalignment or an acute injury.  PMFS History: Patient Active Problem List   Diagnosis Date Noted  . Contusion of left ankle 10/29/2016  . Cervicalgia 10/08/2016  . Vaginal Yeast Infection 09/15/2016  . Localized enlarged lymph nodes 09/15/2016  . Mild persistent asthma without complication 08/17/2016  . Suicidal ideation   . Dental caries 11/16/2014  . History of thyroid disease 02/24/2013  . ASCUS on Pap smear 02/01/2013  . Gastroesophageal reflux 12/12/2010  . Adjustment disorder with disturbance of emotion 02/28/2010  . TOBACCO USER 04/25/2009   Past Medical History:  Diagnosis Date  . Asthma   . Breast mass    fibroadenomas -   . GERD (gastroesophageal reflux disease)   . Kidney problem    Kideney reflux  . Tobacco use   . URI 03/01/2009    Family History  Problem Relation Age of Onset  . Multiple sclerosis Mother   . Cervical cancer Mother     Past Surgical History:  Procedure Laterality Date  . BOWEL RESECTION    . CHOLECYSTECTOMY    . Complex trunk closure  2005  . CSF SHUNT  1987  . TRACHEOSTOMY  1987   Social History  Occupational History  . Not on file.   Social History Main Topics  . Smoking status: Current Every Day Smoker    Packs/day: 1.00    Types: Cigarettes, E-cigarettes  . Smokeless tobacco: Never Used     Comment: Also uses ecigarette  . Alcohol use No  . Drug use: No  . Sexual activity: Yes    Birth control/ protection: None

## 2016-10-30 ENCOUNTER — Telehealth (INDEPENDENT_AMBULATORY_CARE_PROVIDER_SITE_OTHER): Payer: Self-pay

## 2016-10-30 NOTE — Telephone Encounter (Signed)
Faxed 10/29/16 office note to adj per her request

## 2016-11-12 ENCOUNTER — Ambulatory Visit (INDEPENDENT_AMBULATORY_CARE_PROVIDER_SITE_OTHER): Payer: Self-pay | Admitting: Orthopaedic Surgery

## 2016-11-13 ENCOUNTER — Telehealth (INDEPENDENT_AMBULATORY_CARE_PROVIDER_SITE_OTHER): Payer: Self-pay

## 2016-11-13 NOTE — Telephone Encounter (Signed)
Fax 11/14/16 office and work note to wc adj when available

## 2016-11-14 ENCOUNTER — Ambulatory Visit (INDEPENDENT_AMBULATORY_CARE_PROVIDER_SITE_OTHER): Payer: Worker's Compensation | Admitting: Orthopaedic Surgery

## 2016-11-14 DIAGNOSIS — S93412D Sprain of calcaneofibular ligament of left ankle, subsequent encounter: Secondary | ICD-10-CM | POA: Diagnosis not present

## 2016-11-14 DIAGNOSIS — S93412A Sprain of calcaneofibular ligament of left ankle, initial encounter: Secondary | ICD-10-CM | POA: Insufficient documentation

## 2016-11-14 NOTE — Progress Notes (Signed)
The patient is following her again about a month after injuring her left ankle are work related twisting accident. She still has swelling and pain in her ankle today. She is been in a ASO.  On examination of her ankle today her pain is only medial but is proximal to the ankle joint. The left ankle itself is ligamentously stable full range of motion. She is neurovascular intact as well.  I do feel this ankle sprain or a course. I'll see this being a permanent disability for her at all based on the x-rays and my exam. She'll continue her ASO and I'll keep her out of work until August 6 when she can return to regular work duties. She may want to consider wearing the lace up ankle support release of first week or 2 if she get back to work.

## 2016-11-15 ENCOUNTER — Encounter (INDEPENDENT_AMBULATORY_CARE_PROVIDER_SITE_OTHER): Payer: Self-pay

## 2016-11-15 NOTE — Telephone Encounter (Signed)
faxed

## 2016-12-14 ENCOUNTER — Other Ambulatory Visit: Payer: Self-pay | Admitting: Student in an Organized Health Care Education/Training Program

## 2016-12-14 NOTE — Telephone Encounter (Signed)
Please ask patient to make an appointment for her bacterial infection. It would not be good care for me to prescribe antibiotics without her being evaluated.

## 2016-12-14 NOTE — Telephone Encounter (Signed)
Received message on nurse line from patient stating she has a bacterial infection, she knows what she has since she's had it before. Requesting med to treat be called. Kinnie Feil, RN, BSN

## 2016-12-14 NOTE — Telephone Encounter (Signed)
Pt has BV. She would like a refill on flagell.  She left a message on the nurse line. [facility] [address]

## 2016-12-19 NOTE — Telephone Encounter (Signed)
Spoke with patient on the phone. She states that she already went to ED. Explained that it is not good care for me to prescribe antibiotics over the phone in this situation.

## 2016-12-29 ENCOUNTER — Other Ambulatory Visit: Payer: Self-pay | Admitting: Internal Medicine

## 2016-12-29 DIAGNOSIS — R079 Chest pain, unspecified: Secondary | ICD-10-CM

## 2017-01-09 ENCOUNTER — Ambulatory Visit (INDEPENDENT_AMBULATORY_CARE_PROVIDER_SITE_OTHER): Payer: Medicaid Other | Admitting: Student in an Organized Health Care Education/Training Program

## 2017-01-09 ENCOUNTER — Encounter: Payer: Self-pay | Admitting: Student in an Organized Health Care Education/Training Program

## 2017-01-09 VITALS — BP 102/68 | HR 99 | Temp 98.0°F | Ht 61.0 in | Wt 116.0 lb

## 2017-01-09 DIAGNOSIS — M545 Low back pain, unspecified: Secondary | ICD-10-CM

## 2017-01-09 DIAGNOSIS — G8929 Other chronic pain: Secondary | ICD-10-CM | POA: Diagnosis not present

## 2017-01-09 DIAGNOSIS — R928 Other abnormal and inconclusive findings on diagnostic imaging of breast: Secondary | ICD-10-CM | POA: Diagnosis not present

## 2017-01-09 MED ORDER — DICLOFENAC SODIUM 75 MG PO TBEC: 75.0000 mg | DELAYED_RELEASE_TABLET | Freq: Two times a day (BID) | ORAL | 0 refills | Status: DC | PRN

## 2017-01-09 NOTE — Patient Instructions (Signed)
It was a pleasure seeing you today in our clinic. Here is the treatment plan we have discussed and agreed upon together:  Back Pain Treatment - you should: Take diclofenac up to twice daily as needed for back pain. You can take tylenol no more than 3000 mg daily for pain.  Call us or go to the ER if you lose control of your bowels or bladder or are weak in one leg  Come back to see [place] in 1-2 months.  Our clinic's number is [phone]. Please call with questions or concerns about what we discussed today.  Be well, Dr. Mosetta Putt
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
## 2017-01-11 DIAGNOSIS — R928 Other abnormal and inconclusive findings on diagnostic imaging of breast: Secondary | ICD-10-CM | POA: Insufficient documentation

## 2017-01-11 NOTE — Assessment & Plan Note (Addendum)
Last mammogram available in our system is from 2007, and showed likely fibroadenomas in bilateral breasts. Breast exam was notable for nodularity bilaterally, however this can be normal. Previous mammogram recommended follow-up in 6 months, however does not seem that there was any follow-up study performed. - No B symptoms by history, weight stable - Patient referred for mammogram at today's visit
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
## 2017-01-15 ENCOUNTER — Telehealth: Payer: Self-pay | Admitting: Student in an Organized Health Care Education/Training Program

## 2017-01-15 ENCOUNTER — Other Ambulatory Visit: Payer: Self-pay | Admitting: Student in an Organized Health Care Education/Training Program

## 2017-01-15 DIAGNOSIS — J45909 Unspecified asthma, uncomplicated: Secondary | ICD-10-CM

## 2017-01-15 NOTE — Telephone Encounter (Signed)
Dr Mosetta Putt referred pt to the breast center at her last visit. Breast center is telling pt they will not schedule an appt until a referral is faxed from the office.  Please advise

## 2017-01-15 NOTE — Telephone Encounter (Signed)
No order in Epic, will forward to MD. Brenin Heidelberger, Maryjo Rochester, CMA

## 2017-01-16 ENCOUNTER — Other Ambulatory Visit: Payer: Self-pay | Admitting: Student in an Organized Health Care Education/Training Program

## 2017-01-16 DIAGNOSIS — R928 Other abnormal and inconclusive findings on diagnostic imaging of breast: Secondary | ICD-10-CM

## 2017-01-16 NOTE — Telephone Encounter (Signed)
OK. I placed a referral placed in epic.

## 2017-01-16 NOTE — Telephone Encounter (Signed)
LVM for pt to call the office. Is she calls, please let her know the referral for the breast center has been put in. She can call and schedule an appt. Sunday Spillers, CMA

## 2017-01-17 NOTE — Telephone Encounter (Signed)
LVM for pt to call back to inform her of below. Please inform her if she calls. Ann Brown, Sherin Murdoch D, [place]

## 2017-01-17 NOTE — Telephone Encounter (Signed)
Patient notified referral has been placed. Kinnie Feil, RN, BSN

## 2017-01-23 ENCOUNTER — Other Ambulatory Visit: Payer: Self-pay | Admitting: Student in an Organized Health Care Education/Training Program

## 2017-01-23 DIAGNOSIS — N63 Unspecified lump in unspecified breast: Secondary | ICD-10-CM

## 2017-01-25 ENCOUNTER — Other Ambulatory Visit: Payer: Self-pay | Admitting: Student in an Organized Health Care Education/Training Program

## 2017-01-25 ENCOUNTER — Telehealth: Payer: Self-pay | Admitting: *Deleted

## 2017-01-25 NOTE — Telephone Encounter (Signed)
Patient called and left message on nurse triage line - unable to take diclofenac because it is causing "severe pain where my gallbladder used to be".  Called patient for additional info and left message to call our office back.  Will also route note to Dr. Mosetta Putt for advice.  Altamese Dilling, BSN, RN-BC

## 2017-01-25 NOTE — Telephone Encounter (Signed)
Patient should stop taking diclofenac if it is causing abdominal pain. She can use tylenol as needed for back pain, no more than 3000 mg per day.

## 2017-01-28 NOTE — Telephone Encounter (Signed)
Called pt. Phone rang. No voicemail. If pt calls, please give her the information below.  Sunday Spillers, CMA

## 2017-01-29 ENCOUNTER — Other Ambulatory Visit (INDEPENDENT_AMBULATORY_CARE_PROVIDER_SITE_OTHER): Payer: Self-pay | Admitting: Orthopaedic Surgery

## 2017-01-29 NOTE — Telephone Encounter (Signed)
Tried to contact pt and phone said call could not be completed.  If pt calls back please inform her of below. Lamonte Sakai, April D, [place]

## 2017-01-29 NOTE — Telephone Encounter (Signed)
Please advise 

## 2017-01-29 NOTE — Telephone Encounter (Signed)
Rx refill [facility]acl[facility]en [facility]  [address]

## 2017-01-29 NOTE — Telephone Encounter (Signed)
#  30.  No more refills from me.  Needs to see PCP

## 2017-01-30 MED ORDER — BACLOFEN 10 MG PO TABS: 10.0000 mg | ORAL_TABLET | Freq: Three times a day (TID) | ORAL | 3 refills | Status: DC

## 2017-01-30 NOTE — Telephone Encounter (Signed)
rx faxed to pharmacy. No answer LMVM per Dr Roda Shutters note about future refills.

## 2017-01-31 ENCOUNTER — Other Ambulatory Visit: Payer: Self-pay

## 2017-02-10 ENCOUNTER — Other Ambulatory Visit: Payer: Self-pay | Admitting: Student in an Organized Health Care Education/Training Program

## 2017-02-19 ENCOUNTER — Telehealth: Payer: Self-pay | Admitting: *Deleted

## 2017-02-19 NOTE — Telephone Encounter (Signed)
Pt LM on nurse line. Seh is having blood in her urine and low back pain.   Returned call, pt states that it just started today.  Advised that she needed to be seen and that I could put her on the overflow.  Pt is reluctant since she has to be to work @ 5.  Upon further discussion pt tells me she has an appt at [facility] today @ 2:30 because she is an established patient there.  Advised to keep appt there as they would be able to get her in faster. Levon Penning, Maryjo RochesterJessica Dawn, CMA

## 2017-05-01 ENCOUNTER — Ambulatory Visit
Admission: RE | Admit: 2017-05-01 | Discharge: 2017-05-01 | Disposition: A | Discharge status: Home / Self Care | Payer: Medicaid Other | Source: Ambulatory Visit | Attending: Family Medicine | Admitting: Family Medicine

## 2017-05-01 DIAGNOSIS — N63 Unspecified lump in unspecified breast: Secondary | ICD-10-CM

## 2017-05-02 ENCOUNTER — Encounter: Payer: Self-pay | Admitting: Student in an Organized Health Care Education/Training Program

## 2017-05-27 ENCOUNTER — Ambulatory Visit: Payer: Medicaid Other | Admitting: Student in an Organized Health Care Education/Training Program

## 2017-05-27 ENCOUNTER — Other Ambulatory Visit: Payer: Self-pay

## 2017-05-27 ENCOUNTER — Encounter: Payer: Self-pay | Admitting: Student in an Organized Health Care Education/Training Program

## 2017-05-27 VITALS — BP 104/72 | HR 83 | Temp 98.0°F | Ht 61.0 in | Wt 116.8 lb

## 2017-05-27 DIAGNOSIS — R21 Rash and other nonspecific skin eruption: Secondary | ICD-10-CM | POA: Diagnosis not present

## 2017-05-27 DIAGNOSIS — L989 Disorder of the skin and subcutaneous tissue, unspecified: Secondary | ICD-10-CM

## 2017-05-27 DIAGNOSIS — J4530 Mild persistent asthma, uncomplicated: Secondary | ICD-10-CM | POA: Diagnosis not present

## 2017-05-27 DIAGNOSIS — R928 Other abnormal and inconclusive findings on diagnostic imaging of breast: Secondary | ICD-10-CM

## 2017-05-27 LAB — POCT SKIN KOH: Skin KOH, POC: NEGATIVE

## 2017-05-27 MED ORDER — MICONAZOLE NITRATE 2 % EX CREA: 1.0000 "application " | TOPICAL_CREAM | Freq: Two times a day (BID) | CUTANEOUS | 0 refills | Status: AC

## 2017-05-27 MED ORDER — ALBUTEROL SULFATE HFA 108 (90 BASE) MCG/ACT IN AERS: 2.0000 | INHALATION_SPRAY | Freq: Four times a day (QID) | RESPIRATORY_TRACT | 3 refills | Status: DC | PRN

## 2017-05-27 MED ORDER — BACLOFEN 10 MG PO TABS: 10.0000 mg | ORAL_TABLET | Freq: Three times a day (TID) | ORAL | 3 refills | Status: DC

## 2017-05-27 MED ORDER — FLUTICASONE-SALMETEROL 100-50 MCG/DOSE IN AEPB: 1.0000 | INHALATION_SPRAY | Freq: Two times a day (BID) | RESPIRATORY_TRACT | 5 refills | Status: DC

## 2017-05-27 NOTE — Progress Notes (Signed)
CC: L flank rash  HPI: Ann Brown is a 32 y.o. female.  Breast masses Patient was recently sent for mammogram and ultrasound due to history of L breast masses that had not been followed up. These were found to be stable on recent imaging with no evidence of malignancy. Recommendation was to have patient plan for yearly mammograms to continue to follow them. She asks for her mammogram and ultrasound results today which were printed and handed to her in the office.  Left flank rash Patient noticed small L flank rash a few days ago which has been gradually growing. She has not noticed any surrounding erythema, or drainage. She has not had any fevers or other symptoms. The rash is not pruritic and she has not had anything similar to it in the past.  Abdominal pimple  Patient has a tiny abdominal pimple that she states she has had since she was 18 and she occasionally is able to express pus. It is open and not currently draining. She wants to know if there is anything I can do to help it resolve. She denies fevers, surrounding erythema or induration. She has not been putting anything on it.  She asks for refills on her muscle relaxer for chronic back pain.  Review of Symptoms:  See HPI for ROS.   CC, SH/smoking status, and VS noted.  Objective: BP 104/72   Pulse 83   Temp 98 F (36.7 C) (Oral)   Ht 5\' 1"  (1.549 m)   Wt 116 lb 12.8 oz (53 kg)   LMP 05/19/2017 (Exact Date)   SpO2 99%   BMI 22.07 kg/m  GEN: NAD, alert, cooperative, and pleasant. SKIN: Flaky, dry, circular rash with clear margins, about 1 cm in diameter. No surrounding erythema or warmth Small <1cm bump noted over her umbilicus with small  NEURO: II-XII grossly intact, normal gait, peripheral sensation intact PSYCH: AAOx3, appropriate affect  Assessment and plan:  Abnormal mammogram Discussed mammo results with patient and provided paper with her results on it. Plan for repeat mammogram in 1 year.  Rash  and nonspecific skin eruption KOH negative, however rash looks like ringworm. Will go ahead and treat for ringworm with miconazole. Follow up as needed if symptoms worsen or fail to improve.  Skin lesion Abdominal wall lesion, may be chronic pimple or a tiny abscess. Seems less likely to be a cyst since it is open and draining per patient. Considered sending her to procedures clinic, however I am not sure if there is anything that can be done since the lesion is draining on its own. Patient to continue to monitor at home. If develops signs of local infection she should return to care.   Orders Placed This Encounter  Procedures  . POCT Skin KOH    Meds ordered this encounter  Medications  . albuterol (VENTOLIN HFA) 108 (90 Base) MCG/ACT inhaler    Sig: Inhale 2 puffs into the lungs every 6 (six) hours as needed for wheezing.    Dispense:  2 Inhaler    Refill:  3  . Fluticasone-Salmeterol (ADVAIR) 100-50 MCG/DOSE AEPB    Sig: Inhale 1 puff into the lungs 2 (two) times daily.    Dispense:  60 each    Refill:  5  . baclofen (LIORESAL) 10 MG tablet    Sig: Take 1 tablet (10 mg total) by mouth 3 (three) times daily.    Dispense:  30 each    Refill:  3  .  miconazole (MICATIN) 2 % cream    Sig: Apply 1 application topically 2 (two) times daily for 14 days.    Dispense:  28 g    Refill:  0   Howard Pouch, MD,MS,  PGY2 05/28/2017 2:54 PM

## 2017-05-27 NOTE — Patient Instructions (Addendum)
It was a pleasure seeing you today in our clinic. Here is the treatment plan we have discussed and agreed upon together:  Use miconazole topical twice daily on your rash.  Our clinic's number is [phone]. Please call with questions or concerns about what we discussed today.  Be well, Dr. Mosetta PuttFeng

## 2017-05-28 ENCOUNTER — Encounter: Payer: Self-pay | Admitting: Student in an Organized Health Care Education/Training Program

## 2017-05-28 DIAGNOSIS — L989 Disorder of the skin and subcutaneous tissue, unspecified: Secondary | ICD-10-CM | POA: Insufficient documentation

## 2017-05-28 DIAGNOSIS — R21 Rash and other nonspecific skin eruption: Secondary | ICD-10-CM | POA: Insufficient documentation

## 2017-05-28 NOTE — Assessment & Plan Note (Signed)
Abdominal wall lesion, may be chronic pimple or a tiny abscess. Seems less likely to be a cyst since it is open and draining per patient. Considered sending her to procedures clinic, however I am not sure if there is anything that can be done since the lesion is draining on its own. Patient to continue to monitor at home. If develops signs of local infection she should return to care.

## 2017-05-28 NOTE — Assessment & Plan Note (Addendum)
KOH negative, however rash looks like ringworm. Will go ahead and treat for ringworm with miconazole. Follow up as needed if symptoms worsen or fail to improve.

## 2017-05-28 NOTE — Assessment & Plan Note (Signed)
Discussed mammo results with patient and provided paper with her results on it. Plan for repeat mammogram in 1 year.

## 2017-07-12 ENCOUNTER — Encounter: Payer: Self-pay | Admitting: Sports Medicine

## 2017-07-12 ENCOUNTER — Ambulatory Visit (INDEPENDENT_AMBULATORY_CARE_PROVIDER_SITE_OTHER): Payer: Medicaid Other | Admitting: Sports Medicine

## 2017-07-12 VITALS — BP 102/70 | HR 98 | Ht 61.0 in | Wt 122.4 lb

## 2017-07-12 DIAGNOSIS — M9901 Segmental and somatic dysfunction of cervical region: Secondary | ICD-10-CM | POA: Diagnosis not present

## 2017-07-12 DIAGNOSIS — M542 Cervicalgia: Secondary | ICD-10-CM | POA: Diagnosis not present

## 2017-07-12 DIAGNOSIS — F17200 Nicotine dependence, unspecified, uncomplicated: Secondary | ICD-10-CM

## 2017-07-12 DIAGNOSIS — M9908 Segmental and somatic dysfunction of rib cage: Secondary | ICD-10-CM

## 2017-07-12 DIAGNOSIS — M9902 Segmental and somatic dysfunction of thoracic region: Secondary | ICD-10-CM | POA: Diagnosis not present

## 2017-07-12 MED ORDER — METHOCARBAMOL 500 MG PO TABS: 500.0000 mg | ORAL_TABLET | Freq: Three times a day (TID) | ORAL | 1 refills | Status: DC | PRN

## 2017-07-12 MED ORDER — METHYLPREDNISOLONE 4 MG PO TBPK: ORAL_TABLET | ORAL | 0 refills | Status: DC

## 2017-07-12 NOTE — Patient Instructions (Signed)
Please perform the exercise program that we have prepared for you and gone over in detail on a daily basis.  In addition to the handout you were provided you can access your program through: [email or web address]   Your unique program code is:  [email or web address]  Also check out "[employer]" which is a program developed by Dr. Myles LippsEric Goodman.   There are links to a couple of his YouTube Videos below and I would like to see performing one of his videos 5-6 days per week.    A good intro video is: "Independence from Pain 7-minute Video" - [email or web address]/[email or web address]   His more advanced video is: "Powerful Posture and Pain Relief: 12 minutes of Foundation Training" - https://youtu.be/[email or web address]  Do not try to attempt this entire video when first beginning.    Try breaking of each exercise that he goes into shorter segments.  Otherwise if they perform an exercise for 45 seconds, start with 15 seconds and rest and then resume when they begin the new activity.    If you work your way up to doing this 12 minute video, I expect you will see significant improvements in your pain.  If you enjoy his videos and would like to find out more you can look on his website: [email or web address].  He has a workout streaming option as well as a DVD set available for purchase.  Amazon has the best price for his DVDs.

## 2017-07-12 NOTE — Progress Notes (Signed)

## 2017-07-12 NOTE — Procedures (Signed)
PROCEDURE NOTE : OSTEOPATHIC MANIPULATION The decision today to treat with Osteopathic Manipulative Therapy (OMT) was based on physical exam findings. Verbal consent was obtained following a discussion with the patient regarding the of risks, benefits and potential side effects, including an acute pain flare,post manipulation soreness and need for repeat treatments. Additionally, we specifically discussed the minimal risk of  injury to neurovascular structures associated with Cervical manipulation.   Contraindications to OMT reviewed and include: NONE  Manipulation was performed as below: Regions treated: Cervical spine, Ribs and Thoracic spine OMT Techniques Used: HVLA, muscle energy and myofascial release  The patient tolerated the treatment well and reported Improved symptoms following treatment today. Patient was given medications, exercises, stretches and lifestyle modifications per AVS and verbally.   OSTEOPATHIC/STRUCTURAL EXAM:   AA rotated right, C2 through C4 rotated left side bent right T1 ERS left T2 through T4 neutral rotated right, side bent left Posterior rib 5
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
## 2017-07-12 NOTE — Progress Notes (Signed)
   Ann FellsMichael D. Delorise Shinerigby, DO  [facility] [phone]  Shade FloodVictoria N Pfahler - 32 y.o. female MRN [id number]  Date of birth: 05/03/1985  Visit Date:   PCP: Howard PouchFeng, Lauren, MD   Referred by: Howard PouchFeng, Lauren, MD  Please see additional documentation for HPI, review of systems.   HISTORY & PERTINENT PRIOR DATA:  Prior History reviewed and updated per electronic medical record.  Significant/pertinent history, findings, studies include:  reports that she has been smoking cigarettes and e-cigarettes.  She has been smoking about 1.00 pack per day. She has never used smokeless tobacco. No results for input(s): HGBA1C, LABURIC, CREATINE in the last 8760 hours. No specialty comments available. No problems updated.  OBJECTIVE:  VS:  HT:5\' 1"  (154.9 cm)   WT:122 lb 6.4 oz (55.5 kg)  BMI:23.14    BP:102/70  HR:98bpm  TEMP: ( )  RESP:98 %   PHYSICAL EXAM: Constitutional: WDWN, Non-toxic appearing. Psychiatric: Alert & appropriately interactive.  Not depressed or anxious appearing. Respiratory: No increased work of breathing.  Trachea Midline Eyes: Pupils are equal.  EOM intact without nystagmus.  No scleral icterus  Vascular Exam: warm to touch no edema  upper and lower extremity neuro exam: unremarkable normal strength normal sensation normal reflexes  MSK Exam: Negative Lhermitte's compression test Spurling's compression test although she has some pain with brachial plexus squeeze is nonradiating.  Limited side bending and rotation.  Anterior predominant chain   ASSESSMENT & PLAN:   1. Neck pain   2. Somatic dysfunction of cervical region   3. Somatic dysfunction of thoracic region   4. Somatic dysfunction of rib cage region   5. Tobacco use disorder     PLAN: Osteopathic manipulation was performed today based on physical exam findings.  Please see procedure note for further information including Osteopathic Exam findings.  Given the  severity of her symptoms we will plan to check back with her in 1 week as well as start Medrol Dosepak and muscle relaxant.  Follow-up: Return in about 1 week (around 07/19/2017).       Please see additional documentation for Objective, Assessment and Plan sections. Pertinent additional documentation may be included in corresponding procedure notes, imaging studies, problem based documentation and patient instructions. Please see these sections of the encounter for additional information regarding this visit.  CMA/ATC served as Neurosurgeonscribe during this visit. History, Physical, and Plan performed by medical provider. Documentation and orders reviewed and attested to.      Andrena MewsMichael D Azreal Stthomas, DO    [facility] Physician

## 2017-07-17 ENCOUNTER — Telehealth: Payer: Self-pay | Admitting: Student in an Organized Health Care Education/Training Program

## 2017-07-17 NOTE — Telephone Encounter (Signed)
Pt called requesting another referral to pain management. She said she got a referral 9 months ago and was put on a waitlist and now that it's time to schedule they are asking for another referral. She would like another referral to another pain management clinic. Please advise

## 2017-07-18 ENCOUNTER — Other Ambulatory Visit: Payer: Self-pay | Admitting: Student in an Organized Health Care Education/Training Program

## 2017-07-18 DIAGNOSIS — M545 Low back pain: Principal | ICD-10-CM

## 2017-07-18 DIAGNOSIS — G8929 Other chronic pain: Secondary | ICD-10-CM

## 2017-07-18 NOTE — Telephone Encounter (Signed)
Referral to pain management was previously placed by ortho per patient. I put in an ambulatory referral for her today.

## 2017-07-19 ENCOUNTER — Emergency Department (HOSPITAL_COMMUNITY)
Admission: EM | Admit: 2017-07-19 | Discharge: 2017-07-19 | Disposition: A | Discharge status: Home / Self Care | Payer: Medicaid Other | Attending: Emergency Medicine | Admitting: Emergency Medicine

## 2017-07-19 ENCOUNTER — Encounter (HOSPITAL_COMMUNITY): Payer: Self-pay | Admitting: *Deleted

## 2017-07-19 ENCOUNTER — Ambulatory Visit: Payer: Medicaid Other | Admitting: Sports Medicine

## 2017-07-19 DIAGNOSIS — Z5321 Procedure and treatment not carried out due to patient leaving prior to being seen by health care provider: Secondary | ICD-10-CM | POA: Diagnosis not present

## 2017-07-19 DIAGNOSIS — R1110 Vomiting, unspecified: Secondary | ICD-10-CM | POA: Insufficient documentation

## 2017-07-19 DIAGNOSIS — R197 Diarrhea, unspecified: Secondary | ICD-10-CM | POA: Diagnosis not present

## 2017-07-19 LAB — CBC
HCT: 47.7 % — ABNORMAL HIGH (ref 36.0–46.0)
Hemoglobin: 16.1 g/dL — ABNORMAL HIGH (ref 12.0–15.0)
MCH: 35 pg — ABNORMAL HIGH (ref 26.0–34.0)
MCHC: 33.8 g/dL (ref 30.0–36.0)
MCV: 103.7 fL — AB (ref 78.0–100.0)
PLATELETS: 193 10*3/uL (ref 150–400)
RBC: 4.6 MIL/uL (ref 3.87–5.11)
RDW: 12.8 % (ref 11.5–15.5)
WBC: 13.7 10*3/uL — AB (ref 4.0–10.5)

## 2017-07-19 LAB — COMPREHENSIVE METABOLIC PANEL
ALT: 34 U/L (ref 14–54)
AST: 27 U/L (ref 15–41)
Albumin: 3.8 g/dL (ref 3.5–5.0)
Alkaline Phosphatase: 68 U/L (ref 38–126)
Anion gap: 9 (ref 5–15)
BUN: 25 mg/dL — ABNORMAL HIGH (ref 6–20)
CALCIUM: 8.7 mg/dL — AB (ref 8.9–10.3)
CHLORIDE: 106 mmol/L (ref 101–111)
CO2: 25 mmol/L (ref 22–32)
CREATININE: 0.61 mg/dL (ref 0.44–1.00)
GFR calc non Af Amer: >60 mL/min (ref >60)
Glucose, Bld: 130 mg/dL — ABNORMAL HIGH (ref 65–99)
Potassium: 3.4 mmol/L — ABNORMAL LOW (ref 3.5–5.1)
SODIUM: 140 mmol/L (ref 135–145)
Total Bilirubin: 1.1 mg/dL (ref 0.3–1.2)
Total Protein: 6.8 g/dL (ref 6.5–8.1)

## 2017-07-19 LAB — LIPASE, BLOOD: Lipase: 24 U/L (ref 11–51)

## 2017-07-19 LAB — I-STAT BETA HCG BLOOD, ED (MC, WL, AP ONLY)

## 2017-07-19 MED ORDER — ONDANSETRON 4 MG PO TBDP
4.0000 mg | ORAL_TABLET | Freq: Once | ORAL | Status: AC
  Administered 2017-07-19: 4 mg via ORAL
  Filled 2017-07-19: qty 1

## 2017-07-19 NOTE — ED Notes (Signed)
Pt called for a room.  No answer 

## 2017-07-19 NOTE — ED Notes (Signed)
Pt not answer for a room in the lobby

## 2017-07-19 NOTE — ED Triage Notes (Signed)
Pt in c/o emesis onset today at 2am with 15-20 vomiting episodes, pt reports x 2 loose stools today, denies CP, SOB, A&O x4

## 2017-07-20 ENCOUNTER — Encounter: Payer: Self-pay | Admitting: Sports Medicine

## 2017-08-12 ENCOUNTER — Telehealth: Payer: Self-pay

## 2017-08-12 NOTE — Telephone Encounter (Signed)
Received fax from [facility] requesting prior authorization of Advair Diskus.Form placed in MD's box for completion along with Medicaid formulary.  Ples SpecterAlisa Brake, RN [facility])

## 2017-08-17 NOTE — Telephone Encounter (Signed)
Completed/placed in nurse box 

## 2017-08-20 NOTE — Telephone Encounter (Signed)
Denied per [place] [employer], patient has family planning medicaid only. [facility] aware. Ples Specter, RN [facility] RN)

## 2018-09-08 ENCOUNTER — Other Ambulatory Visit: Payer: Self-pay

## 2018-09-08 ENCOUNTER — Emergency Department (HOSPITAL_COMMUNITY): Payer: Self-pay

## 2018-09-08 ENCOUNTER — Encounter (HOSPITAL_COMMUNITY): Payer: Self-pay

## 2018-09-08 ENCOUNTER — Emergency Department (HOSPITAL_COMMUNITY)
Admission: EM | Admit: 2018-09-08 | Discharge: 2018-09-08 | Disposition: A | Discharge status: Home / Self Care | Payer: Self-pay | Attending: Emergency Medicine | Admitting: Emergency Medicine

## 2018-09-08 DIAGNOSIS — F17210 Nicotine dependence, cigarettes, uncomplicated: Secondary | ICD-10-CM | POA: Insufficient documentation

## 2018-09-08 DIAGNOSIS — S99922A Unspecified injury of left foot, initial encounter: Secondary | ICD-10-CM | POA: Insufficient documentation

## 2018-09-08 DIAGNOSIS — J45909 Unspecified asthma, uncomplicated: Secondary | ICD-10-CM | POA: Insufficient documentation

## 2018-09-08 DIAGNOSIS — Y9301 Activity, walking, marching and hiking: Secondary | ICD-10-CM | POA: Insufficient documentation

## 2018-09-08 DIAGNOSIS — Z79899 Other long term (current) drug therapy: Secondary | ICD-10-CM | POA: Insufficient documentation

## 2018-09-08 DIAGNOSIS — Y999 Unspecified external cause status: Secondary | ICD-10-CM | POA: Insufficient documentation

## 2018-09-08 DIAGNOSIS — Y9289 Other specified places as the place of occurrence of the external cause: Secondary | ICD-10-CM | POA: Insufficient documentation

## 2018-09-08 DIAGNOSIS — X500XXA Overexertion from strenuous movement or load, initial encounter: Secondary | ICD-10-CM | POA: Insufficient documentation

## 2018-09-08 IMAGING — DX LEFT FOOT - COMPLETE 3+ VIEW
3 series · 3 of 3 positions shown · non-contrast
Comparison: None.

CLINICAL DATA: Foot pain.

EXAM:
LEFT FOOT - COMPLETE 3+ VIEW; LEFT ANKLE COMPLETE - 3+ VIEW

[x foot ap left]
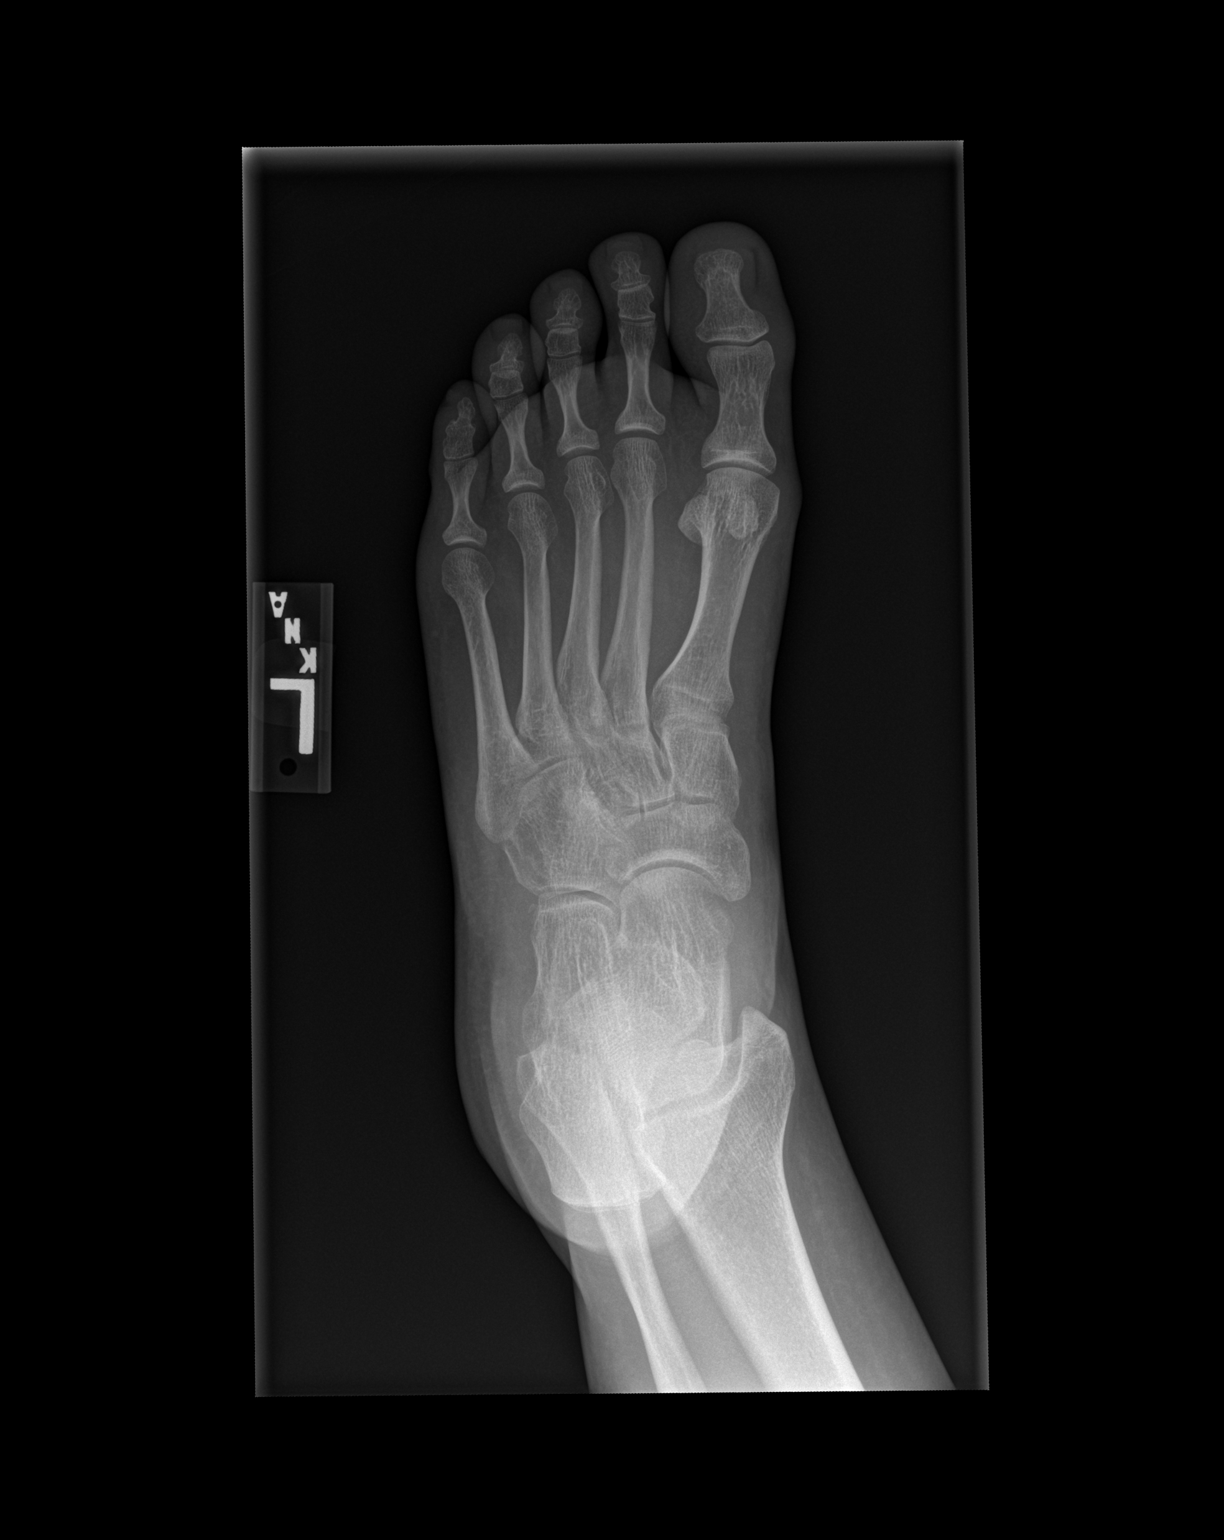

[x foot obl left]
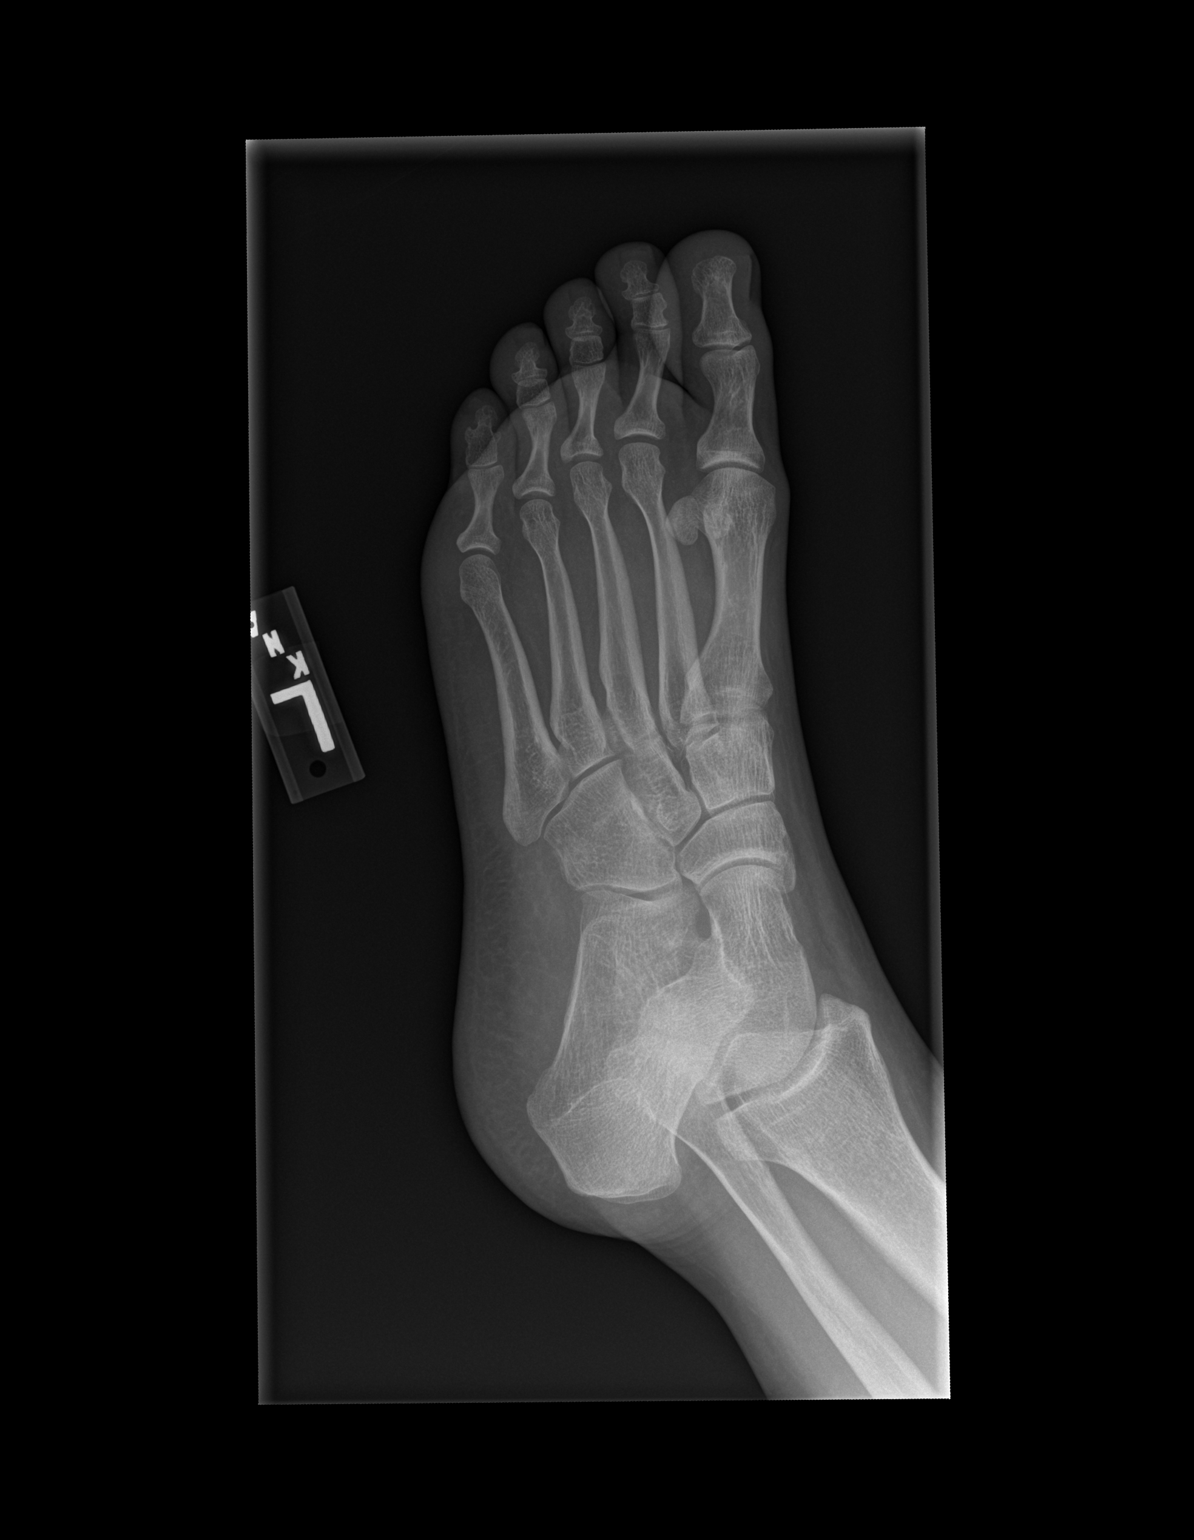

[x foot lat left]
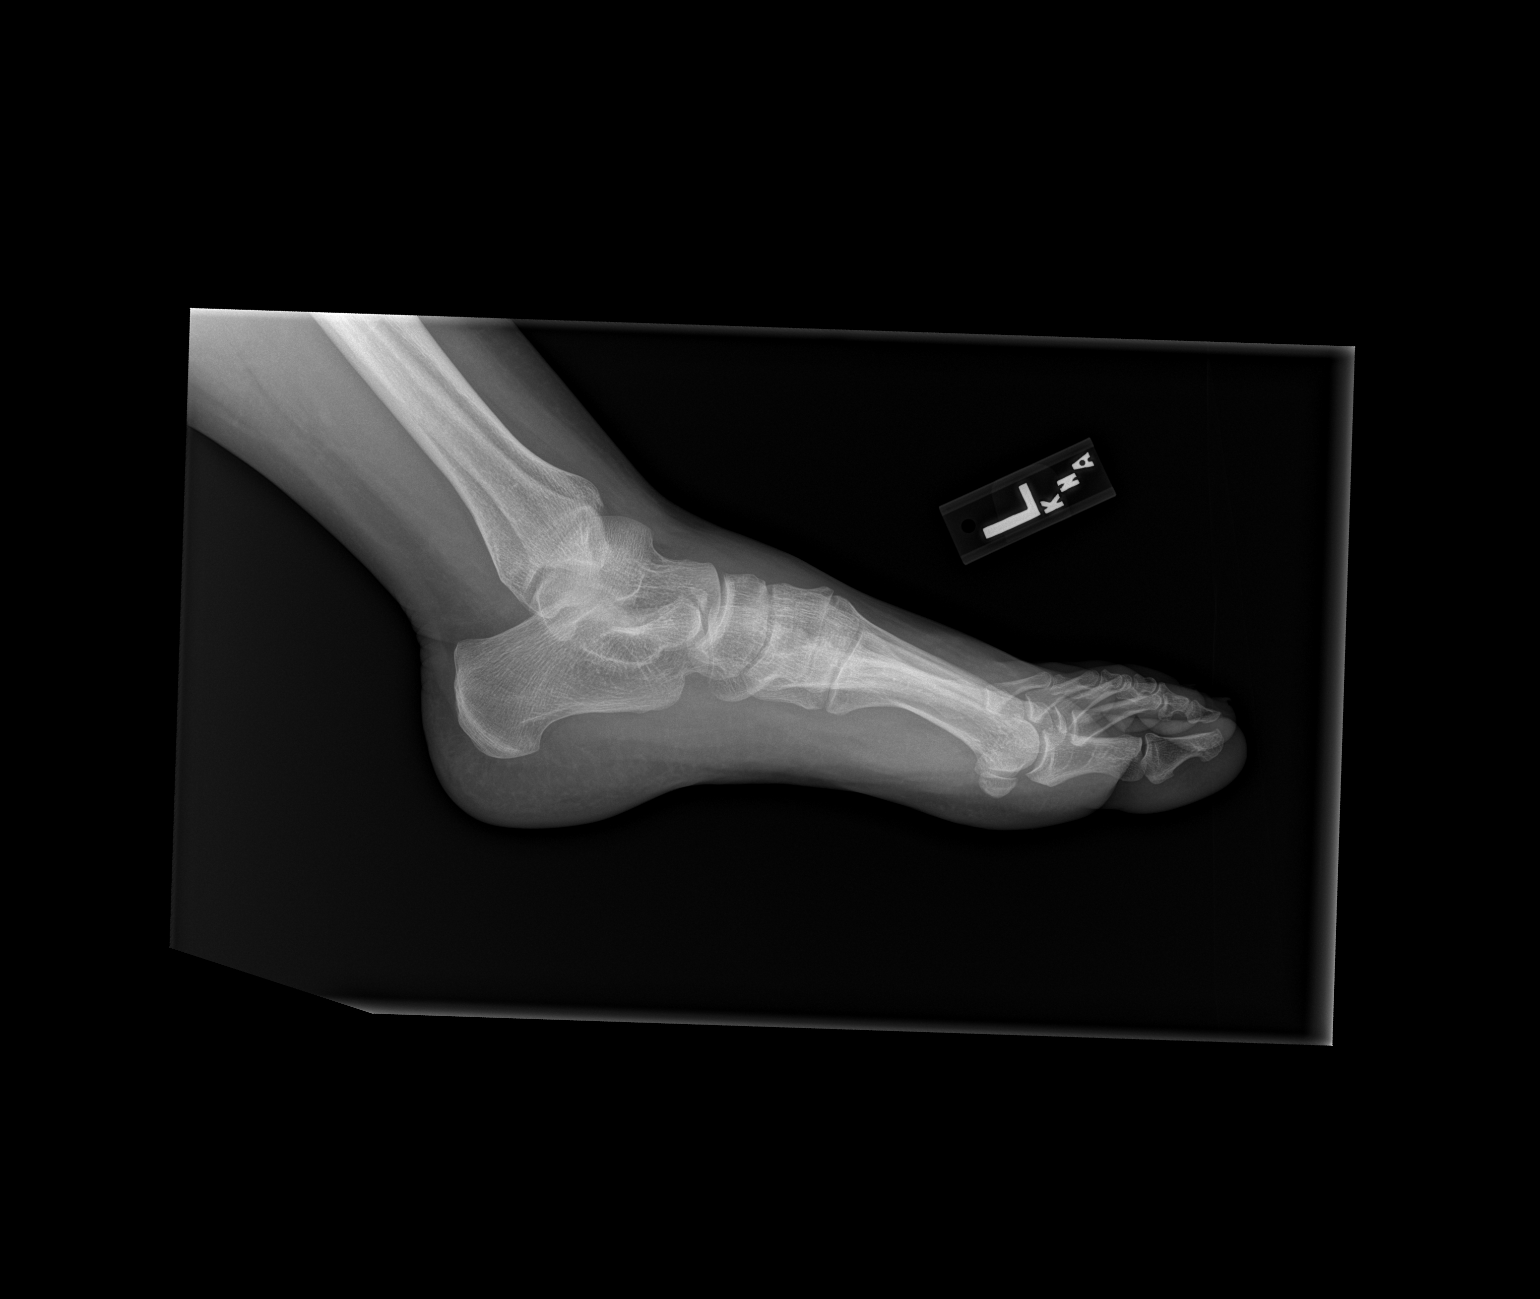

[3 of 3 positions shown; findings below may reference images not displayed]

FINDINGS: There is no ankle fracture or dislocation. There is soft tissue
swelling over the lateral foot, with suspected lateral avulsion
fracture of the cuboid. The talus, calcaneus, and metatarsals appear
intact.
IMPRESSION: Suspected lateral avulsion of the cuboid. Soft tissue swelling. No
ankle fracture.

## 2018-09-08 IMAGING — DX LEFT ANKLE COMPLETE - 3+ VIEW
3 series · 3 of 3 positions shown · non-contrast
Comparison: None.

CLINICAL DATA: Foot pain.

EXAM:
LEFT FOOT - COMPLETE 3+ VIEW; LEFT ANKLE COMPLETE - 3+ VIEW

[x ankle ap left]
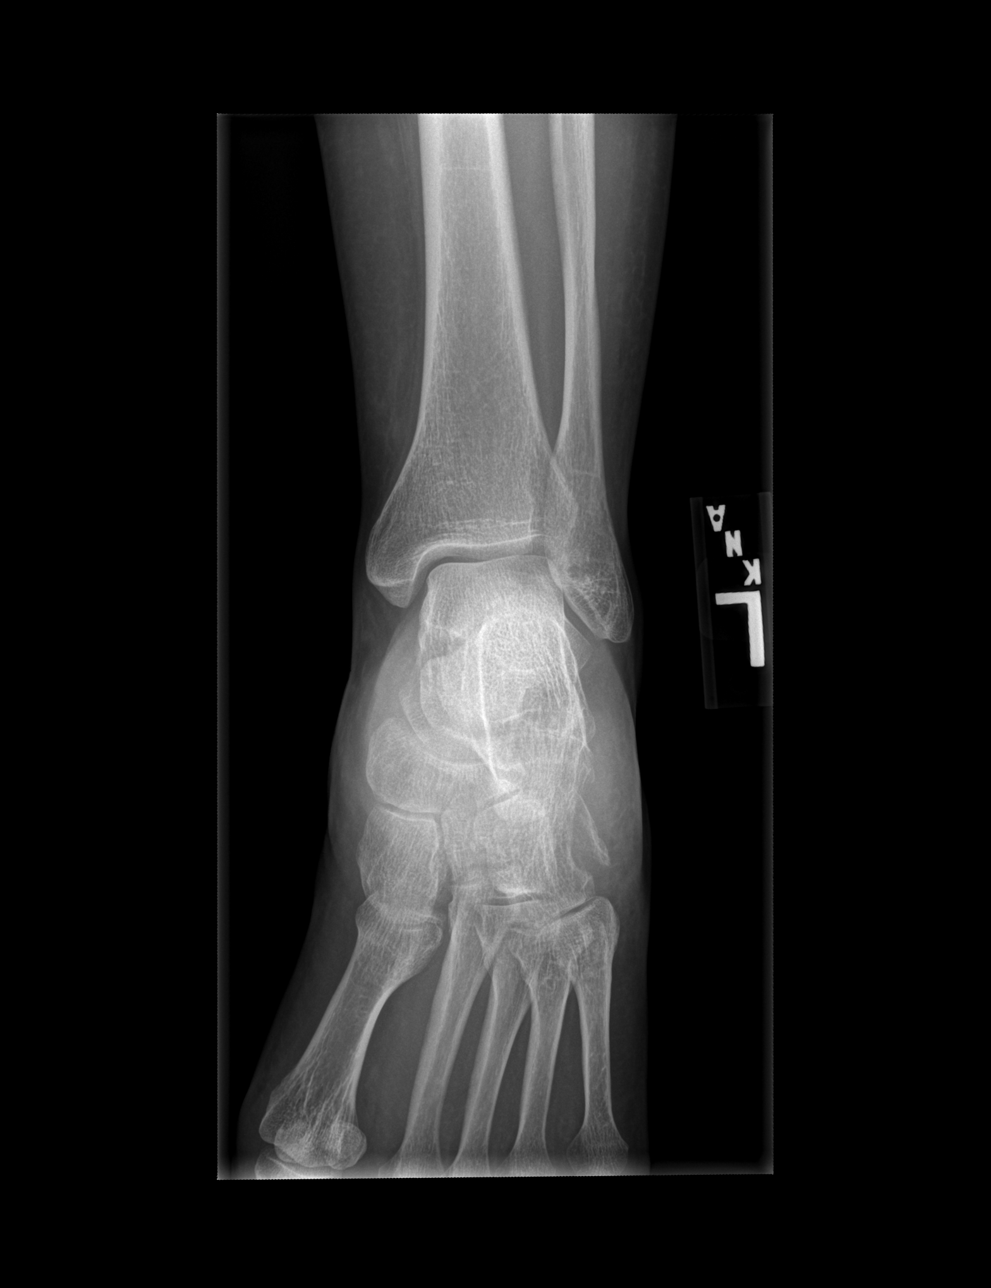

[x ankle obl left]
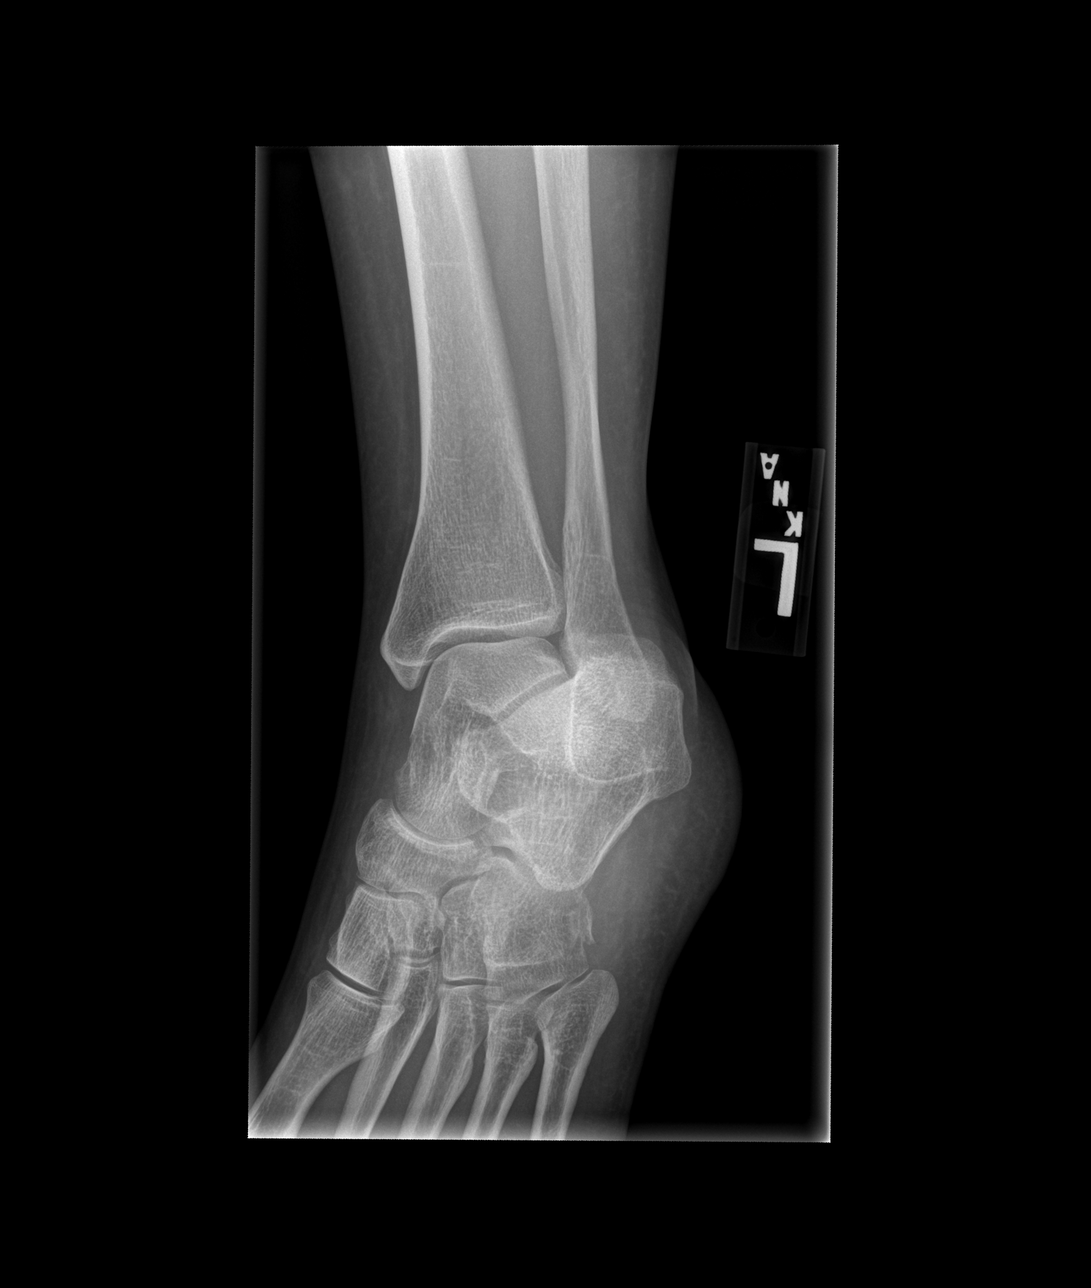

[x ankle lat left]
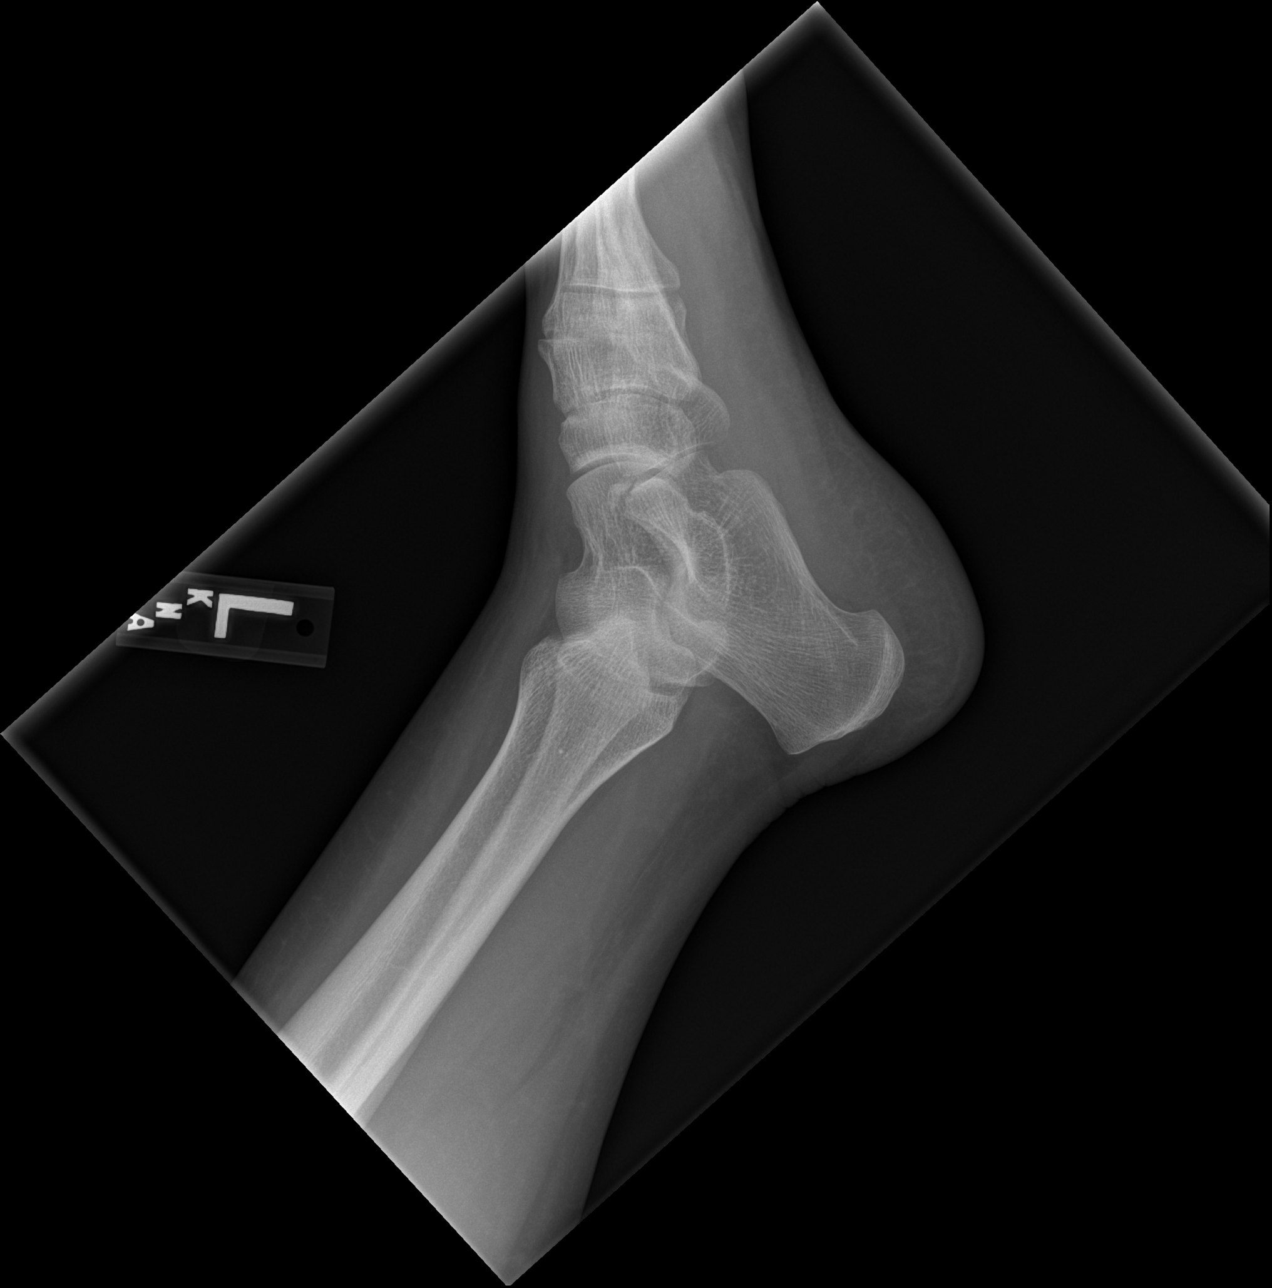

[3 of 3 positions shown; findings below may reference images not displayed]

FINDINGS: There is no ankle fracture or dislocation. There is soft tissue
swelling over the lateral foot, with suspected lateral avulsion
fracture of the cuboid. The talus, calcaneus, and metatarsals appear
intact.
IMPRESSION: Suspected lateral avulsion of the cuboid. Soft tissue swelling. No
ankle fracture.

## 2018-09-08 MED ORDER — HYDROCODONE-ACETAMINOPHEN 5-325 MG PO TABS
1.0000 | ORAL_TABLET | Freq: Once | ORAL | Status: AC
  Administered 2018-09-08: 1 via ORAL
  Filled 2018-09-08: qty 1

## 2018-09-08 MED ORDER — HYDROCODONE-ACETAMINOPHEN 5-325 MG PO TABS: 1.0000 | ORAL_TABLET | Freq: Four times a day (QID) | ORAL | 0 refills | Status: DC | PRN

## 2018-09-08 NOTE — ED Notes (Signed)
Patient verbalizes understanding of discharge instructions. Opportunity for questioning and answers were provided. Armband removed by staff, pt discharged from ED wheelchair to home.  
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
## 2018-09-08 NOTE — Discharge Instructions (Addendum)
Use the brace and crutches to help with pain control. Use Tylenol and ibuprofen as needed for mild to moderate pain.  Use Norco as needed for severe breakthrough pain.  Have caution, this is a narcotic pain medicine.  Do not drive or operate heavy machinery while taking this medication. Keep your foot elevated when able over the next couple days. Call Dr. Magdalene Patricia office tomorrow to set up a follow-up appointment. Return to the emergency room with any new, worsening, concerning symptoms.

## 2018-09-08 NOTE — ED Provider Notes (Addendum)
[facility]   CSN: [id number] Arrival date & time: 09/08/18  1627    History   Chief Complaint Chief Complaint  Patient presents with  . Foot Injury    HPI Ann Brown is a 33 y.o. female presenting for evaluation of left foot injury.  Patient states around 4:00 this afternoon she was walking in heels when she actually stepped off a curb, and felt her left ankle invert.  She had acute onset pain.  She reports hearing a pop.  She has not been able to ambulate since due to pain.  She denies numbness or tingling.  Denies injury elsewhere.  Denies hitting her head or loss of consciousness.  She has not taken anything for pain.  Movement, palpation, and weightbearing makes the pain worse, nothing makes it better.     HPI  Past Medical History:  Diagnosis Date  . Asthma   . Breast mass    fibroadenomas -   . GERD (gastroesophageal reflux disease)   . Kidney problem    Kideney reflux  . Tobacco use   . URI 03/01/2009    Patient Active Problem List   Diagnosis Date Noted  . Rash and nonspecific skin eruption 05/28/2017  . Skin lesion 05/28/2017  . Abnormal mammogram 01/11/2017  . Contusion of left ankle 10/29/2016  . Mild persistent asthma without complication 08/17/2016  . Suicidal ideation   . Chronic lower back pain 09/24/2014  . History of thyroid disease 02/24/2013  . ASCUS on Pap smear 02/01/2013  . TOBACCO USER 04/25/2009    Past Surgical History:  Procedure Laterality Date  . BOWEL RESECTION    . CHOLECYSTECTOMY    . Complex trunk closure  2005  . CSF SHUNT  1987  . TRACHEOSTOMY  1987     OB History    Gravida  3   Para  2   Term  2   Preterm      AB  1   Living  2     SAB      TAB      Ectopic      Multiple      Live Births  2            Home Medications    Prior to Admission medications   Medication Sig Start Date End Date Taking? Authorizing Provider  acetaminophen  (TYLENOL) 500 MG tablet Take 1 tablet (500 mg total) by mouth every 6 (six) hours as needed. 10/07/16   Law, Waylan BogaAlexandra M, PA-C  albuterol (PROVENTIL HFA;VENTOLIN HFA) 108 (90 Base) MCG/ACT inhaler Inhale 2 puffs into the lungs every 6 (six) hours as needed for wheezing. 01/16/17   Howard PouchFeng, Lauren, MD  albuterol (VENTOLIN HFA) 108 (90 Base) MCG/ACT inhaler Inhale 2 puffs into the lungs every 6 (six) hours as needed for wheezing. 05/27/17   Howard PouchFeng, Lauren, MD  baclofen (LIORESAL) 10 MG tablet Take 1 tablet (10 mg total) by mouth 3 (three) times daily. 05/27/17   Howard PouchFeng, Lauren, MD  buPROPion [facility](WELLBUTRIN SR) 100 MG 12 hr tablet take 1 tablet by oral route every morning 07/10/17   [provider]  esomeprazole (NEXIUM) 40 MG capsule TAKE 1 CAPSULE BY MOUTH EVERY DAY 12/31/16   Howard PouchFeng, Lauren, MD  Fluticasone-Salmeterol (ADVAIR) 100-50 MCG/DOSE AEPB Inhale 1 puff into the lungs 2 (two) times daily. 05/27/17   Howard PouchFeng, Lauren, MD  HYDROcodone-acetaminophen (NORCO/VICODIN) 5-325 MG tablet Take 1 tablet by mouth every 6 (  six) hours as needed for severe pain. 09/08/18   Eran Mistry, PA-C  hydrOXYzine (ATARAX/VISTARIL) 10 MG tablet Take one (1) tablet by mouth at bedtime, as needed for insomnia 07/10/17   [provider]  ibuprofen (ADVIL,MOTRIN) 800 MG tablet Take 1 tablet (800 mg total) by mouth 3 (three) times daily. 10/07/16   Law, Waylan Boga, PA-C  methocarbamol (ROBAXIN) 500 MG tablet Take 1 tablet (500 mg total) by mouth every 8 (eight) hours as needed for muscle spasms. 07/12/17   [name], DO  methylPREDNISolone (MEDROL DOSEPAK) 4 MG TBPK tablet Take by mouth as directed. Take 6 tablets on the first day prescribed then as directed. 07/12/17   [name], DO  nicotine (NICODERM CQ - DOSED IN MG/24 HOURS) 21 mg/24hr patch Place 1 patch (21 mg total) onto the skin daily. [facility] [phone] Patient not taking: Reported on 07/12/2017 07/10/13   Moses Manners, MD  norethindrone (ORTHO  MICRONOR) 0.35 MG tablet Take 1 tablet (0.35 mg total) by mouth daily. 02/22/16   Araceli Bouche, DO    Family History Family History  Problem Relation Age of Onset  . Multiple sclerosis Mother   . Cervical cancer Mother   . Breast cancer Maternal Grandmother        unsure of age    Social History Social History   Tobacco Use  . Smoking status: Current Every Day Smoker    Packs/day: 1.00    Types: Cigarettes, E-cigarettes  . Smokeless tobacco: Never Used  . Tobacco comment: Also uses ecigarette  Substance Use Topics  . Alcohol use: No    Alcohol/week: 0.0 standard drinks  . Drug use: No     Allergies   Patient has no known allergies.   Review of Systems Review of Systems  Musculoskeletal: Positive for arthralgias and gait problem.  Hematological: Does not bruise/bleed easily.  All other systems reviewed and are negative.    Physical Exam Updated Vital Signs BP 129/80 (BP Location: Left Arm)   Pulse 98   Temp 98 F (36.7 C) (Oral)   Resp 18   SpO2 100%   Physical Exam Vitals signs and nursing note reviewed.  Constitutional:      General: She is not in acute distress.    Appearance: She is well-developed.  HENT:     Head: Normocephalic and atraumatic.  Neck:     Musculoskeletal: Normal range of motion.  Pulmonary:     Effort: Pulmonary effort is normal.  Abdominal:     General: There is no distension.  Musculoskeletal:        General: Swelling and tenderness present.     Comments: Mild swelling of the dorsal left foot.  Tenderness palpation of the dorsal foot.  Pedal pulse intact.  Minimal to no tenderness palpation over the medial and lateral malleolus.  Achilles tendon palpable and intact.  Decreased range of motion due to pain.  Good cap refill.  Sensation intact.  Skin:    General: Skin is warm.     Capillary Refill: Capillary refill takes less than 2 seconds.     Findings: No rash.  Neurological:     Mental Status: She is alert and oriented  to person, place, and time.      ED Treatments / Results  Labs (all labs ordered are listed, but only abnormal results are displayed) Labs Reviewed - No data to display  EKG None  Radiology Dg Ankle Complete Left  Result Date: 09/08/2018 CLINICAL  DATA:  Foot pain. EXAM: LEFT FOOT - COMPLETE 3+ VIEW; LEFT ANKLE COMPLETE - 3+ VIEW COMPARISON:  None. FINDINGS: There is no ankle fracture or dislocation. There is soft tissue swelling over the lateral foot, with suspected lateral avulsion fracture of the cuboid. The talus, calcaneus, and metatarsals appear intact. IMPRESSION: Suspected lateral avulsion of the cuboid. Soft tissue swelling. No ankle fracture. Electronically Signed   By: Elsie Stain M.D.   On: 09/08/2018 18:35   Dg Foot Complete Left  Result Date: 09/08/2018 CLINICAL DATA:  Foot pain. EXAM: LEFT FOOT - COMPLETE 3+ VIEW; LEFT ANKLE COMPLETE - 3+ VIEW COMPARISON:  None. FINDINGS: There is no ankle fracture or dislocation. There is soft tissue swelling over the lateral foot, with suspected lateral avulsion fracture of the cuboid. The talus, calcaneus, and metatarsals appear intact. IMPRESSION: Suspected lateral avulsion of the cuboid. Soft tissue swelling. No ankle fracture. Electronically Signed   By: Elsie Stain M.D.   On: 09/08/2018 18:35    Procedures Procedures (including critical care time)  Medications Ordered in ED Medications  HYDROcodone-acetaminophen (NORCO/VICODIN) 5-325 MG per tablet 1 tablet (1 tablet Oral Given 09/08/18 1911)     Initial Impression / Assessment and Plan / ED Course  I have reviewed the triage vital signs and the nursing notes.  Pertinent labs & imaging results that were available during my care of the patient were reviewed by me and considered in my medical decision making (see chart for details).        Patient presenting for evaluation of left foot injury.  Physical exam reassuring, she is neurovascularly intact.  X-rays viewed  interpreted by me, no fracture or dislocation.  Per radiologist, appears consistent with ligamentous injury or avulsion.  Patient initially tachycardic, this improved without intervention, likely pain related.  Discussed findings with patient.  Discussed treatment with ASO, crutches, pain control, follow-up with orthopedics.  PMP checked, patient with Adderall prescription, but not concerning for overuse of narcotics.  Will give short course.  Discussed importance of elevation and ice.  At this time, patient appears safe for discharge.  Return precautions given.  Patient states she understands and agrees to plan.   Final Clinical Impressions(s) / ED Diagnoses   Final diagnoses:  Injury of left foot, initial encounter    ED Discharge Orders         Ordered    HYDROcodone-acetaminophen (NORCO/VICODIN) 5-325 MG tablet  Every 6 hours PRN     09/08/18 1906           Alveria Apley, PA-C 09/08/18 1912    Alveria Apley, PA-C 09/08/18 1938    Tegeler, Canary Brim, MD 09/09/18 0020

## 2019-02-06 ENCOUNTER — Ambulatory Visit (INDEPENDENT_AMBULATORY_CARE_PROVIDER_SITE_OTHER): Payer: Self-pay | Admitting: Orthopaedic Surgery

## 2019-02-06 ENCOUNTER — Encounter: Payer: Self-pay | Admitting: Orthopaedic Surgery

## 2019-02-06 ENCOUNTER — Ambulatory Visit (INDEPENDENT_AMBULATORY_CARE_PROVIDER_SITE_OTHER): Payer: Self-pay

## 2019-02-06 ENCOUNTER — Other Ambulatory Visit: Payer: Self-pay

## 2019-02-06 VITALS — Ht 61.0 in | Wt 122.0 lb

## 2019-02-06 DIAGNOSIS — M79672 Pain in left foot: Secondary | ICD-10-CM

## 2019-02-06 NOTE — Progress Notes (Signed)
Office Visit Note   Patient: JAALA BOHLE           Date of Birth: 1985/12/31           MRN: [id number] Visit Date: 02/06/2019              Requested by: Melene Plan, MD [address] [place],  [place] [address] PCP: Melene Plan, MD   Assessment & Plan: Visit Diagnoses:  1. Pain in left foot     Plan: Impression is continue lateral ankle pain status post inversion injury.  Will obtain MRI to rule out structural abnormalities including peroneal tear.  We will see her back after the MRI.  Follow-Up Instructions: Return in about 2 weeks (around 02/20/2019).   Orders:  Orders Placed This Encounter  Procedures  . XR Foot Complete Left   No orders of the defined types were placed in this encounter.     Procedures: No procedures performed   Clinical Data: No additional findings.   Subjective: Chief Complaint  Patient presents with  . Left Foot - Pain    Injury May 2020    Lucella is a 33 year old female who I have seen in the past who comes in for evaluation of a new injury to her left foot ankle.  This is a second opinion per the.  She originally sustained an avulsion fracture of her left cuboid avulsion fracture.  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  Originally she had a lot of swelling and bruising which have resolved.  She has been followed by Dr. Carola Frost during this time.  She will wear a cam boot occasionally when it is more symptomatic.   Review of Systems  Constitutional: Negative.   HENT: Negative.   Eyes: Negative.   Respiratory: Negative.   Cardiovascular: Negative.   Endocrine: Negative.   Musculoskeletal: Negative.   Neurological: Negative.   Hematological: Negative.   Psychiatric/Behavioral: Negative.   All other systems reviewed and are negative.    Objective: Vital Signs: Ht 5\' 1"  (1.549 m)   Wt 122 lb (55.3 kg)   LMP 01/07/2019   BMI 23.05 kg/m   Physical Exam  Vitals signs and nursing note reviewed.  Constitutional:      Appearance: She is well-developed.  Pulmonary:     Effort: Pulmonary effort is normal.  Skin:    General: Skin is warm.     Capillary Refill: Capillary refill takes less than 2 seconds.  Neurological:     Mental Status: She is alert and oriented to person, place, and time.  Psychiatric:        Behavior: Behavior normal.        Thought Content: Thought content normal.        Judgment: Judgment normal.     Ortho Exam Left foot and ankle exam shows no bruising or swelling.  She has tenderness along the peroneal tendons.  Fifth metatarsal is nontender.  Lateral ankle ligaments are not overly tender either.  Achilles insertion is nontender. Specialty Comments:  No specialty comments available.  Imaging: Xr Foot Complete Left  Result Date: 02/06/2019 No acute or structural abnormalities.    PMFS History: Patient Active Problem List   Diagnosis Date Noted  . Rash and nonspecific skin eruption 05/28/2017  . Skin lesion 05/28/2017  . Abnormal mammogram 01/11/2017  . Contusion of left ankle 10/29/2016  . Mild persistent asthma without complication 08/17/2016  . Suicidal  ideation   . Chronic lower back pain 09/24/2014  . History of thyroid disease 02/24/2013  . ASCUS on Pap smear 02/01/2013  . TOBACCO USER 04/25/2009   Past Medical History:  Diagnosis Date  . Asthma   . Breast mass    fibroadenomas -   . GERD (gastroesophageal reflux disease)   . Kidney problem    Kideney reflux  . Tobacco use   . URI 03/01/2009    Family History  Problem Relation Age of Onset  . Multiple sclerosis Mother   . Cervical cancer Mother   . Breast cancer Maternal Grandmother        unsure of age    Past Surgical History:  Procedure Laterality Date  . BOWEL RESECTION    . CHOLECYSTECTOMY    . Complex trunk closure  2005  . CSF SHUNT  1987  . TRACHEOSTOMY  1987   Social History   Occupational History  . Not on file   Tobacco Use  . Smoking status: Current Every Day Smoker    Packs/day: 1.00    Types: Cigarettes, E-cigarettes  . Smokeless tobacco: Never Used  . Tobacco comment: Also uses ecigarette  Substance and Sexual Activity  . Alcohol use: No    Alcohol/week: 0.0 standard drinks  . Drug use: No  . Sexual activity: Yes    Birth control/protection: None

## 2019-02-21 ENCOUNTER — Other Ambulatory Visit: Payer: Self-pay

## 2019-02-21 ENCOUNTER — Ambulatory Visit
Admission: RE | Admit: 2019-02-21 | Discharge: 2019-02-21 | Disposition: A | Discharge status: Home / Self Care | Payer: No Typology Code available for payment source | Source: Ambulatory Visit | Attending: Orthopaedic Surgery | Admitting: Orthopaedic Surgery

## 2019-02-21 DIAGNOSIS — M79672 Pain in left foot: Secondary | ICD-10-CM

## 2019-02-21 IMAGING — MR MR ANKLE*L* W/O CM
4 of 6 series · 13 of 40 positions shown · non-contrast
Comparison: Radiographs [DATE]

CLINICAL DATA: Fell while walking in high heels and injured ankle
in [DATE]. Persistent left ankle swelling.

EXAM:
MRI OF THE LEFT ANKLE WITHOUT CONTRAST
TECHNIQUE: Multiplanar, multisequence MR imaging of the ankle was performed. No
intravenous contrast was administered.

[Series 4: PD fat-sat · axial · left · 3.0mm · 0.25mm/px · z∈[-82,+13]mm · 4 of 30 slices shown]
[im 1/30]
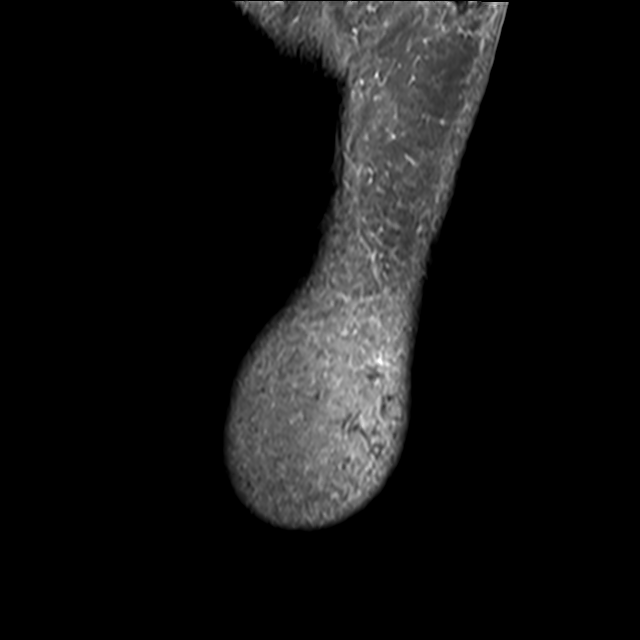
[im 5/30]
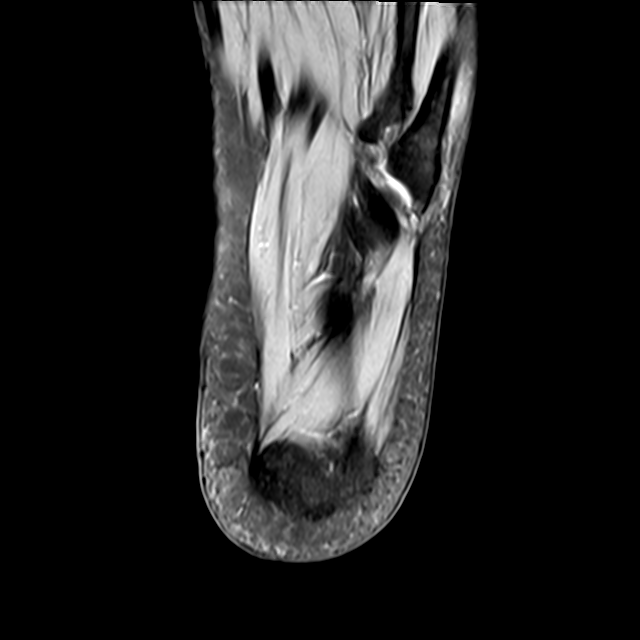
[im 15/30]
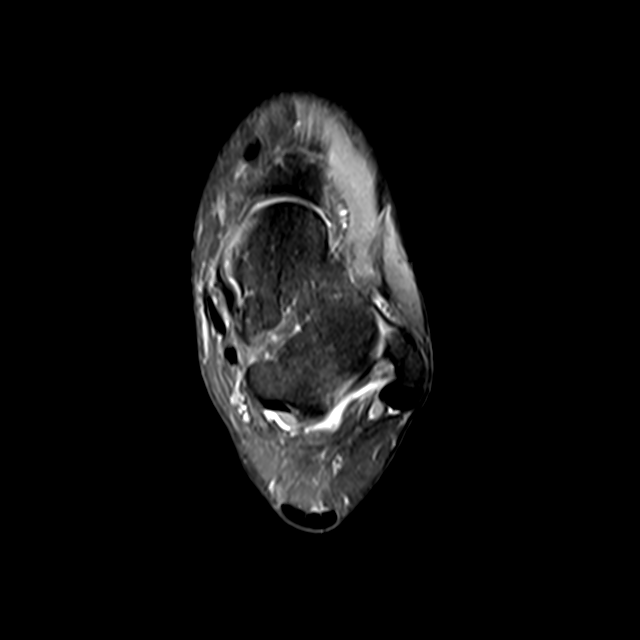
[im 25/30]
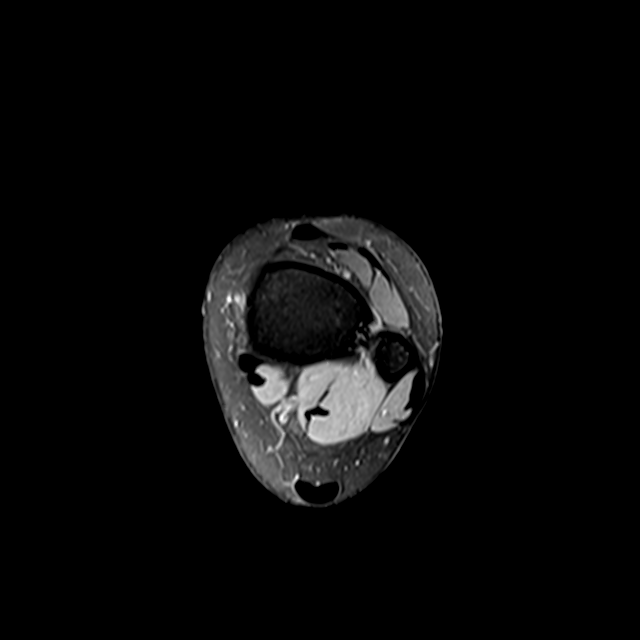

[Series 5: T2 fat-sat · axial · left · 3.0mm · 0.25mm/px · z∈[-66,+13]mm · 3 of 30 slices shown (1 of 2)]
[im 5/30]
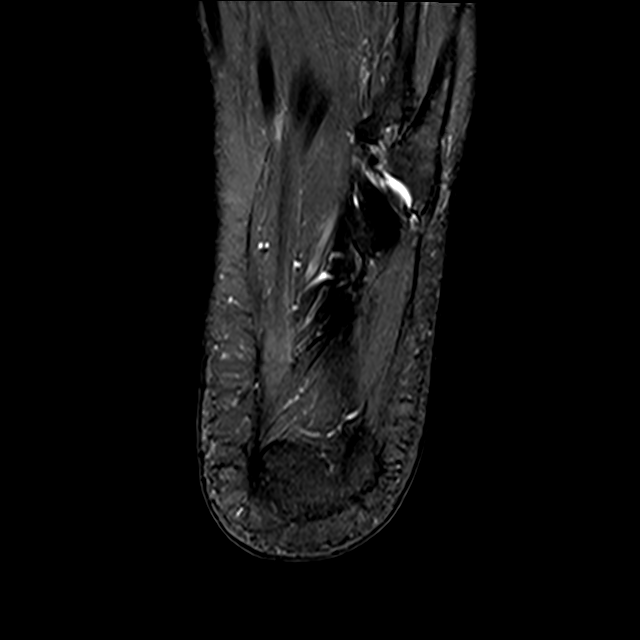
[im 15/30]
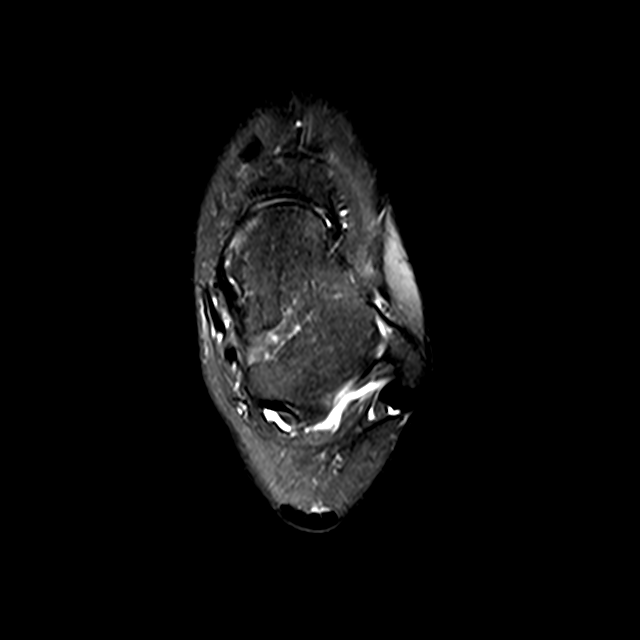
[im 25/30]
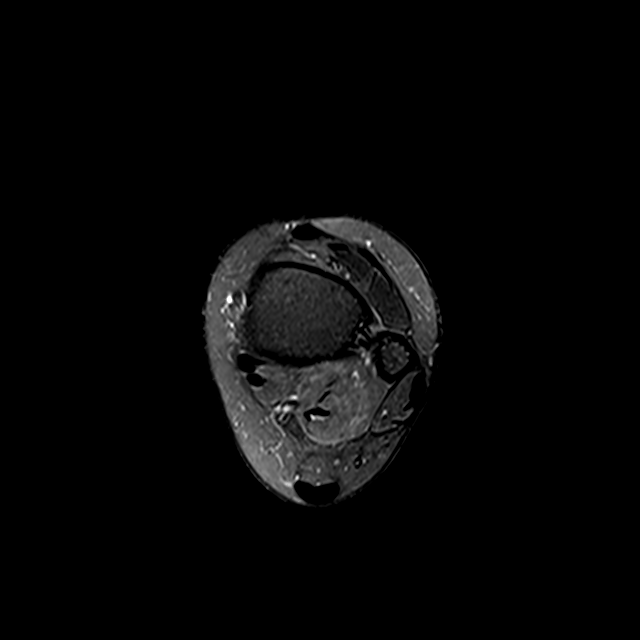

[Series 6: T1 · sagittal · left · 4.0mm · 0.27mm/px · 3 of 18 slices shown]
[im 1/18]
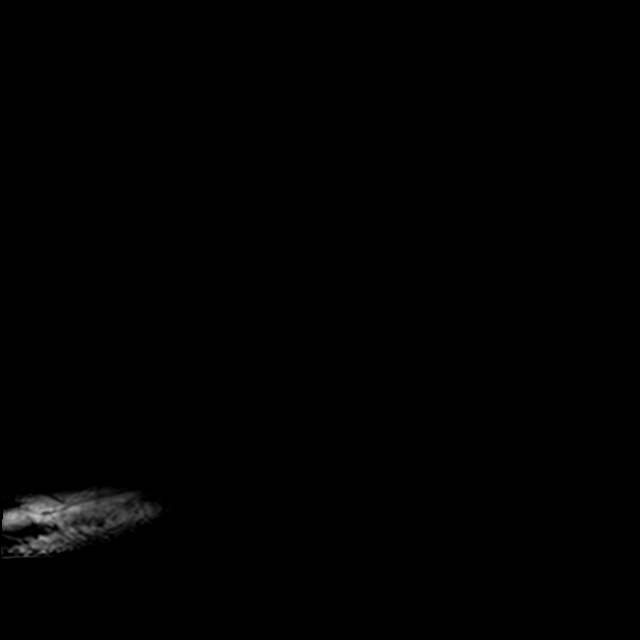
[im 9/18]
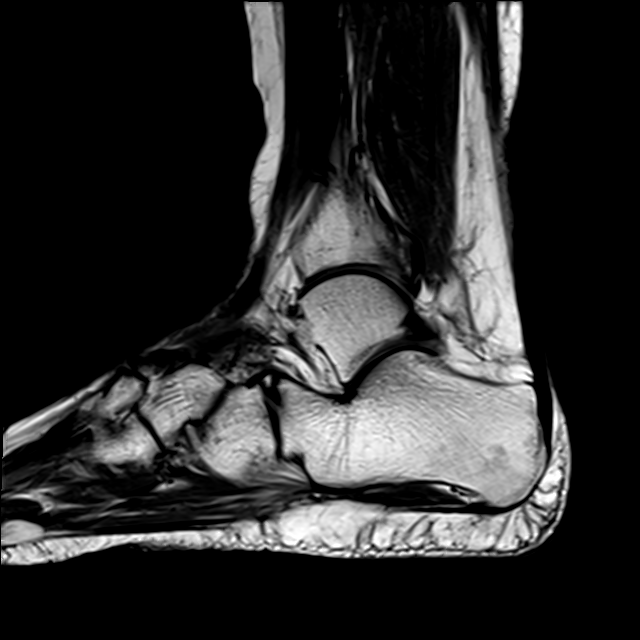
[im 18/18]
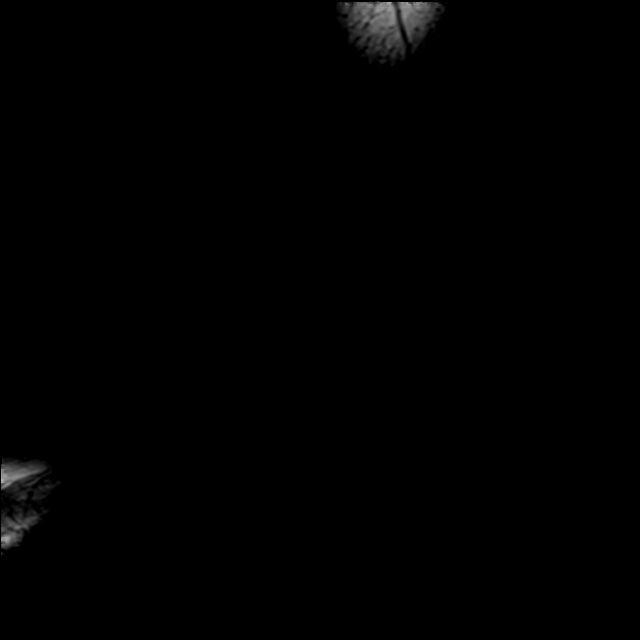

[Series 8: T2 fat-sat · coronal · left · 3.0mm · 0.25mm/px · 3 of 33 slices shown (2 of 2)]
[im 5/33]
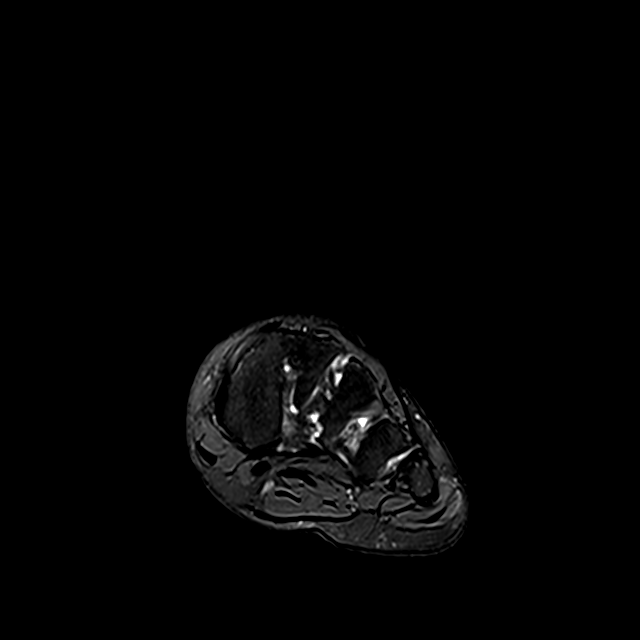
[im 17/33]
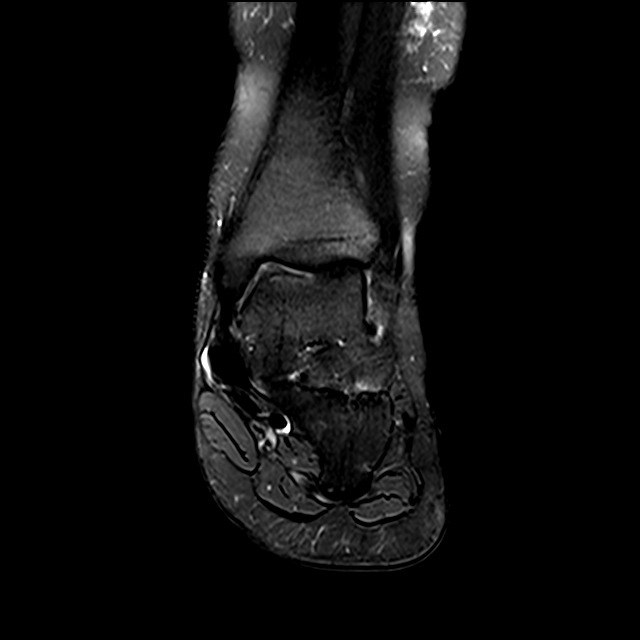
[im 29/33]
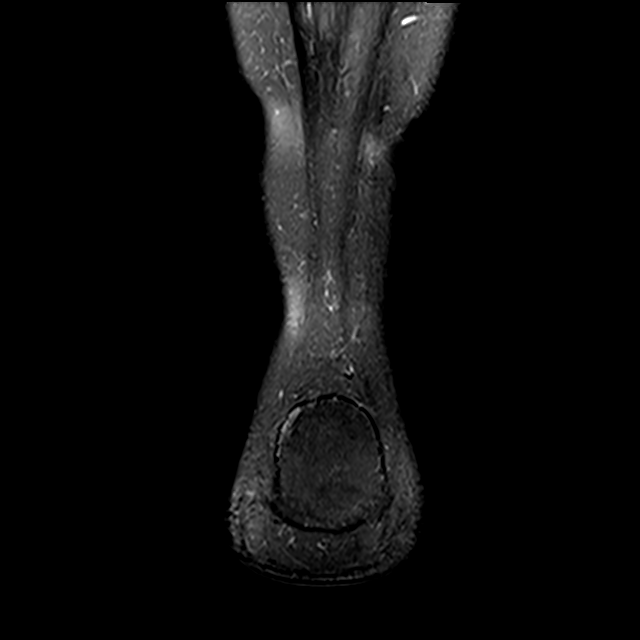

[13 of 40 positions shown; findings below may reference images not displayed]

FINDINGS: TENDONS

Peroneal: Intact

Posteromedial: Intact. Mild tenosynovitis involving the posterior
tibialis tendon.

Anterior: Intact.

Achilles: Normal

Plantar Fascia: Intact

LIGAMENTS

Lateral: Intact

Medial: Intact

CARTILAGE

Ankle Joint: Intact articular cartilage. No significant degenerative
changes or osteochondral lesion. No joint effusion.

Subtalar Joints/Sinus Tarsi: The subtalar joints are maintained. The
sinus tarsi is normal. The cervical and interosseous ligaments are
intact. The spring ligament is intact.

Bones: Remote lateral avulsion type fracture involving the cuboid
with incomplete healing. There is a small fluid-filled gap
anteriorly.

There is also an ununited fracture involving the anterior process of
the calcaneus.

No acute bony findings, osteochondral lesion or AVN.

Moderate pes planus noted.

Other: Unremarkable appearance of the foot and ankle musculature.
IMPRESSION: 1. Remote partially ununited avulsion fracture of the lateral
cuboid.
2. Ununited anterior process fracture of the calcaneus.
3. No acute bony findings, stress fracture or AVN.
4. Intact medial and lateral ankle ligaments and medial, lateral and
anterior ankle tendons.

## 2019-02-24 ENCOUNTER — Ambulatory Visit (INDEPENDENT_AMBULATORY_CARE_PROVIDER_SITE_OTHER): Payer: Self-pay | Admitting: Orthopaedic Surgery

## 2019-02-24 ENCOUNTER — Encounter: Payer: Self-pay | Admitting: Orthopaedic Surgery

## 2019-02-24 ENCOUNTER — Other Ambulatory Visit: Payer: Self-pay

## 2019-02-24 DIAGNOSIS — M79672 Pain in left foot: Secondary | ICD-10-CM

## 2019-02-24 MED ORDER — DICLOFENAC SODIUM 75 MG PO TBEC: 75.0000 mg | DELAYED_RELEASE_TABLET | Freq: Two times a day (BID) | ORAL | 2 refills | Status: DC

## 2019-02-24 MED ORDER — PREDNISONE 10 MG (21) PO TBPK: ORAL_TABLET | ORAL | 0 refills | Status: DC

## 2019-02-24 NOTE — Progress Notes (Signed)
Office Visit Note   Patient: Ann Brown           Date of Birth: [birth year]           MRN: [id number] Visit Date: 02/24/2019              Requested by: Wilber Oliphant, MD [phone],  [place] [address] PCP: Wilber Oliphant, MD   Assessment & Plan: Visit Diagnoses:  1. Pain in left foot     Plan: MRI of the left foot shows edema of the lateral aspect of the cuboid.  This is likely secondary to her previous avulsion fracture of the cuboid that she had back in May.  Based on discussion we will try prednisone Dosepak followed by diclofenac.  She can use the cam boot as needed.  If she fails to improve we may need to refer her to Dr. Sharol Given for further evaluation and treatment.  Follow-Up Instructions: Return if symptoms worsen or fail to improve.   Orders:  No orders of the defined types were placed in this encounter.  Meds ordered this encounter  Medications  . predniSONE (STERAPRED UNI-PAK 21 TAB) 10 MG (21) TBPK tablet    Sig: Take as directed    Dispense:  21 tablet    Refill:  0  . diclofenac (VOLTAREN) 75 MG EC tablet    Sig: Take 1 tablet (75 mg total) by mouth 2 (two) times daily.    Dispense:  30 tablet    Refill:  2      Procedures: No procedures performed   Clinical Data: No additional findings.   Subjective: Chief Complaint  Patient presents with  . Follow-up    Ann Brown is here today for MRI review of her left foot.  Denies any changes in her symptoms.  She continues to endorse pain on the lateral aspect of her foot that radiates into her toes.  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   Review of Systems  Constitutional: Negative.   HENT: Negative.   Eyes: Negative.   Respiratory: Negative.   Cardiovascular: Negative.   Endocrine: Negative.   Musculoskeletal: Negative.   Neurological: Negative.   Hematological: Negative.   Psychiatric/Behavioral: Negative.   All other systems reviewed and are negative.     Objective: Vital Signs: There were no vitals taken for this visit.  Physical Exam Vitals signs and nursing note reviewed.  Constitutional:      Appearance: She is well-developed.  Pulmonary:     Effort: Pulmonary effort is normal.  Skin:    General: Skin is warm.     Capillary Refill: Capillary refill takes less than 2 seconds.  Neurological:     Mental Status: She is alert and oriented to person, place, and time.  Psychiatric:        Behavior: Behavior normal.        Thought Content: Thought content normal.        Judgment: Judgment normal.     Ortho Exam Left foot exam shows only mild tenderness along the lateral aspect of the calcaneus and cuboid.  No withdrawing to pain.  No swelling. Specialty Comments:  No specialty comments available.  Imaging: No results found.   PMFS History: Patient Active Problem List   Diagnosis Date Noted  . Rash and nonspecific skin eruption 05/28/2017  . Skin lesion 05/28/2017  . Abnormal mammogram 01/11/2017  . Contusion of left ankle 10/29/2016  .  Mild persistent asthma without complication 08/17/2016  . Suicidal ideation   . Chronic lower back pain 09/24/2014  . History of thyroid disease 02/24/2013  . ASCUS on Pap smear 02/01/2013  . TOBACCO USER 04/25/2009   Past Medical History:  Diagnosis Date  . Asthma   . Breast mass    fibroadenomas -   . GERD (gastroesophageal reflux disease)   . Kidney problem    Kideney reflux  . Tobacco use   . URI 03/01/2009    Family History  Problem Relation Age of Onset  . Multiple sclerosis Mother   . Cervical cancer Mother   . Breast cancer Maternal Grandmother        unsure of age    Past Surgical History:  Procedure Laterality Date  . BOWEL RESECTION    . CHOLECYSTECTOMY    . Complex trunk closure  2005  . CSF SHUNT  1987  . TRACHEOSTOMY  1987   Social History   Occupational History  . Not on file  Tobacco Use  . Smoking status: Current Every Day Smoker     Packs/day: 1.00    Types: Cigarettes, E-cigarettes  . Smokeless tobacco: Never Used  . Tobacco comment: Also uses ecigarette  Substance and Sexual Activity  . Alcohol use: No    Alcohol/week: 0.0 standard drinks  . Drug use: No  . Sexual activity: Yes    Birth control/protection: None

## 2019-03-03 ENCOUNTER — Encounter (HOSPITAL_BASED_OUTPATIENT_CLINIC_OR_DEPARTMENT_OTHER): Payer: Self-pay | Admitting: *Deleted

## 2019-03-03 ENCOUNTER — Other Ambulatory Visit: Payer: Self-pay

## 2019-03-03 ENCOUNTER — Emergency Department (HOSPITAL_BASED_OUTPATIENT_CLINIC_OR_DEPARTMENT_OTHER)
Admission: EM | Admit: 2019-03-03 | Discharge: 2019-03-03 | Disposition: A | Discharge status: Home / Self Care | Payer: Self-pay | Attending: Emergency Medicine | Admitting: Emergency Medicine

## 2019-03-03 DIAGNOSIS — F17290 Nicotine dependence, other tobacco product, uncomplicated: Secondary | ICD-10-CM | POA: Insufficient documentation

## 2019-03-03 DIAGNOSIS — J45909 Unspecified asthma, uncomplicated: Secondary | ICD-10-CM | POA: Insufficient documentation

## 2019-03-03 DIAGNOSIS — F17210 Nicotine dependence, cigarettes, uncomplicated: Secondary | ICD-10-CM | POA: Insufficient documentation

## 2019-03-03 DIAGNOSIS — K047 Periapical abscess without sinus: Secondary | ICD-10-CM | POA: Insufficient documentation

## 2019-03-03 MED ORDER — NAPROXEN 500 MG PO TABS: 500.0000 mg | ORAL_TABLET | Freq: Two times a day (BID) | ORAL | 0 refills | Status: DC

## 2019-03-03 MED ORDER — PENICILLIN V POTASSIUM 500 MG PO TABS: 500.0000 mg | ORAL_TABLET | Freq: Four times a day (QID) | ORAL | 0 refills | Status: AC

## 2019-03-03 NOTE — ED Provider Notes (Signed)
[facility]PT [facility] Emergency Department Provider Note MRN:  [id number]  Arrival date & time: 03/03/19     Chief Complaint   Dental Pain   History of Present Illness   Ann Brown is a 33 y.o. year-old female with a history of GERD presenting to the ED with chief complaint of dental pain.  1 week of persistent pain to the left lower molar.  Getting worse and worse.  Denies fever.  No other complaints.  Review of Systems  A problem-focused ROS was performed. Positive for tooth pain.  Patient denies fever.  Patient's Health History    Past Medical History:  Diagnosis Date  . Asthma   . Breast mass    fibroadenomas -   . GERD (gastroesophageal reflux disease)   . Kidney problem    Kideney reflux  . Tobacco use   . URI 03/01/2009    Past Surgical History:  Procedure Laterality Date  . BOWEL RESECTION    . CHOLECYSTECTOMY    . Complex trunk closure  2005  . CSF SHUNT  1987  . TRACHEOSTOMY  1987    Family History  Problem Relation Age of Onset  . Multiple sclerosis Mother   . Cervical cancer Mother   . Breast cancer Maternal Grandmother        unsure of age    Social History   Socioeconomic History  . Marital status: Single    Spouse name: Not on file  . Number of children: Not on file  . Years of education: Not on file  . Highest education level: Not on file  Occupational History  . Not on file  Social Needs  . Financial resource strain: Not on file  . Food insecurity    Worry: Not on file    Inability: Not on file  . Transportation needs    Medical: Not on file    Non-medical: Not on file  Tobacco Use  . Smoking status: Current Every Day Smoker    Packs/day: 1.00    Types: Cigarettes, E-cigarettes  . Smokeless tobacco: Never Used  . Tobacco comment: Also uses ecigarette  Substance and Sexual Activity  . Alcohol use: No    Alcohol/week: 0.0 standard drinks  . Drug use: No  . Sexual activity: Yes    Birth  control/protection: None  Lifestyle  . Physical activity    Days per week: Not on file    Minutes per session: Not on file  . Stress: Not on file  Relationships  . Social Musician on phone: Not on file    Gets together: Not on file    Attends religious service: Not on file    Active member of club or organization: Not on file    Attends meetings of clubs or organizations: Not on file    Relationship status: Not on file  . Intimate partner violence    Fear of current or ex partner: Not on file    Emotionally abused: Not on file    Physically abused: Not on file    Forced sexual activity: Not on file  Other Topics Concern  . Not on file  Social History Narrative  . Not on file     Physical Exam  Vital Signs and Nursing Notes reviewed Vitals:   03/03/19 1818  BP: 116/80  Pulse: (!) 109  Resp: 16  Temp: 99 F (37.2 C)  SpO2: 100%    CONSTITUTIONAL: Well-appearing, NAD NEURO:  Alert and oriented x 3, no focal deficits EYES:  eyes equal and reactive ENT/NECK:  no LAD, no JVD; tenderness palpation to the left mandibular molar CARDIO: Regular rate, well-perfused, normal S1 and S2 PULM:  CTAB no wheezing or rhonchi GI/GU:  normal bowel sounds, non-distended, non-tender MSK/SPINE:  No gross deformities, no edema SKIN:  no rash, atraumatic PSYCH:  Appropriate speech and behavior  Diagnostic and Interventional Summary    EKG Interpretation  Date/Time:    Ventricular Rate:    PR Interval:    QRS Duration:   QT Interval:    QTC Calculation:   R Axis:     Text Interpretation:        Labs Reviewed - No data to display  No orders to display    Medications - No data to display   Procedures  /  Critical Care Procedures  ED Course and Medical Decision Making  I have reviewed the triage vital signs and the nursing notes.  Pertinent labs & imaging results that were available during my care of the patient were reviewed by me and considered in my medical  decision making (see below for details).     Reassuring vital signs, well-appearing, no evidence of gingival abscess, seems to be an uncomplicated tooth infection, provided with penicillin, Naprosyn.  Advise dental follow-up.  Barth Kirks. Sedonia Small, MD [place] [email or web address]  Final Clinical Impressions(s) / ED Diagnoses     ICD-10-CM   1. Dental infection  K04.7     ED Discharge Orders         Ordered    penicillin v potassium (VEETID) 500 MG tablet  4 times daily     03/03/19 1901    naproxen (NAPROSYN) 500 MG tablet  2 times daily     03/03/19 1901           Discharge Instructions Discussed with and Provided to Patient:     Discharge Instructions     You were evaluated in the Emergency Department and after careful evaluation, we did not find any emergent condition requiring admission or further testing in the hospital.  Your exam/testing today is overall reassuring.  Your pain seems to be due to a tooth infection.  Please take the penicillin antibiotic as directed.  You can use the Naprosyn anti-inflammatory as needed for pain.  Please return to the Emergency Department if you experience any worsening of your condition.  We encourage you to follow up with a primary care provider.  Thank you for allowing [place] to be a part of your care.      Maudie Flakes, MD 03/03/19 [phone]

## 2019-03-03 NOTE — ED Triage Notes (Signed)
Pt c/o dental pain x 1 week 

## 2019-03-03 NOTE — Discharge Instructions (Signed)
You were evaluated in the Emergency Department and after careful evaluation, we did not find any emergent condition requiring admission or further testing in the hospital.  Your exam/testing today is overall reassuring.  Your pain seems to be due to a tooth infection.  Please take the penicillin antibiotic as directed.  You can use the Naprosyn anti-inflammatory as needed for pain.  Please return to the Emergency Department if you experience any worsening of your condition.  We encourage you to follow up with a primary care provider.  Thank you for allowing [place] to be a part of your care.

## 2019-04-02 ENCOUNTER — Other Ambulatory Visit: Payer: Self-pay

## 2019-04-02 ENCOUNTER — Ambulatory Visit (INDEPENDENT_AMBULATORY_CARE_PROVIDER_SITE_OTHER): Payer: Self-pay | Admitting: Orthopedic Surgery

## 2019-04-02 ENCOUNTER — Encounter: Payer: Self-pay | Admitting: Orthopedic Surgery

## 2019-04-02 VITALS — Ht 61.0 in | Wt 122.0 lb

## 2019-04-02 DIAGNOSIS — M79672 Pain in left foot: Secondary | ICD-10-CM

## 2019-04-03 ENCOUNTER — Encounter: Payer: Self-pay | Admitting: Orthopedic Surgery

## 2019-04-03 NOTE — Progress Notes (Signed)
Office Visit Note   Patient: Ann Brown           Date of Birth: November 01, 1985           MRN: [id number] Visit Date: 04/02/2019              Requested by: Ann Oliphant, MD [address],  [place] [address] PCP: Ann Oliphant, MD  Chief Complaint  Patient presents with  . Left Ankle - Pain, New Patient (Initial Visit)  . Left Foot - Pain, New Patient (Initial Visit)      HPI: Patient is a 33 year old woman who is seen in referral from Dr. Erlinda Brown for evaluation for left foot pain.  Patient is status post an MRI scan of her left ankle and a radiograph as well.  Patient states she broke her foot on 09/08/2018 and has been wearing a cam boot.  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  She states she is still working through the pain and has a pain level of 8/10.  She states she tripped yesterday and landed on her foot.  Radiographs were obtained at [facility].  Patient states she is used a fracture boot she is used prednisone and Voltaren without relief.  Patient has no history of gout or autoimmune disease.  Assessment & Plan: Visit Diagnoses:  1. Pain in left foot     Plan: Recommended vitamin D3 2000 to 5000 international units/day recommended smoking cessation and use the fracture boot as needed follow-up as needed.  There is no surgical intervention that would help the cuboid avulsion fracture there is no ligamentous or tendon disruption.  Follow-Up Instructions: Return if symptoms worsen or fail to improve.   Ortho Exam  Patient is alert, oriented, no adenopathy, well-dressed, normal affect, normal respiratory effort. Examination patient has good pulses she has no dystrophic changes no clinical signs of complex regional pain syndrome.  Her skin color temperature is normal she has no's hypersensitivity to light touch.  She does have tenderness to palpation globally around the foot and ankle.  No focal area of tenderness the area of the  cuboid is not tender to palpation.  She has good inversion eversion plantarflexion and dorsiflexion strength.  Imaging: No results found. No images are attached to the encounter.  Labs: Lab Results  Component Value Date   LABORGA NO GROWTH 03/30/2013     Lab Results  Component Value Date   ALBUMIN 3.8 07/19/2017   ALBUMIN 4.5 07/06/2016   ALBUMIN 4.0 10/13/2014    No results found for: MG No results found for: VD25OH  No results found for: PREALBUMIN CBC EXTENDED Latest Ref Rng & Units 07/19/2017 07/06/2016 10/13/2014  WBC 4.0 - 10.5 K/uL 13.7(H) 10.0 11.4(H)  RBC 3.87 - 5.11 MIL/uL 4.60 4.57 4.39  HGB 12.0 - 15.0 g/dL 16.1(H) 16.0(H) 15.2(H)  HCT 36.0 - 46.0 % 47.7(H) 45.6 44.7  PLT 150 - 400 K/uL 193 224 256  NEUTROABS 1.7 - 7.7 K/uL - - -  LYMPHSABS 0.7 - 4.0 K/uL - - -     Body mass index is 23.05 kg/m.  Orders:  No orders of the defined types were placed in this encounter.  No orders of the defined types were placed in this encounter.    Procedures: No procedures performed  Clinical Data: No additional findings.  ROS:  All other systems negative, except as noted in the HPI. Review of Systems  Objective:  Vital Signs: Ht 5\' 1"  (1.549 m)   Wt 122 lb (55.3 kg)   BMI 23.05 kg/m   Specialty Comments:  No specialty comments available.  PMFS History: Patient Active Problem List   Diagnosis Date Noted  . Rash and nonspecific skin eruption 05/28/2017  . Skin lesion 05/28/2017  . Abnormal mammogram 01/11/2017  . Contusion of left ankle 10/29/2016  . Mild persistent asthma without complication 08/17/2016  . Suicidal ideation   . Chronic lower back pain 09/24/2014  . History of thyroid disease 02/24/2013  . ASCUS on Pap smear 02/01/2013  . TOBACCO USER 04/25/2009   Past Medical History:  Diagnosis Date  . Asthma   . Breast mass    fibroadenomas -   . GERD (gastroesophageal reflux disease)   . Kidney problem    Kideney reflux  . Tobacco use    . URI 03/01/2009    Family History  Problem Relation Age of Onset  . Multiple sclerosis Mother   . Cervical cancer Mother   . Breast cancer Maternal Grandmother        unsure of age    Past Surgical History:  Procedure Laterality Date  . BOWEL RESECTION    . CHOLECYSTECTOMY    . Complex trunk closure  2005  . CSF SHUNT  1987  . TRACHEOSTOMY  1987   Social History   Occupational History  . Not on file  Tobacco Use  . Smoking status: Current Every Day Smoker    Packs/day: 1.00    Types: Cigarettes, E-cigarettes  . Smokeless tobacco: Never Used  . Tobacco comment: Also uses ecigarette  Substance and Sexual Activity  . Alcohol use: No    Alcohol/week: 0.0 standard drinks  . Drug use: No  . Sexual activity: Yes    Birth control/protection: None

## 2019-05-26 ENCOUNTER — Other Ambulatory Visit: Payer: Self-pay

## 2019-05-26 ENCOUNTER — Ambulatory Visit
Admission: EM | Admit: 2019-05-26 | Discharge: 2019-05-26 | Disposition: A | Discharge status: Home / Self Care | Payer: Self-pay

## 2019-05-26 ENCOUNTER — Ambulatory Visit (INDEPENDENT_AMBULATORY_CARE_PROVIDER_SITE_OTHER): Payer: Self-pay

## 2019-05-26 DIAGNOSIS — J4531 Mild persistent asthma with (acute) exacerbation: Secondary | ICD-10-CM

## 2019-05-26 DIAGNOSIS — R0602 Shortness of breath: Secondary | ICD-10-CM

## 2019-05-26 DIAGNOSIS — R059 Cough, unspecified: Secondary | ICD-10-CM

## 2019-05-26 DIAGNOSIS — R05 Cough: Secondary | ICD-10-CM

## 2019-05-26 DIAGNOSIS — F17200 Nicotine dependence, unspecified, uncomplicated: Secondary | ICD-10-CM

## 2019-05-26 DIAGNOSIS — Z20822 Contact with and (suspected) exposure to covid-19: Secondary | ICD-10-CM

## 2019-05-26 IMAGING — DX DG CHEST 2V
2 series · 2 of 2 positions shown · non-contrast
Comparison: [DATE].

CLINICAL DATA: Productive cough, shortness of breath.

EXAM:
CHEST - 2 VIEW

[chest pa]
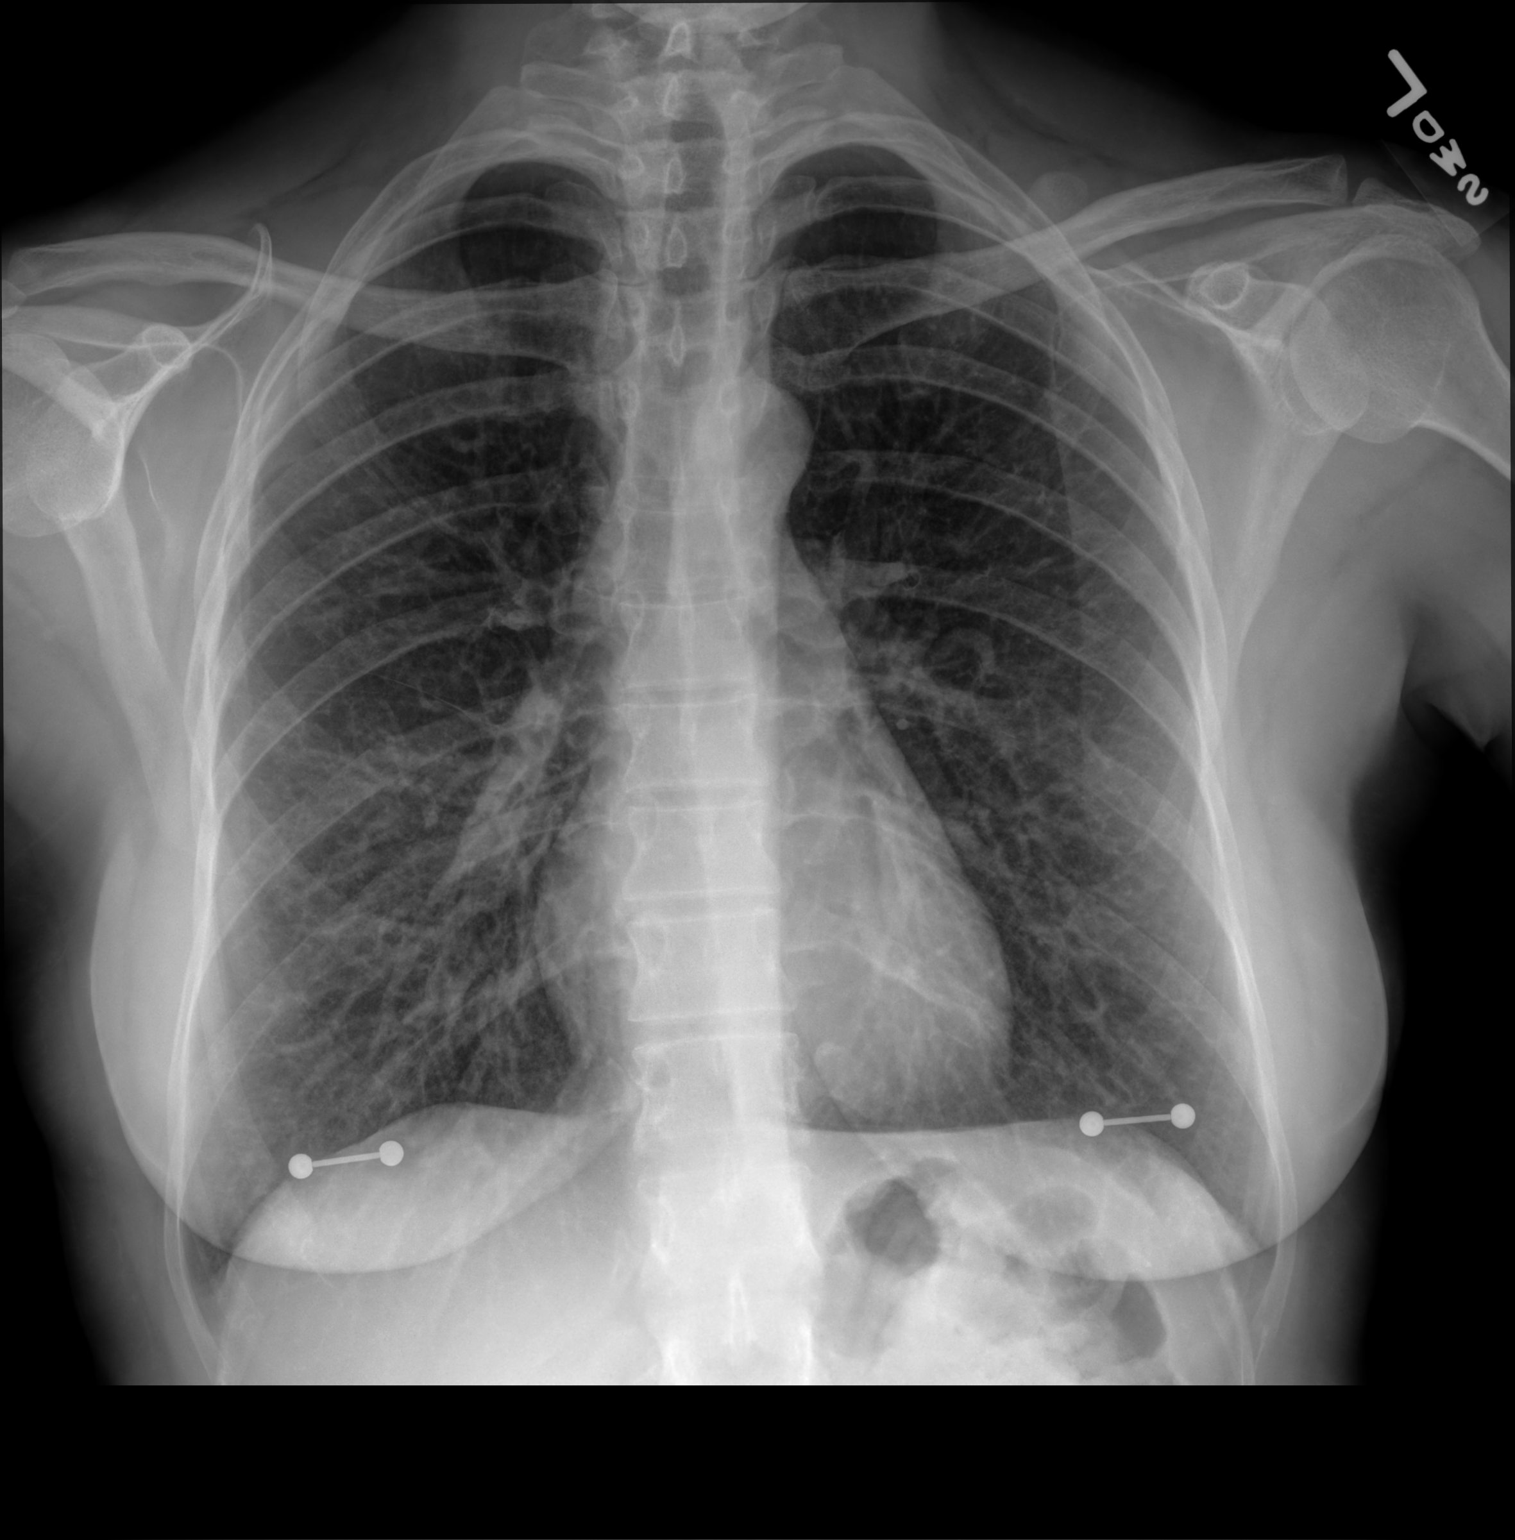

[chest lat]
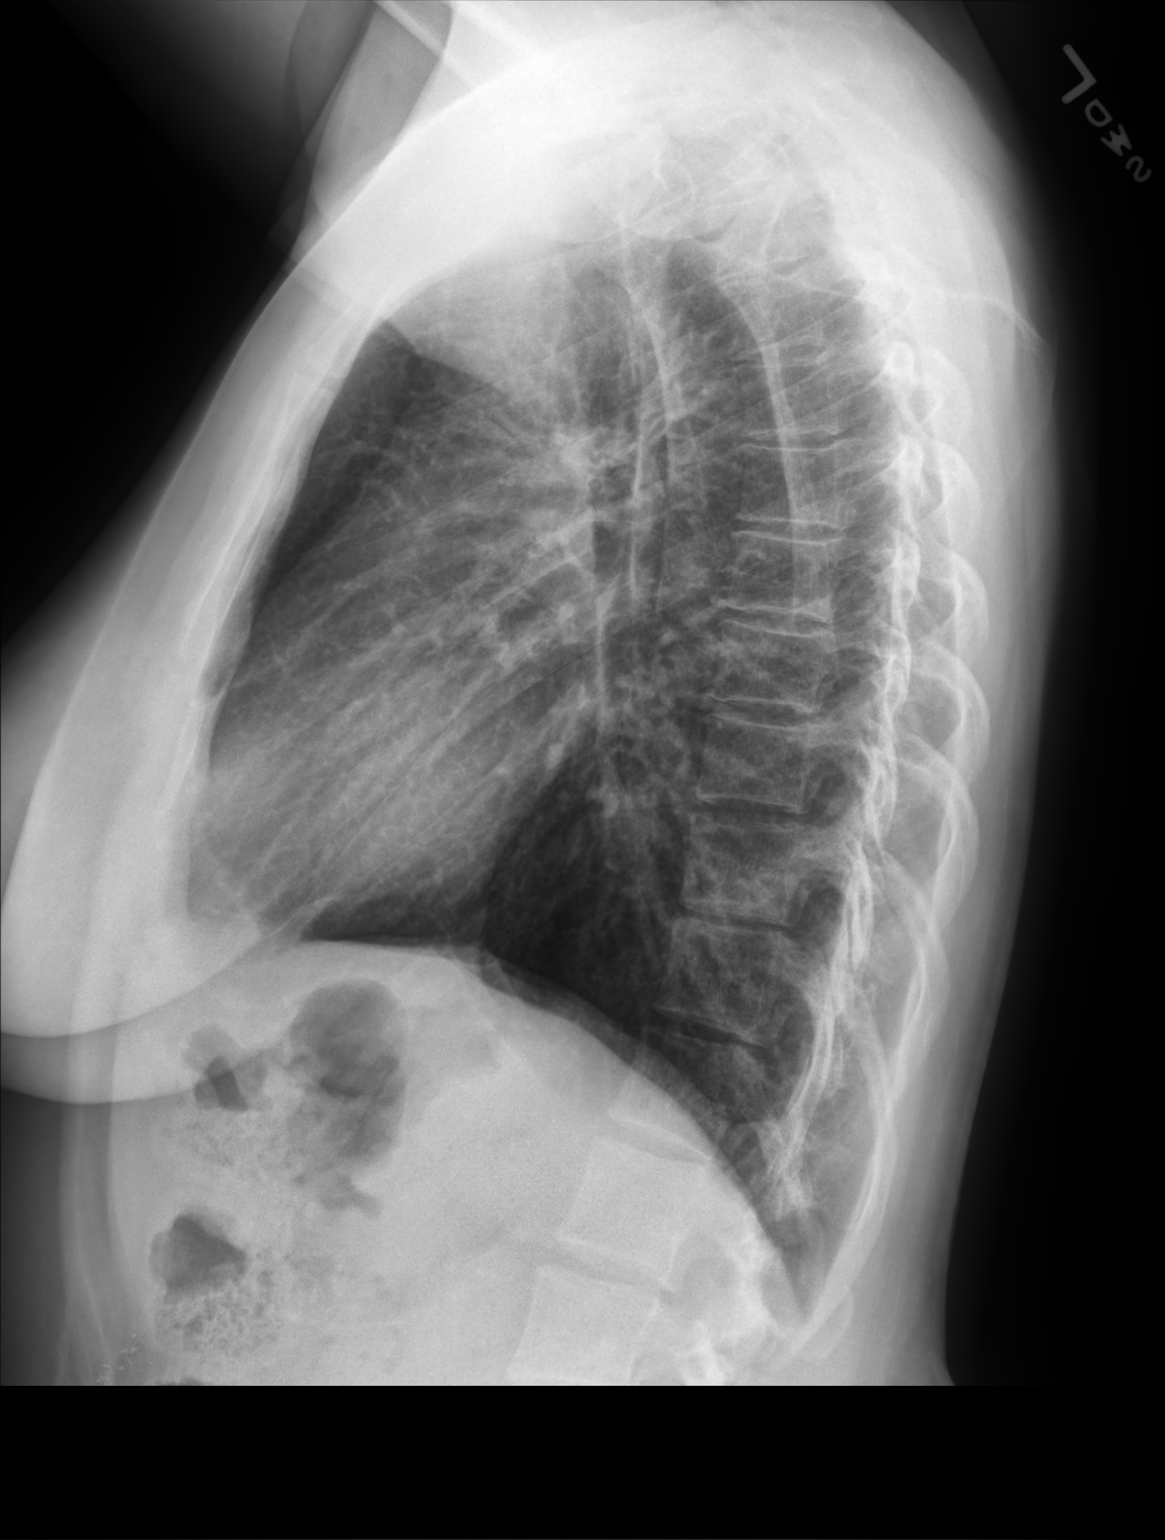

[2 of 2 positions shown; findings below may reference images not displayed]

FINDINGS: The heart size and mediastinal contours are within normal limits.
Both lungs are clear. No pneumothorax or pleural effusion is noted.
The visualized skeletal structures are unremarkable.
IMPRESSION: No active cardiopulmonary disease.

## 2019-05-26 MED ORDER — PREDNISONE 50 MG PO TABS: 50.0000 mg | ORAL_TABLET | Freq: Every day | ORAL | 0 refills | Status: DC

## 2019-05-26 MED ORDER — ALBUTEROL SULFATE (2.5 MG/3ML) 0.083% IN NEBU: 2.5000 mg | INHALATION_SOLUTION | Freq: Four times a day (QID) | RESPIRATORY_TRACT | 0 refills | Status: AC | PRN

## 2019-05-26 NOTE — Discharge Instructions (Addendum)
CXR negative for pneumonia. COVID PCR testing ordered. I would like you to quarantine until testing results. Prednisone as directed for shortness of breath. Albuterol as needed for shortness of breath, wheezing. Keep hydrated, urine should be clear to pale yellow in color. If experiencing worsening shortness of breath, chest pain, weakness, dizziness, go to the emergency department for further evaluation needed.

## 2019-05-26 NOTE — ED Provider Notes (Signed)
[facility]    CSN: [id number] Arrival date & time: 05/26/19  1638      History   Chief Complaint Chief Complaint  Patient presents with  . Shortness of Breath    HPI Ann Brown is a 34 y.o. female.   34 year old female comes in for 1 week history of URI symptoms that worsened 2 days ago. States at first had rhinorrhea, headache, ear pain, sore throat, dyspnea on exertion. She obtained COVID testing 2 days after symptom onset with negative results. Since then, has started having cough and worsening shortness of breath. Still able to go around the house, but has felt the shortness of breath more. Has had chills and nausea. No vomiting.  Denies fever, body aches. Denies abdominal pain, diarrhea. Denies loss of taste/smell. Current every day smoker, about 15-20 pack year history.    Patient with history of asthma and was supposed to be on advair as maintenance, due to financials, she has not been using advair. Since current symptom onset, she has been using left over advair twice a day, and albuterol twice a day.      Past Medical History:  Diagnosis Date  . Asthma   . Breast mass    fibroadenomas -   . GERD (gastroesophageal reflux disease)   . Kidney problem    Kideney reflux  . Tobacco use   . URI 03/01/2009    Patient Active Problem List   Diagnosis Date Noted  . Rash and nonspecific skin eruption 05/28/2017  . Skin lesion 05/28/2017  . Abnormal mammogram 01/11/2017  . Contusion of left ankle 10/29/2016  . Mild persistent asthma without complication 08/17/2016  . Suicidal ideation   . Chronic lower back pain 09/24/2014  . History of thyroid disease 02/24/2013  . ASCUS on Pap smear 02/01/2013  . TOBACCO USER 04/25/2009    Past Surgical History:  Procedure Laterality Date  . BOWEL RESECTION    . CHOLECYSTECTOMY    . Complex trunk closure  2005  . CSF SHUNT  1987  . TRACHEOSTOMY  1987    OB History    Gravida  3   Para  2   Term  2   Preterm      AB  1   Living  2     SAB      TAB      Ectopic      Multiple      Live Births  2            Home Medications    Prior to Admission medications   Medication Sig Start Date End Date Taking? Authorizing Provider  amphetamine-dextroamphetamine (ADDERALL) 20 MG tablet Take 20 mg by mouth 2 (two) times daily.   Yes [provider]  busPIRone (BUSPAR) 5 MG tablet Take 5 mg by mouth 3 (three) times daily.   Yes [provider]  ibuprofen (ADVIL,MOTRIN) 800 MG tablet Take 1 tablet (800 mg total) by mouth 3 (three) times daily. 10/07/16  Yes Law, Alexandra M, PA-C  albuterol (PROVENTIL) (2.5 MG/3ML) 0.083% nebulizer solution Take 3 mLs (2.5 mg total) by nebulization every 6 (six) hours as needed for wheezing or shortness of breath. 05/26/19   Cathie Hoops, Glennis Montenegro V, PA-C  albuterol (VENTOLIN HFA) 108 (90 Base) MCG/ACT inhaler Inhale 2 puffs into the lungs every 6 (six) hours as needed for wheezing. 05/27/17   Howard Pouch, MD  baclofen (LIORESAL) 10 MG tablet Take 1 tablet (10 mg  total) by mouth 3 (three) times daily. 05/27/17   Howard Pouch, MD  buPROPion [facility] SR) 100 MG 12 hr tablet take 1 tablet by oral route every morning 07/10/17   [provider]  esomeprazole (NEXIUM) 40 MG capsule TAKE 1 CAPSULE BY MOUTH EVERY DAY 12/31/16   Howard Pouch, MD  Fluticasone-Salmeterol (ADVAIR) 100-50 MCG/DOSE AEPB Inhale 1 puff into the lungs 2 (two) times daily. 05/27/17   Howard Pouch, MD  hydrOXYzine (ATARAX/VISTARIL) 10 MG tablet Take one (1) tablet by mouth at bedtime, as needed for insomnia 07/10/17   [provider]  methocarbamol (ROBAXIN) 500 MG tablet Take 1 tablet (500 mg total) by mouth every 8 (eight) hours as needed for muscle spasms. 07/12/17   [name], DO  naproxen (NAPROSYN) 500 MG tablet Take 1 tablet (500 mg total) by mouth 2 (two) times daily. 03/03/19   Sabas Sous, MD  nicotine (NICODERM CQ - DOSED IN MG/24 HOURS) 21 mg/24hr patch  Place 1 patch (21 mg total) onto the skin daily. [facility] [phone] 07/10/13   Moses Manners, MD  norethindrone (ORTHO MICRONOR) 0.35 MG tablet Take 1 tablet (0.35 mg total) by mouth daily. 02/22/16   Rumley, Lora Havens, DO  predniSONE (DELTASONE) 50 MG tablet Take 1 tablet (50 mg total) by mouth daily with breakfast. 05/26/19   Belinda Fisher, PA-C    Family History Family History  Problem Relation Age of Onset  . Multiple sclerosis Mother   . Cervical cancer Mother   . Breast cancer Maternal Grandmother        unsure of age    Social History Social History   Tobacco Use  . Smoking status: Current Every Day Smoker    Packs/day: 1.00    Types: Cigarettes, E-cigarettes  . Smokeless tobacco: Never Used  . Tobacco comment: Also uses ecigarette  Substance Use Topics  . Alcohol use: No    Alcohol/week: 0.0 standard drinks  . Drug use: No     Allergies   Patient has no known allergies.   Review of Systems Review of Systems  Reason unable to perform ROS: See HPI as above.     Physical Exam Triage Vital Signs ED Triage Vitals [05/26/19 1651]  Enc Vitals Group     BP 135/85     Pulse Rate (!) 111     Resp 18     Temp 98.3 F (36.8 C)     Temp Source Oral     SpO2 96 %     Weight      Height      Head Circumference      Peak Flow      Pain Score 7     Pain Loc      Pain Edu?      Excl. in GC?    No data found.  Updated Vital Signs BP 135/85 (BP Location: Left Arm)   Pulse 100   Temp 98.3 F (36.8 C) (Oral)   Resp 18   LMP 05/11/2019   SpO2 96%   Physical Exam Constitutional:      General: She is not in acute distress.    Appearance: Normal appearance. She is not ill-appearing, toxic-appearing or diaphoretic.  HENT:     Head: Normocephalic and atraumatic.     Mouth/Throat:     Mouth: Mucous membranes are moist.     Pharynx: Oropharynx is clear. Uvula midline.  Cardiovascular:     Rate and Rhythm: Regular rhythm.  Tachycardia present.     Heart  sounds: Normal heart sounds. No murmur. No friction rub. No gallop.   Pulmonary:     Effort: Pulmonary effort is normal. No accessory muscle usage, prolonged expiration, respiratory distress or retractions.     Comments: Speaking in full sentences without difficulty. At first with shallow breathing, no tachypnea. After some prompting, able to take deeper breaths with adequate air movement. Lungs clear to auscultation without adventitious lung sounds. Musculoskeletal:     Cervical back: Normal range of motion and neck supple.     Right lower leg: No tenderness. No edema.     Left lower leg: No tenderness. No edema.  Neurological:     General: No focal deficit present.     Mental Status: She is alert and oriented to person, place, and time.      UC Treatments / Results  Labs (all labs ordered are listed, but only abnormal results are displayed) Labs Reviewed  NOVEL CORONAVIRUS, NAA    EKG   Radiology DG Chest 2 View  Result Date: 05/26/2019 CLINICAL DATA:  Productive cough, shortness of breath. EXAM: CHEST - 2 VIEW COMPARISON:  April 17, 2018. FINDINGS: The heart size and mediastinal contours are within normal limits. Both lungs are clear. No pneumothorax or pleural effusion is noted. The visualized skeletal structures are unremarkable. IMPRESSION: No active cardiopulmonary disease. Electronically Signed   By: Marijo Conception M.D.   On: 05/26/2019 17:45    Procedures Procedures (including critical care time)  Medications Ordered in UC Medications - No data to display  Initial Impression / Assessment and Plan / UC Course  I have reviewed the triage vital signs and the nursing notes.  Pertinent labs & imaging results that were available during my care of the patient were reviewed by me and considered in my medical decision making (see chart for details).    Patient would like repeat COVID testing as prior testing was self swab, and currently with new symptom onset. Will  obtain COVID testing, patient to quarantine until testing results. Given new onset cough with shortness of breath, will obtain CXR for further evaluation as well.   CXR negative for active cardiopulmonary disease. Patient afebrile without tachypnea, hypoxia. HR ranging 99-118. Patient denies chest pain, palpitations, weakness, dizziness, syncope. No leg swelling. Will have patient push fluids and monitor HR for now. Will provide symptomatic treatment for reactive airway with prednisone, albuterol neb/inhaler. Discussed can increase albuterol to Q4-6H PRN. Smoking cessation discussed. Return precautions given. Patient expresses understanding and agrees to plan.  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 Provider   predniSONE (DELTASONE) 50 MG tablet Take 1 tablet (50 mg total) by mouth daily with breakfast. 5 tablet Kinte Trim V, PA-C   albuterol (PROVENTIL) (2.5 MG/3ML) 0.083% nebulizer solution Take 3 mLs (2.5 mg total) by nebulization every 6 (six) hours as needed for wheezing or shortness of breath. 75 mL Ok Edwards, PA-C     PDMP not reviewed this encounter.   Ok Edwards, PA-C 05/26/19 1807

## 2019-05-26 NOTE — ED Triage Notes (Addendum)
Pt c/o sob, runny nose, headache, ears, sore throat since 01/25. States has a neg covid test on 01/27. Pt states now having a cough x2 days and SOB is worse. States hx of asthma and inhalers aren't helping. No distress noted, speaking in complete sentences.

## 2019-05-27 LAB — NOVEL CORONAVIRUS, NAA: SARS-CoV-2, NAA: NOT DETECTED

## 2019-06-30 DIAGNOSIS — J9859 Other diseases of mediastinum, not elsewhere classified: Secondary | ICD-10-CM | POA: Insufficient documentation

## 2020-05-04 ENCOUNTER — Ambulatory Visit
Admission: RE | Admit: 2020-05-04 | Discharge: 2020-05-04 | Disposition: A | Discharge status: Home / Self Care | Payer: 59 | Source: Ambulatory Visit | Attending: Emergency Medicine | Admitting: Emergency Medicine

## 2020-05-04 ENCOUNTER — Other Ambulatory Visit: Payer: Self-pay

## 2020-05-04 VITALS — BP 104/72 | HR 97 | Temp 98.1°F | Resp 16

## 2020-05-04 DIAGNOSIS — J019 Acute sinusitis, unspecified: Secondary | ICD-10-CM

## 2020-05-04 MED ORDER — BENZONATATE 100 MG PO CAPS
100.0000 mg | ORAL_CAPSULE | Freq: Three times a day (TID) | ORAL | 0 refills | Status: DC
Start: 2020-05-04 — End: 2020-07-08

## 2020-05-04 MED ORDER — AMOXICILLIN-POT CLAVULANATE 875-125 MG PO TABS: 1.0000 | ORAL_TABLET | Freq: Two times a day (BID) | ORAL | 0 refills | Status: AC

## 2020-05-04 MED ORDER — AMOXICILLIN-POT CLAVULANATE 875-125 MG PO TABS: 1.0000 | ORAL_TABLET | Freq: Two times a day (BID) | ORAL | 0 refills | Status: DC

## 2020-05-04 MED ORDER — ALBUTEROL SULFATE HFA 108 (90 BASE) MCG/ACT IN AERS
1.0000 | INHALATION_SPRAY | Freq: Four times a day (QID) | RESPIRATORY_TRACT | 0 refills | Status: DC | PRN
Start: 2020-05-04 — End: 2020-05-04

## 2020-05-04 MED ORDER — BREZTRI AEROSPHERE 160-9-4.8 MCG/ACT IN AERO: 1.0000 | INHALATION_SPRAY | Freq: Two times a day (BID) | RESPIRATORY_TRACT | 0 refills | Status: DC

## 2020-05-04 MED ORDER — ALBUTEROL SULFATE HFA 108 (90 BASE) MCG/ACT IN AERS: 1.0000 | INHALATION_SPRAY | Freq: Four times a day (QID) | RESPIRATORY_TRACT | 0 refills | Status: DC | PRN

## 2020-05-04 MED ORDER — BENZONATATE 100 MG PO CAPS
100.0000 mg | ORAL_CAPSULE | Freq: Three times a day (TID) | ORAL | 0 refills | Status: DC
Start: 2020-05-04 — End: 2020-05-04

## 2020-05-04 NOTE — ED Triage Notes (Signed)
Pt c/o cough, congestion and body aches for the past few day.

## 2020-05-04 NOTE — ED Provider Notes (Signed)
[facility]    CSN: [id number] Arrival date & time: 05/04/20  1855      History   Chief Complaint Chief Complaint  Patient presents with  . app 7-cough    HPI Ann Brown is a 35 y.o. female history of asthma, tobacco use, presenting today for evaluation of cough congestion and body aches.  Patient reports that over the past 10 days she has developed URI symptoms which have been persistent.  Reports that she is needing COVID testing for personal reasons.  She denies any known exposures.  Has had some shortness of breath and chest tightness at times.  Reports intermittent fevers.  HPI  Past Medical History:  Diagnosis Date  . Asthma   . Breast mass    fibroadenomas -   . GERD (gastroesophageal reflux disease)   . Kidney problem    Kideney reflux  . Tobacco use   . URI 03/01/2009    Patient Active Problem List   Diagnosis Date Noted  . Rash and nonspecific skin eruption 05/28/2017  . Skin lesion 05/28/2017  . Abnormal mammogram 01/11/2017  . Contusion of left ankle 10/29/2016  . Mild persistent asthma without complication 08/17/2016  . Suicidal ideation   . Chronic lower back pain 09/24/2014  . History of thyroid disease 02/24/2013  . ASCUS on Pap smear 02/01/2013  . TOBACCO USER 04/25/2009    Past Surgical History:  Procedure Laterality Date  . BOWEL RESECTION    . CHOLECYSTECTOMY    . Complex trunk closure  2005  . CSF SHUNT  1987  . TRACHEOSTOMY  1987    OB History    Gravida  3   Para  2   Term  2   Preterm  0   AB  1   Living  2     SAB  0   IAB  0   Ectopic  0   Multiple      Live Births  2            Home Medications    Prior to Admission medications   Medication Sig Start Date End Date Taking? Authorizing Provider  Budeson-Glycopyrrol-Formoterol (BREZTRI AEROSPHERE) 160-9-4.8 MCG/ACT AERO Inhale 1-2 puffs into the lungs in the morning and at bedtime. 05/04/20  Yes Oprah Camarena C, PA-C  albuterol  (PROVENTIL) (2.5 MG/3ML) 0.083% nebulizer solution Take 3 mLs (2.5 mg total) by nebulization every 6 (six) hours as needed for wheezing or shortness of breath. 05/26/19   Cathie HoopsYu, Amy V, PA-C  albuterol (VENTOLIN HFA) 108 (90 Base) MCG/ACT inhaler Inhale 1-2 puffs into the lungs every 6 (six) hours as needed for wheezing or shortness of breath. 05/04/20   Quinnetta Roepke C, PA-C  amoxicillin-clavulanate (AUGMENTIN) 875-125 MG tablet Take 1 tablet by mouth every 12 (twelve) hours for 7 days. 05/04/20 05/11/20  Prisha Hiley C, PA-C  amphetamine-dextroamphetamine (ADDERALL) 20 MG tablet Take 20 mg by mouth 2 (two) times daily.    [provider]  baclofen (LIORESAL) 10 MG tablet Take 1 tablet (10 mg total) by mouth 3 (three) times daily. 05/27/17   Howard PouchFeng, Lauren, MD  benzonatate (TESSALON) 100 MG capsule Take 1-2 capsules (100-200 mg total) by mouth every 8 (eight) hours. 05/04/20   Aideliz Garmany C, PA-C  buPROPion (WELLBUTRIN SR) 100 MG 12 hr tablet take 1 tablet by oral route every morning 07/10/17   [provider]  busPIRone (BUSPAR) 5 MG tablet Take 5 mg by mouth 3 (three) times  daily.    [provider]  esomeprazole (NEXIUM) 40 MG capsule TAKE 1 CAPSULE BY MOUTH EVERY DAY 12/31/16   Howard Pouch, MD  hydrOXYzine (ATARAX/VISTARIL) 10 MG tablet Take one (1) tablet by mouth at bedtime, as needed for insomnia 07/10/17   [provider]  Fluticasone-Salmeterol (ADVAIR) 100-50 MCG/DOSE AEPB Inhale 1 puff into the lungs 2 (two) times daily. 05/27/17 05/04/20  Howard Pouch, MD    Family History Family History  Problem Relation Age of Onset  . Multiple sclerosis Mother   . Cervical cancer Mother   . Breast cancer Maternal Grandmother        unsure of age    Social History Social History   Tobacco Use  . Smoking status: Current Every Day Smoker    Packs/day: 1.00    Types: Cigarettes, E-cigarettes  . Smokeless tobacco: Never Used  . Tobacco comment: Also uses ecigarette   Substance Use Topics  . Alcohol use: No    Alcohol/week: 0.0 standard drinks  . Drug use: No     Allergies   Patient has no known allergies.   Review of Systems Review of Systems  Constitutional: Positive for fatigue and fever. Negative for activity change, appetite change and chills.  HENT: Positive for congestion, rhinorrhea and sinus pressure. Negative for ear pain, sore throat and trouble swallowing.   Eyes: Negative for discharge and redness.  Respiratory: Positive for cough, chest tightness and shortness of breath.   Cardiovascular: Negative for chest pain.  Gastrointestinal: Negative for abdominal pain, diarrhea, nausea and vomiting.  Musculoskeletal: Negative for myalgias.  Skin: Negative for rash.  Neurological: Positive for headaches. Negative for dizziness and light-headedness.     Physical Exam Triage Vital Signs ED Triage Vitals  Enc Vitals Group     BP 05/04/20 1939 104/72     Pulse Rate 05/04/20 1939 97     Resp 05/04/20 1939 16     Temp 05/04/20 1939 98.1 F (36.7 C)     Temp Source 05/04/20 1939 Oral     SpO2 05/04/20 1939 98 %     Weight --      Height --      Head Circumference --      Peak Flow --      Pain Score 05/04/20 2005 5     Pain Loc --      Pain Edu? --      Excl. in GC? --    No data found.  Updated Vital Signs BP 104/72 (BP Location: Left Arm)   Pulse 97   Temp 98.1 F (36.7 C) (Oral)   Resp 16   SpO2 98%   Visual Acuity Right Eye Distance:   Left Eye Distance:   Bilateral Distance:    Right Eye Near:   Left Eye Near:    Bilateral Near:     Physical Exam Vitals and nursing note reviewed.  Constitutional:      Appearance: She is well-developed and well-nourished.     Comments: No acute distress  HENT:     Head: Normocephalic and atraumatic.     Ears:     Comments: Bilateral ears without tenderness to palpation of external auricle, tragus and mastoid, EAC's without erythema or swelling, TM's with good bony  landmarks and cone of light. Non erythematous.     Nose: Nose normal.     Mouth/Throat:     Comments: Oral mucosa pink and moist, no tonsillar enlargement or exudate. Posterior pharynx patent and  nonerythematous, no uvula deviation or swelling. Normal phonation. Eyes:     Conjunctiva/sclera: Conjunctivae normal.  Cardiovascular:     Rate and Rhythm: Normal rate.  Pulmonary:     Effort: Pulmonary effort is normal. No respiratory distress.     Comments: Breathing comfortably at rest, CTABL, no wheezing, rales or other adventitious sounds auscultated Abdominal:     General: There is no distension.  Musculoskeletal:        General: Normal range of motion.     Cervical back: Neck supple.  Skin:    General: Skin is warm and dry.  Neurological:     Mental Status: She is alert and oriented to person, place, and time.  Psychiatric:        Mood and Affect: Mood and affect normal.      UC Treatments / Results  Labs (all labs ordered are listed, but only abnormal results are displayed) Labs Reviewed  NOVEL CORONAVIRUS, NAA    EKG   Radiology No results found.  Procedures Procedures (including critical care time)  Medications Ordered in UC Medications - No data to display  Initial Impression / Assessment and Plan / UC Course  I have reviewed the triage vital signs and the nursing notes.  Pertinent labs & imaging results that were available during my care of the patient were reviewed by me and considered in my medical decision making (see chart for details).     10 days of URI symptoms with cough, exam reassuring, lungs clear to auscultation at this time, COVID test pending.  Opting to treat for sinusitis with Augmentin, Tessalon for cough, refilled albuterol inhaler as well as maintenance inhaler.  Continue to monitor breathing, deferring oral steroids at this time given without wheezing, patient agreeable to deferring this.  Discussed strict return precautions. Patient  verbalized understanding and is agreeable with plan.  Final Clinical Impressions(s) / UC Diagnoses   Final diagnoses:  Acute sinusitis with symptoms > 10 days     Discharge Instructions     COVID test pending Begin Augmentin twice daily to help cover infection in sinuses and lungs Tessalon every 8 hours for cough Albuterol inhaler I refilled Breztri Follow-up if not improving or worsening    ED Prescriptions    Medication Sig Dispense Auth. Provider   amoxicillin-clavulanate (AUGMENTIN) 875-125 MG tablet  (Status: Discontinued) Take 1 tablet by mouth every 12 (twelve) hours for 7 days. 14 tablet Sher Shampine C, PA-C   albuterol (VENTOLIN HFA) 108 (90 Base) MCG/ACT inhaler  (Status: Discontinued) Inhale 1-2 puffs into the lungs every 6 (six) hours as needed for wheezing or shortness of breath. 18 g Khai Torbert C, PA-C   benzonatate (TESSALON) 100 MG capsule  (Status: Discontinued) Take 1-2 capsules (100-200 mg total) by mouth every 8 (eight) hours. 30 capsule Lash Matulich C, PA-C   albuterol (VENTOLIN HFA) 108 (90 Base) MCG/ACT inhaler Inhale 1-2 puffs into the lungs every 6 (six) hours as needed for wheezing or shortness of breath. 18 g Rosemary Mossbarger C, PA-C   amoxicillin-clavulanate (AUGMENTIN) 875-125 MG tablet Take 1 tablet by mouth every 12 (twelve) hours for 7 days. 14 tablet Inocencio Roy C, PA-C   benzonatate (TESSALON) 100 MG capsule Take 1-2 capsules (100-200 mg total) by mouth every 8 (eight) hours. 30 capsule Ada Woodbury C, PA-C   Budeson-Glycopyrrol-Formoterol (BREZTRI AEROSPHERE) 160-9-4.8 MCG/ACT AERO Inhale 1-2 puffs into the lungs in the morning and at bedtime. 10.7 g Nieves Chapa, [place] C, [place]  PDMP not reviewed this encounter.   Lew Dawes, [place] 05/04/20 2037

## 2020-05-04 NOTE — Discharge Instructions (Addendum)
COVID test pending Begin Augmentin twice daily to help cover infection in sinuses and lungs Tessalon every 8 hours for cough Albuterol inhaler I refilled Breztri Follow-up if not improving or worsening

## 2020-05-08 LAB — NOVEL CORONAVIRUS, NAA: SARS-CoV-2, NAA: NOT DETECTED

## 2020-06-09 ENCOUNTER — Telehealth: Payer: 59 | Admitting: Physician Assistant

## 2020-06-09 DIAGNOSIS — M79601 Pain in right arm: Secondary | ICD-10-CM | POA: Diagnosis not present

## 2020-06-09 NOTE — Progress Notes (Signed)
C/o right shoulder pain 8/10 Unable to lift shoulder Wears a sling but has really bad tingling and throbbing, constantly. Unable to sleep at night.

## 2020-06-09 NOTE — Progress Notes (Signed)
New Patient Office Visit  Subjective:  Patient ID: Ann Brown, female    DOB: 28-Jul-1985  Age: 35 y.o. MRN: [id number]  CC:  Chief Complaint  Patient presents with  . right arm pain   Virtual Visit via Telephone Note  I connected with Ann Brown on 06/11/20 at  3:40 PM EST by telephone and verified that I am speaking with the correct person using two identifiers.  Location: Patient: Home Provider: [facility] Unit    I discussed the limitations, risks, security and privacy concerns of performing an evaluation and management service by telephone and the availability of in person appointments. I also discussed with the patient that there may be a patient responsible charge related to this service. The patient expressed understanding and agreed to proceed.   History of Present Illness:  Ann LIGHTLE reports that she has been having pain in her right upper arm for the last week and a half, states that she felt something pop between her shoulder and elbow while she was vacuuming it.  Denies bruising, swelling, numbness or tingling.  Reports that she has been using Tylenol, ibuprofen, muscle relaxers, heating pad without relief.     Observations/Objective: Medical history and current medications reviewed, no physical exam completed     Past Medical History:  Diagnosis Date  . Asthma   . Breast mass    fibroadenomas -   . GERD (gastroesophageal reflux disease)   . Kidney problem    Kideney reflux  . Tobacco use   . URI 03/01/2009    Past Surgical History:  Procedure Laterality Date  . BOWEL RESECTION    . CHOLECYSTECTOMY    . Complex trunk closure  2005  . CSF SHUNT  1987  . TRACHEOSTOMY  1987    Family History  Problem Relation Age of Onset  . Multiple sclerosis Mother   . Cervical cancer Mother   . Breast cancer Maternal Grandmother        unsure of age    Social History   Socioeconomic History  . Marital status: Single     Spouse name: Not on file  . Number of children: Not on file  . Years of education: Not on file  . Highest education level: Not on file  Occupational History  . Not on file  Tobacco Use  . Smoking status: Current Every Day Smoker    Packs/day: 1.00    Types: Cigarettes, E-cigarettes  . Smokeless tobacco: Never Used  . Tobacco comment: Also uses ecigarette  Substance and Sexual Activity  . Alcohol use: No    Alcohol/week: 0.0 standard drinks  . Drug use: No  . Sexual activity: Yes    Birth control/protection: None, Pill  Other Topics Concern  . Not on file  Social History Narrative   ** Merged History Encounter **       Social Determinants of Health   Financial Resource Strain: Not on file  Food Insecurity: Not on file  Transportation Needs: Not on file  Physical Activity: Not on file  Stress: Not on file  Social Connections: Not on file  Intimate Partner Violence: Not on file    ROS Review of Systems  Constitutional: Negative for chills and fever.  HENT: Negative.   Eyes: Negative.   Respiratory: Negative.   Cardiovascular: Negative.   Gastrointestinal: Negative.   Endocrine: Negative.   Genitourinary: Negative.   Musculoskeletal: Positive for myalgias.  Skin: Negative for wound.  Allergic/Immunologic:  Negative.   Neurological: Negative.   Hematological: Negative.   Psychiatric/Behavioral: Negative.     Objective:   Today's Vitals: LMP 05/24/2020     Assessment & Plan:   Problem List Items Addressed This Visit      Other   Arm pain, right - Primary      Outpatient Encounter Medications as of 06/09/2020  Medication Sig  . albuterol (PROVENTIL) (2.5 MG/3ML) 0.083% nebulizer solution Take 3 mLs (2.5 mg total) by nebulization every 6 (six) hours as needed for wheezing or shortness of breath.  Marland Kitchen albuterol (VENTOLIN HFA) 108 (90 Base) MCG/ACT inhaler Inhale 1-2 puffs into the lungs every 6 (six) hours as needed for wheezing or shortness of breath.  .  amphetamine-dextroamphetamine (ADDERALL) 20 MG tablet Take 20 mg by mouth 2 (two) times daily.  . baclofen (LIORESAL) 10 MG tablet Take 1 tablet (10 mg total) by mouth 3 (three) times daily.  . Budeson-Glycopyrrol-Formoterol (BREZTRI AEROSPHERE) 160-9-4.8 MCG/ACT AERO Inhale 1-2 puffs into the lungs in the morning and at bedtime.  Marland Kitchen buPROPion (WELLBUTRIN SR) 100 MG 12 hr tablet take 1 tablet by oral route every morning  . busPIRone (BUSPAR) 5 MG tablet Take 5 mg by mouth 3 (three) times daily.  Marland Kitchen esomeprazole (NEXIUM) 40 MG capsule TAKE 1 CAPSULE BY MOUTH EVERY DAY  . hydrOXYzine (ATARAX/VISTARIL) 10 MG tablet Take one (1) tablet by mouth at bedtime, as needed for insomnia  . benzonatate (TESSALON) 100 MG capsule Take 1-2 capsules (100-200 mg total) by mouth every 8 (eight) hours. (Patient not taking: Reported on 06/09/2020)  . [DISCONTINUED] Fluticasone-Salmeterol (ADVAIR) 100-50 MCG/DOSE AEPB Inhale 1 puff into the lungs 2 (two) times daily.   No facility-administered encounter medications on file as of 06/09/2020.   Assessment and Plan:  1. Arm pain, right Patient described her pain as 10 out of 10, encourage patient to present to urgent care or emergency department for prompt evaluation.  Gave patient education on orthopedic urgent cares.  Patient states that she has appointment with her orthopedic on Tuesday, June 14, 2020.   Follow Up Instructions:    I discussed the assessment and treatment plan with the patient. The patient was provided an opportunity to ask questions and all were answered. The patient agreed with the plan and demonstrated an understanding of the instructions.   The patient was advised to call back or seek an in-person evaluation if the symptoms worsen or if the condition fails to improve as anticipated.  I provided 12 minutes of non-face-to-face time during this encounter.   Follow-up: Return if symptoms worsen or fail to improve.   Kasandra Knudsen Mayers, PA-C

## 2020-06-11 ENCOUNTER — Ambulatory Visit
Admission: RE | Admit: 2020-06-11 | Discharge: 2020-06-11 | Disposition: A | Discharge status: Home / Self Care | Payer: 59 | Source: Ambulatory Visit | Attending: Emergency Medicine | Admitting: Emergency Medicine

## 2020-06-11 ENCOUNTER — Other Ambulatory Visit: Payer: Self-pay

## 2020-06-11 VITALS — BP 131/76 | HR 93 | Temp 98.5°F | Resp 16

## 2020-06-11 DIAGNOSIS — M79601 Pain in right arm: Secondary | ICD-10-CM

## 2020-06-11 DIAGNOSIS — M25511 Pain in right shoulder: Secondary | ICD-10-CM | POA: Diagnosis not present

## 2020-06-11 HISTORY — DX: Pain in right arm: M79.601

## 2020-06-11 MED ORDER — PREDNISONE 10 MG PO TABS: ORAL_TABLET | ORAL | 0 refills | Status: DC

## 2020-06-11 NOTE — ED Triage Notes (Signed)
Pt sts upper right arm pain after injuring 3 weeks ago; pt sts has appt with ortho this week but having pain

## 2020-06-11 NOTE — Patient Instructions (Signed)
Muscle Pain, Adult Muscle pain, also called myalgia, is a condition in which a person has pain in one or more muscles in the body. Muscle pain may be mild, moderate, or severe. It may feel sharp, achy, or burning. In most cases, the pain lasts only a short time and goes away without treatment. Muscle pain can result from using muscles in a new or different way or after a period of inactivity. It is normal to feel some muscle pain after starting an exercise program. Muscles that have not been used often will be sore at first. What are the causes? This condition is caused by using muscles in a new or different way after a period of inactivity. Other causes may include:  Overuse or muscle strain, especially if you are not in shape. This is the most common cause of muscle pain.  Injury or bruising.  Infectious diseases, including diseases caused by viruses, such as the flu (influenza).  Fibromyalgia.This is a long-term, or chronic, condition that causes muscle tenderness, tiredness (fatigue), and headache.  Autoimmune or rheumatologic diseases. These are conditions, such as lupus, in which the body's defense system (immunesystem) attacks areas in the body.  Certain medicines, including ACE inhibitors and statins. What are the signs or symptoms? The main symptom of this condition is sore or painful muscles, including during activity and when stretching. You may also have slight swelling. How is this diagnosed? This condition is diagnosed with a physical exam. Your health care provider will ask questions about your pain and when it began. If you have not had muscle pain for very long, your health care provider may want to wait before doing much testing. If your muscle pain has lasted a long time, tests may be done right away. In some cases, this may include tests to rule out certain conditions or illnesses. How is this treated? Treatment for this condition depends on the cause. Home care is often  enough to relieve muscle pain. Your health care provider may also prescribe NSAIDs, such as ibuprofen. Follow these instructions at home: Medicines  Take over-the-counter and prescription medicines only as told by your health care provider.  Ask your health care provider if the medicine prescribed to you requires you to avoid driving or using machinery. Managing pain, swelling, and discomfort  If directed, put ice on the painful area. To do this: ? Put ice in a plastic bag. ? Place a towel between your skin and the bag. ? Leave the ice on for 20 minutes, 2-3 times a day.  For the first 2 days of muscle soreness, or if there is swelling: ? Do not soak in hot baths. ? Do not use a hot tub, steam room, sauna, heating pad, or other heat source.  After 48-72 hours, you may alternate between applying ice and applying heat as told by your health care provider. If directed, apply heat to the affected area as often as told by your health care provider. Use the heat source that your health care provider recommends, such as a moist heat pack or a heating pad. ? Place a towel between your skin and the heat source. ? Leave the heat on for 20-30 minutes. ? Remove the heat if your skin turns bright red. This is especially important if you are unable to feel pain, heat, or cold. You may have a greater risk of getting burned.  If you have an injury, raise (elevate) the injured area above the level of your heart while you   are sitting or lying down.      Activity  If overuse is causing your muscle pain: ? Slow down your activities until the pain goes away. ? Do regular, gentle exercises if you are not usually active. ? Warm up before exercising. Stretch before and after exercising. This can help lower the risk of muscle pain.  Do not continue working out if the pain is severe. Severe pain could mean that you have injured a muscle.  Do not lift anything that is heavier than 5-10 lb (2.3-4.5 kg), or  the limit that you are told, until your health care provider says that it is safe.  Return to your normal activities as told by your health care provider. Ask your health care provider what activities are safe for you.   General instructions  Do not use any products that contain nicotine or tobacco, such as cigarettes, e-cigarettes, and chewing tobacco. These can delay healing. If you need help quitting, ask your health care provider.  Keep all follow-up visits as told by your health care provider. This is important. Contact a health care provider if you have:  Muscle pain that gets worse and medicines do not help.  Muscle pain that lasts longer than 3 days.  A rash or fever along with muscle pain.  Muscle pain after a tick bite.  Muscle pain while working out, even though you are in good physical condition.  Redness, soreness, or swelling along with muscle pain.  Muscle pain after starting a new medicine or changing the dose of a medicine. Get help right away if you have:  Trouble breathing.  Trouble swallowing.  Muscle pain along with a stiff neck, fever, and vomiting.  Severe muscle weakness or you cannot move part of your body. These symptoms may represent a serious problem that is an emergency. Do not wait to see if the symptoms will go away. Get medical help right away. Call your local emergency services (911 in the [place] Do not drive yourself to the hospital. Summary  Muscle pain usually lasts only a short time and goes away without treatment.  This condition is caused by using muscles in a new or different way after a period of inactivity.  If your muscle pain lasts longer than 3 days, tell your health care provider. This information is not intended to replace advice given to you by your health care provider. Make sure you discuss any questions you have with your health care provider. Document Revised: 01/16/2019 Document Reviewed: 01/16/2019 Elsevier Patient  Education  2021 [employer]  

## 2020-06-11 NOTE — Discharge Instructions (Signed)
Begin prednisone taper x6 days-begin with 6 tablets on day 1, decrease by 1 tablet each day until complete-6, 5, 4, 3, 2, 1-take with food Follow-up with orthopedics as planned Gentle shoulder stretching and range of motion exercises Alternate ice and heat

## 2020-06-11 NOTE — ED Provider Notes (Signed)
[facility]    CSN: [id number] Arrival date & time: 06/11/20  1502      History   Chief Complaint Chief Complaint  Patient presents with  . Arm Pain    HPI Ann Brown is a 35 y.o. female history of tobacco use, GERD, presenting today for evaluation of right arm injury.  Reports popping sensation between her shoulder and elbow approximately 1 week ago.  Incident occurred while she was vacuuming.  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  Reports remote history of rotator cuff injury needing repair.  Has plans to follow-up with orthopedics on February 22.  She has been using Tylenol and ibuprofen without relief.  HPI  Past Medical History:  Diagnosis Date  . Asthma   . Breast mass    fibroadenomas -   . GERD (gastroesophageal reflux disease)   . Kidney problem    Kideney reflux  . Tobacco use   . URI 03/01/2009    Patient Active Problem List   Diagnosis Date Noted  . Arm pain, right 06/11/2020  . Rash and nonspecific skin eruption 05/28/2017  . Skin lesion 05/28/2017  . Abnormal mammogram 01/11/2017  . Contusion of left ankle 10/29/2016  . Mild persistent asthma without complication 08/17/2016  . Suicidal ideation   . Chronic lower back pain 09/24/2014  . History of thyroid disease 02/24/2013  . ASCUS on Pap smear 02/01/2013  . TOBACCO USER 04/25/2009    Past Surgical History:  Procedure Laterality Date  . BOWEL RESECTION    . CHOLECYSTECTOMY    . Complex trunk closure  2005  . CSF SHUNT  1987  . TRACHEOSTOMY  1987    OB History    Gravida  3   Para  2   Term  2   Preterm  0   AB  1   Living  2     SAB  0   IAB  0   Ectopic  0   Multiple      Live Births  2            Home Medications    Prior to Admission medications   Medication Sig Start Date End Date Taking? Authorizing Provider  predniSONE (DELTASONE) 10 MG tablet Begin  with 6 tabs on day 1, 5 tab on day 2, 4 tab on day 3, 3 tab on day 4, 2 tab on day 5, 1 tab on day 6-take with food 06/11/20  Yes Serenitee Fuertes C, PA-C  albuterol (PROVENTIL) (2.5 MG/3ML) 0.083% nebulizer solution Take 3 mLs (2.5 mg total) by nebulization every 6 (six) hours as needed for wheezing or shortness of breath. 05/26/19   Cathie Hoops, Amy V, PA-C  albuterol (VENTOLIN HFA) 108 (90 Base) MCG/ACT inhaler Inhale 1-2 puffs into the lungs every 6 (six) hours as needed for wheezing or shortness of breath. 05/04/20   Gerard Bonus C, PA-C  amphetamine-dextroamphetamine (ADDERALL) 20 MG tablet Take 20 mg by mouth 2 (two) times daily.    [provider]  baclofen (LIORESAL) 10 MG tablet Take 1 tablet (10 mg total) by mouth 3 (three) times daily. 05/27/17   Howard Pouch, MD  benzonatate (TESSALON) 100 MG capsule Take 1-2 capsules (100-200 mg total) by mouth every 8 (eight) hours. Patient not taking: Reported on 06/09/2020 05/04/20   Sultan Pargas, Junius Creamer, PA-C  Budeson-Glycopyrrol-Formoterol (BREZTRI AEROSPHERE) 160-9-4.8 MCG/ACT AERO Inhale 1-2 puffs into  the lungs in the morning and at bedtime. 05/04/20   Deserie Dirks C, PA-C  buPROPion (WELLBUTRIN SR) 100 MG 12 hr tablet take 1 tablet by oral route every morning 07/10/17   [provider]  busPIRone (BUSPAR) 5 MG tablet Take 5 mg by mouth 3 (three) times daily.    [provider]  esomeprazole (NEXIUM) 40 MG capsule TAKE 1 CAPSULE BY MOUTH EVERY DAY 12/31/16   Howard Pouch, MD  hydrOXYzine (ATARAX/VISTARIL) 10 MG tablet Take one (1) tablet by mouth at bedtime, as needed for insomnia 07/10/17   [provider]  Fluticasone-Salmeterol (ADVAIR) 100-50 MCG/DOSE AEPB Inhale 1 puff into the lungs 2 (two) times daily. 05/27/17 05/04/20  Howard Pouch, MD    Family History Family History  Problem Relation Age of Onset  . Multiple sclerosis Mother   . Cervical cancer Mother   . Breast cancer Maternal Grandmother        unsure of age     Social History Social History   Tobacco Use  . Smoking status: Current Every Day Smoker    Packs/day: 1.00    Types: Cigarettes, E-cigarettes  . Smokeless tobacco: Never Used  . Tobacco comment: Also uses ecigarette  Substance Use Topics  . Alcohol use: No    Alcohol/week: 0.0 standard drinks  . Drug use: No     Allergies   Patient has no known allergies.   Review of Systems Review of Systems  Constitutional: Negative for fatigue and fever.  Eyes: Negative for visual disturbance.  Respiratory: Negative for shortness of breath.   Cardiovascular: Negative for chest pain.  Gastrointestinal: Negative for abdominal pain, nausea and vomiting.  Musculoskeletal: Positive for arthralgias and myalgias. Negative for joint swelling.  Skin: Negative for color change, rash and wound.  Neurological: Negative for dizziness, weakness, light-headedness and headaches.     Physical Exam Triage Vital Signs ED Triage Vitals  Enc Vitals Group     BP      Pulse      Resp      Temp      Temp src      SpO2      Weight      Height      Head Circumference      Peak Flow      Pain Score      Pain Loc      Pain Edu?      Excl. in GC?    No data found.  Updated Vital Signs BP 131/76 (BP Location: Left Arm)   Pulse 93   Temp 98.5 F (36.9 C) (Oral)   Resp 16   LMP 05/24/2020   SpO2 98%   Visual Acuity Right Eye Distance:   Left Eye Distance:   Bilateral Distance:    Right Eye Near:   Left Eye Near:    Bilateral Near:     Physical Exam Vitals and nursing note reviewed.  Constitutional:      Appearance: She is well-developed and well-nourished.     Comments: No acute distress  HENT:     Head: Normocephalic and atraumatic.     Nose: Nose normal.  Eyes:     Conjunctiva/sclera: Conjunctivae normal.  Cardiovascular:     Rate and Rhythm: Normal rate.  Pulmonary:     Effort: Pulmonary effort is normal. No respiratory distress.  Abdominal:     General: There is no  distension.  Musculoskeletal:        General: Normal  range of motion.     Cervical back: Neck supple.     Comments: Right arm: No obvious swelling deformity or discoloration, tenderness to palpation of AC joint extending into proximal humeral area, limited range of motion beyond approximately 90 degrees abduction, strength 5/5 and equal bilaterally, negative resisted external rotation Tenderness to palpation along bony prominences of elbow, full active range of motion of elbow, radial pulse 2+, sensation intact distally  Skin:    General: Skin is warm and dry.  Neurological:     Mental Status: She is alert and oriented to person, place, and time.  Psychiatric:        Mood and Affect: Mood and affect normal.      UC Treatments / Results  Labs (all labs ordered are listed, but only abnormal results are displayed) Labs Reviewed - No data to display  EKG   Radiology No results found.  Procedures Procedures (including critical care time)  Medications Ordered in UC Medications - No data to display  Initial Impression / Assessment and Plan / UC Course  I have reviewed the triage vital signs and the nursing notes.  Pertinent labs & imaging results that were available during my care of the patient were reviewed by me and considered in my medical decision making (see chart for details).     Acute on chronic shoulder injury, muscle strain versus other tendinopathy, neurovascularly intact, has been using NSAIDs without relief, will do trial of prednisone, patient reports she does not tolerate Toradol well.  Gentle range of motion exercises and follow-up with sports medicine/orthopedics as planned for further imaging.  Deferring x-ray, do not suspect acute bony abnormality.  Discussed strict return precautions. Patient verbalized understanding and is agreeable with plan.  Final Clinical Impressions(s) / UC Diagnoses   Final diagnoses:  Acute pain of right shoulder     Discharge  Instructions     Begin prednisone taper x6 days-begin with 6 tablets on day 1, decrease by 1 tablet each day until complete-6, 5, 4, 3, 2, 1-take with food Follow-up with orthopedics as planned Gentle shoulder stretching and range of motion exercises Alternate ice and heat     ED Prescriptions    Medication Sig Dispense Auth. Provider   predniSONE (DELTASONE) 10 MG tablet Begin with 6 tabs on day 1, 5 tab on day 2, 4 tab on day 3, 3 tab on day 4, 2 tab on day 5, 1 tab on day 6-take with food 21 tablet Zarahi Fuerst C, PA-C     PDMP not reviewed this encounter.   Lew Dawes, [place] 06/12/20 [phone]

## 2020-06-14 ENCOUNTER — Ambulatory Visit (INDEPENDENT_AMBULATORY_CARE_PROVIDER_SITE_OTHER): Payer: 59

## 2020-06-14 ENCOUNTER — Other Ambulatory Visit: Payer: Self-pay

## 2020-06-14 ENCOUNTER — Ambulatory Visit (INDEPENDENT_AMBULATORY_CARE_PROVIDER_SITE_OTHER): Payer: 59 | Admitting: Orthopaedic Surgery

## 2020-06-14 DIAGNOSIS — M25511 Pain in right shoulder: Secondary | ICD-10-CM

## 2020-06-14 DIAGNOSIS — G8929 Other chronic pain: Secondary | ICD-10-CM

## 2020-06-14 DIAGNOSIS — M7711 Lateral epicondylitis, right elbow: Secondary | ICD-10-CM

## 2020-06-14 MED ORDER — LIDOCAINE HCL 1 % IJ SOLN
1.0000 mL | INTRAMUSCULAR | Status: AC | PRN
Start: 2020-06-14 — End: 2020-06-14
  Administered 2020-06-14: 1 mL

## 2020-06-14 MED ORDER — METHYLPREDNISOLONE ACETATE 40 MG/ML IJ SUSP
40.0000 mg | INTRAMUSCULAR | Status: AC | PRN
  Administered 2020-06-14: 40 mg

## 2020-06-14 MED ORDER — BUPIVACAINE HCL 0.25 % IJ SOLN
0.3300 mL | INTRAMUSCULAR | Status: AC | PRN
  Administered 2020-06-14: .33 mL

## 2020-06-14 MED ORDER — TRAMADOL HCL 50 MG PO TABS: 50.0000 mg | ORAL_TABLET | Freq: Three times a day (TID) | ORAL | 0 refills | Status: DC | PRN

## 2020-06-14 NOTE — Progress Notes (Signed)
Office Visit Note   Patient: Ann Brown           Date of Birth: 19-May-1985           MRN: [id number] Visit Date: 06/14/2020              Requested by: Melene Plan, MD [address] [place],  [place] [address] PCP: Melene Plan, MD   Assessment & Plan: Visit Diagnoses:  1. Chronic right shoulder pain   2. Lateral epicondylitis, right elbow     Plan: Impression is chronic right shoulder pain with new injury concerning for rotator cuff/biceps pathology and right elbow lateral epicondylitis.  In regards to the right shoulder, we have discussed MRI for which she would like to proceed.  She will follow up with [place] once that has been completed.  In regards to the elbow, we have discussed cortisone injection, tennis elbow strap and home exercise program.  She will follow up with [place] as needed for her elbow.  Follow-Up Instructions: Return for after MRI.   Orders:  Orders Placed This Encounter  Procedures  . XR Shoulder Right  . XR Elbow Complete Right (3+View)   No orders of the defined types were placed in this encounter.     Procedures: Hand/UE Inj: R elbow for lateral epicondylitis on 06/14/2020 3:08 PM Indications: pain Details: 22 G needle Medications: 1 mL lidocaine 1 %; 0.33 mL bupivacaine 0.25 %; 40 mg methylPREDNISolone acetate 40 MG/ML      Clinical Data: No additional findings.   Subjective: Chief Complaint  Patient presents with  . Right Shoulder - Pain    HPI patient is a 35 year old female who comes in today with complaints of pain to the right shoulder and right elbow.  She has been dealing with the right elbow pain for a while.  She has been seen by [facility] where she was diagnosed with what sounds like tennis elbow.  No previous injection or therapy for the elbow.  The pain she has is to the lateral aspect but does occasionally note pain posteriorly.  Pain is worse with elbow flexion, extension, supination or pronation.  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  She has been taking Tylenol and ibuprofen as well as using heating pad without relief.  The other issue she brings up today is right shoulder pain.  She notes that she was vacuuming about 2 weeks ago when she felt a pop to the deltoid.  She has had pain throughout the entire upper arm since.  Pain is worse with any motion of the shoulder.  Over-the-counter pain medications do not seem to help.  She notes that she has had multiple cortisone injections in the past to the right shoulder without relief of symptoms.  No previous MRI of the right shoulder.  Review of Systems as detailed in HPI.  All others reviewed and are negative.   Objective: Vital Signs: LMP 05/24/2020   Physical Exam well-developed well-nourished female no acute distress.  Alert and oriented x3.  Ortho Exam right shoulder exam reveals approximately 50% range of motion.  She has a positive empty can with decreased range of motion.  4 out of 5 strength throughout.  She has increased pain and weakness with resisted elbow flexion and extension.  She has moderate tenderness to the lateral epicondyle.  Increased pain with supination and pronation as well as flexion extension of the elbow.  She is  neurovascular intact distally.  Specialty Comments:  No specialty comments available.  Imaging: XR Elbow Complete Right (3+View)  Result Date: 06/14/2020 No acute or structural abnormalities  XR Shoulder Right  Result Date: 06/14/2020 No acute or structural abnormalities    PMFS History: Patient Active Problem List   Diagnosis Date Noted  . Arm pain, right 06/11/2020  . Rash and nonspecific skin eruption 05/28/2017  . Skin lesion 05/28/2017  . Abnormal mammogram 01/11/2017  . Contusion of left ankle 10/29/2016  . Mild persistent asthma without complication 08/17/2016  . Suicidal ideation   . Chronic lower back pain 09/24/2014  . History of thyroid disease 02/24/2013  .  ASCUS on Pap smear 02/01/2013  . TOBACCO USER 04/25/2009   Past Medical History:  Diagnosis Date  . Asthma   . Breast mass    fibroadenomas -   . GERD (gastroesophageal reflux disease)   . Kidney problem    Kideney reflux  . Tobacco use   . URI 03/01/2009    Family History  Problem Relation Age of Onset  . Multiple sclerosis Mother   . Cervical cancer Mother   . Breast cancer Maternal Grandmother        unsure of age    Past Surgical History:  Procedure Laterality Date  . BOWEL RESECTION    . CHOLECYSTECTOMY    . Complex trunk closure  2005  . CSF SHUNT  1987  . TRACHEOSTOMY  1987   Social History   Occupational History  . Not on file  Tobacco Use  . Smoking status: Current Every Day Smoker    Packs/day: 1.00    Types: Cigarettes, E-cigarettes  . Smokeless tobacco: Never Used  . Tobacco comment: Also uses ecigarette  Substance and Sexual Activity  . Alcohol use: No    Alcohol/week: 0.0 standard drinks  . Drug use: No  . Sexual activity: Yes    Birth control/protection: None, Pill

## 2020-06-21 ENCOUNTER — Telehealth: Payer: Self-pay

## 2020-06-21 ENCOUNTER — Other Ambulatory Visit: Payer: Self-pay | Admitting: Physician Assistant

## 2020-06-21 NOTE — Telephone Encounter (Signed)
Patient called she is requesting rx refill for tramadol for 21 tablets patient stated the pharmacy told her she can only get 9 tablets she would like a call back call back:[phone]

## 2020-06-21 NOTE — Telephone Encounter (Signed)
PA done through [email or web address] . PENDING AUTH 

## 2020-06-22 NOTE — Telephone Encounter (Signed)
Per pharmacist ins only approved 21 tabs she picked up that Rx and then picked up the other 9 tabs 06/14/20

## 2020-06-30 ENCOUNTER — Ambulatory Visit
Admission: RE | Admit: 2020-06-30 | Discharge: 2020-06-30 | Disposition: A | Discharge status: Home / Self Care | Payer: 59 | Source: Ambulatory Visit | Attending: Orthopaedic Surgery | Admitting: Orthopaedic Surgery

## 2020-06-30 DIAGNOSIS — G8929 Other chronic pain: Secondary | ICD-10-CM

## 2020-06-30 IMAGING — MR MR SHOULDER*R* W/O CM
4 of 5 series · 26 of 40 positions shown · non-contrast
Comparison: X-ray [DATE]

CLINICAL DATA: Right shoulder pain and limited range of motion

EXAM:
MRI OF THE RIGHT SHOULDER WITHOUT CONTRAST
TECHNIQUE: Multiplanar, multisequence MR imaging of the shoulder was performed.
No intravenous contrast was administered.

[Series 3: T2 fat-sat · axial · 4.0mm · 0.27mm/px · z∈[-69,+69]mm · 9 of 30 slices shown (1 of 3)]
[im 1/30]
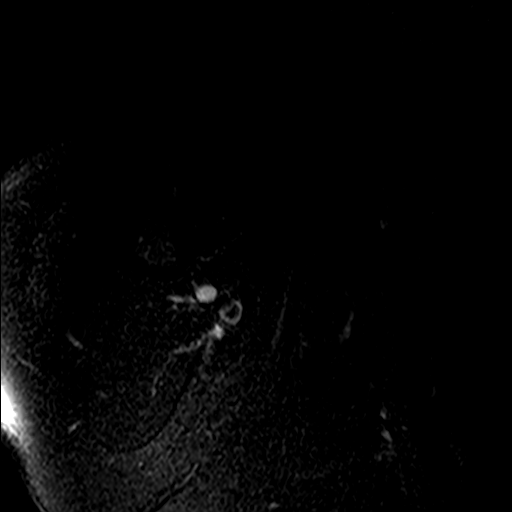
[im 6/30]
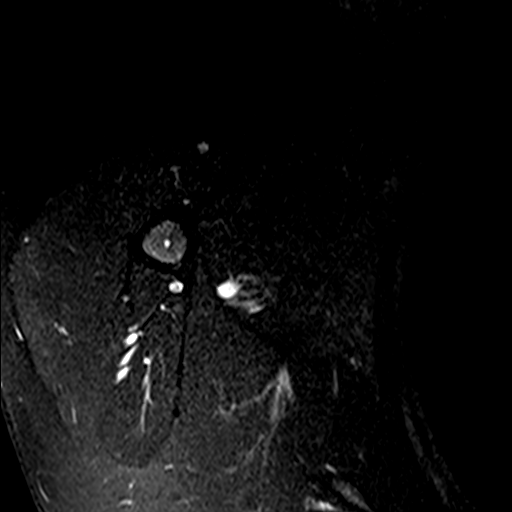
[im 8/30]
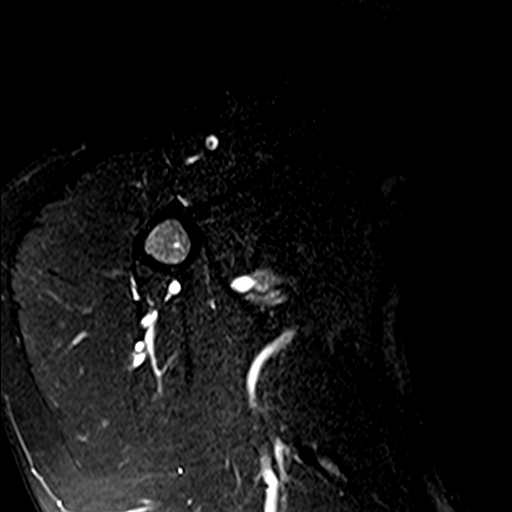
[im 14/30]
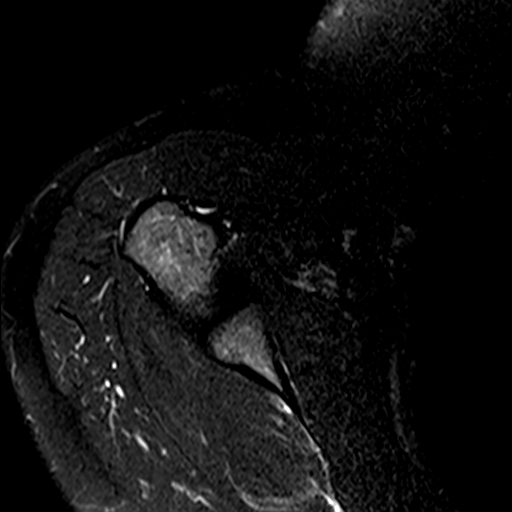
[im 16/30]
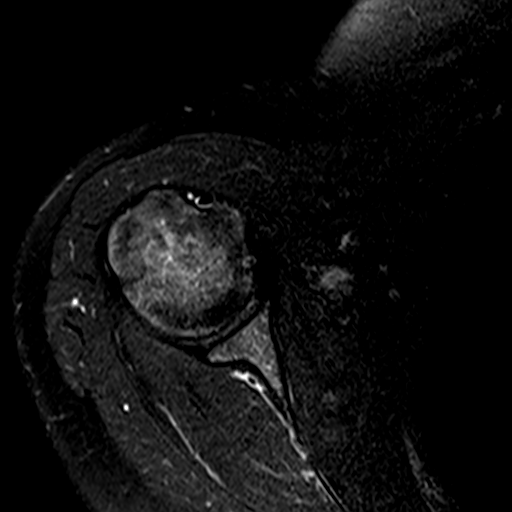
[im 22/30]
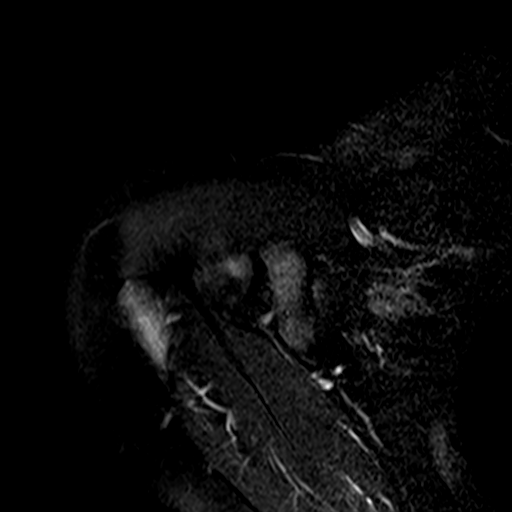
[im 24/30]
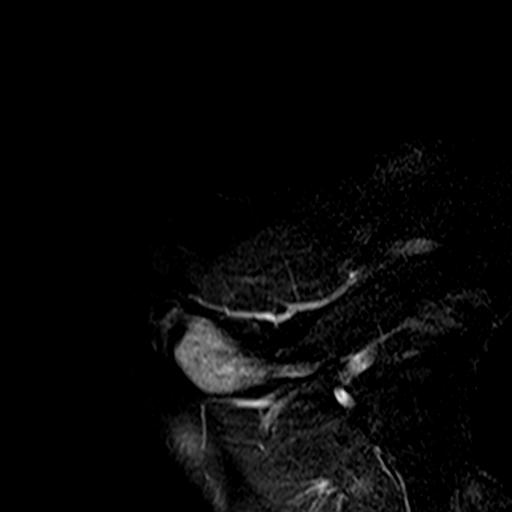
[im 27/30]
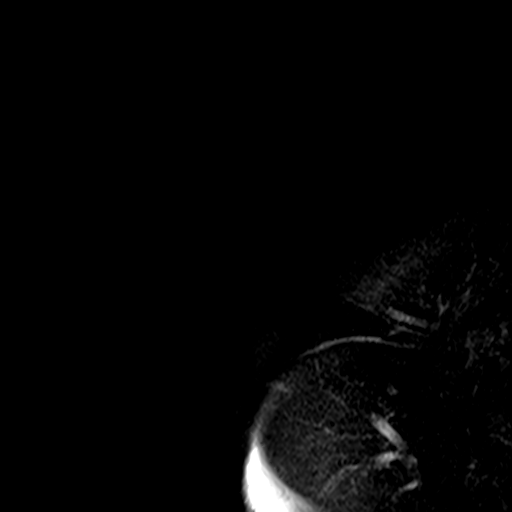
[im 30/30]
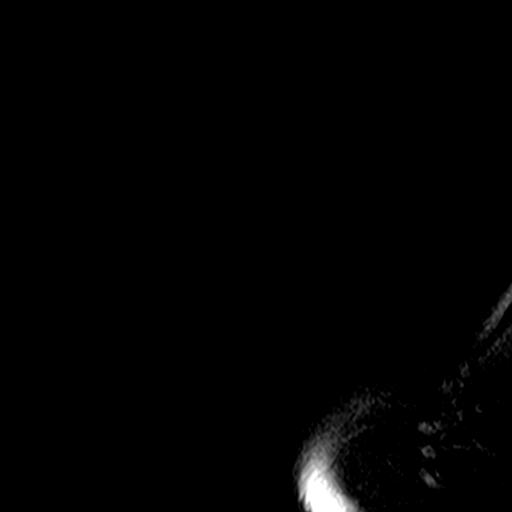

[Series 4: T2 fat-sat · sagittal · 4.0mm · 0.55mm/px · 7 of 18 slices shown (2 of 3)]
[im 1/18]
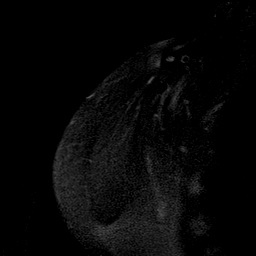
[im 3/18]
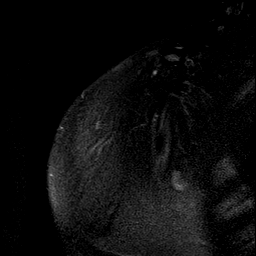
[im 6/18]
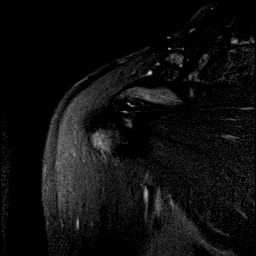
[im 9/18]
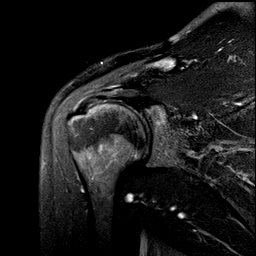
[im 12/18]
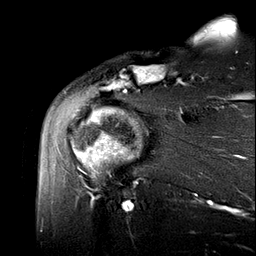
[im 15/18]
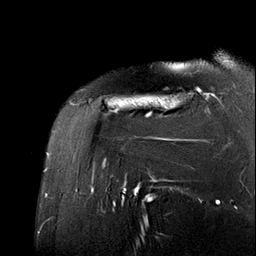
[im 18/18]
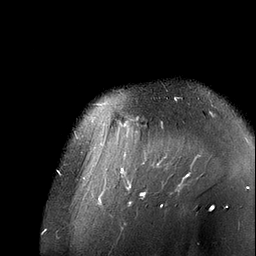

[Series 5: PD · sagittal · 4.0mm · 0.27mm/px · 7 of 18 slices shown]
[im 1/18]
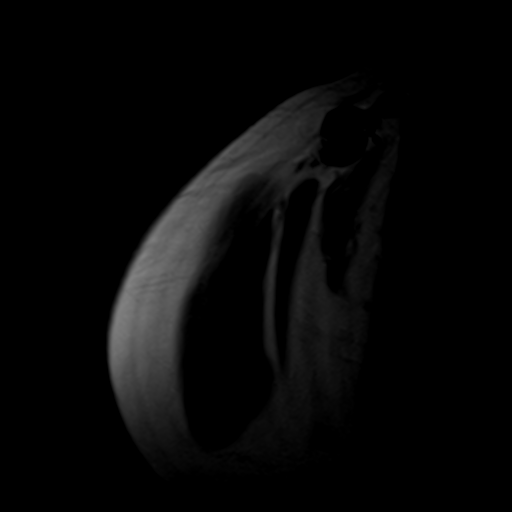
[im 3/18]
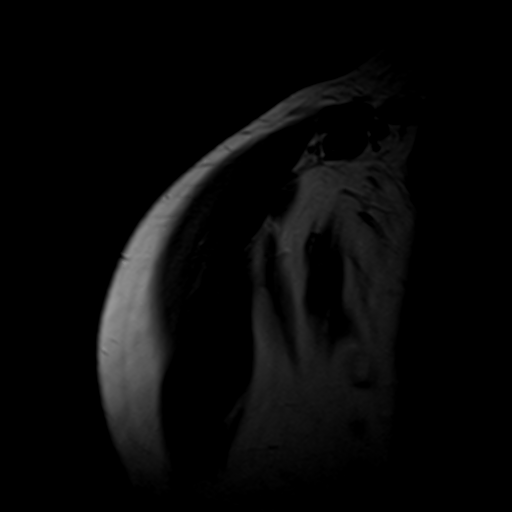
[im 6/18]
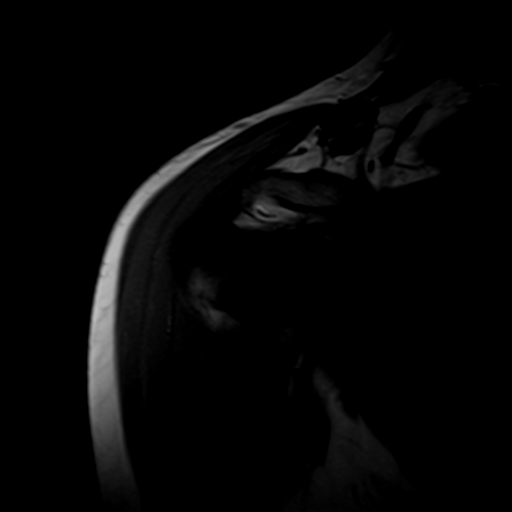
[im 9/18]
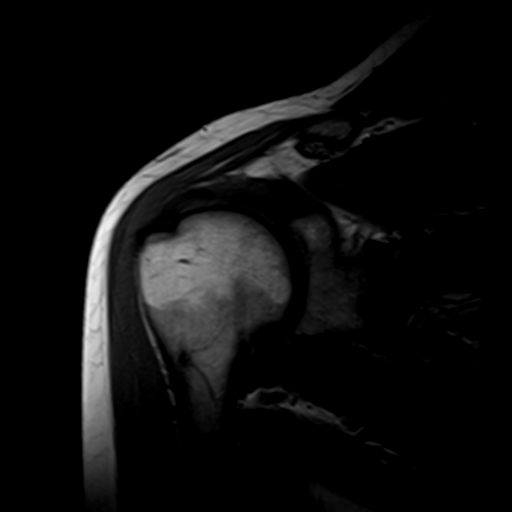
[im 12/18]
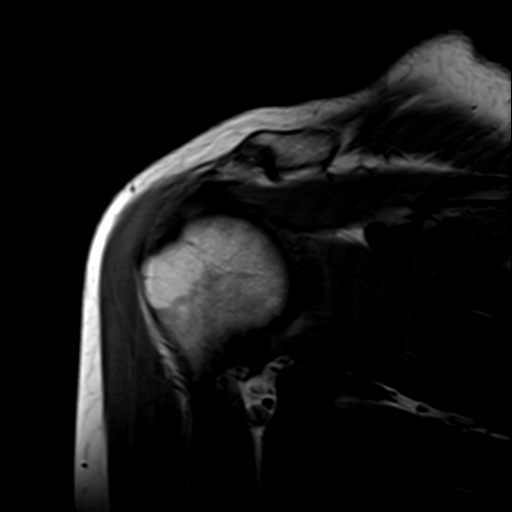
[im 15/18]
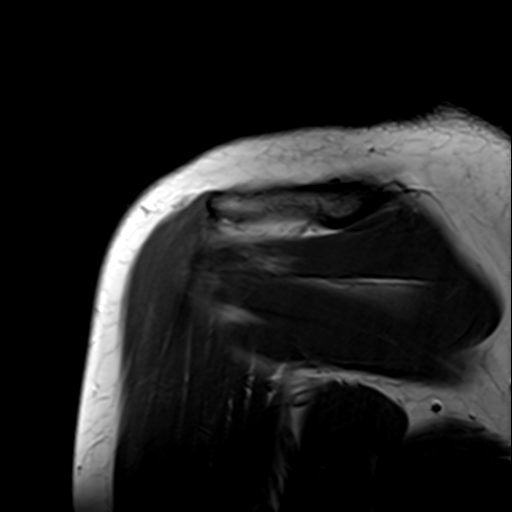
[im 18/18]
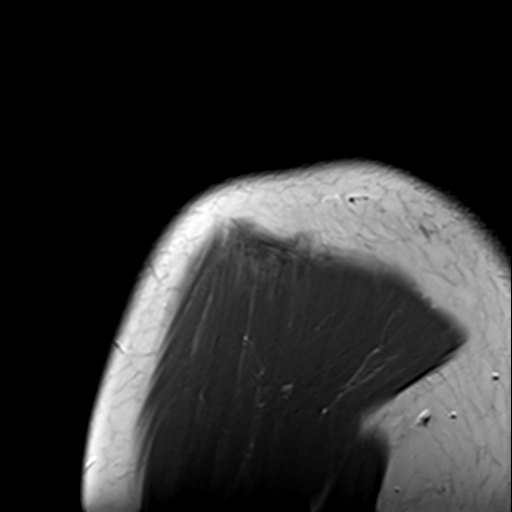

[Series 6: T2 fat-sat · coronal · 4.0mm · 0.55mm/px · 3 of 19 slices shown (3 of 3)]
[im 4/19]
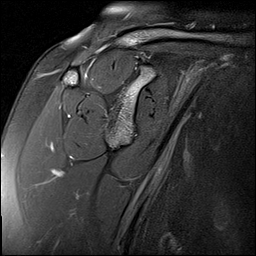
[im 10/19]
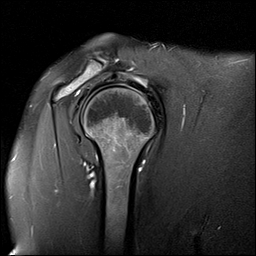
[im 16/19]
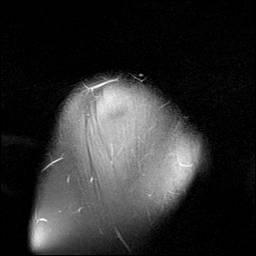

[26 of 40 positions shown; findings below may reference images not displayed]

FINDINGS: Technical note: Mild motion degradation.

Rotator cuff: Intact supraspinatus, infraspinatus, teres minor and
subscapularis tendons.

Muscles: Preserved bulk and signal intensity of the rotator cuff
musculature without edema, atrophy, or fatty infiltration.

Biceps long head: Intact and appropriately located without
tendinosis, tear, or tenosynovitis.

Acromioclavicular Joint: Minimal arthropathy of the AC joint. No
subacromial-subdeltoid bursal fluid.

Glenohumeral Joint: No joint effusion. No chondral defect.

Labrum: Grossly intact, but evaluation is limited by lack of
intraarticular fluid. No paralabral cyst.

Bones: Relative increased T2 signal within the distal clavicle and
acromion. No evidence of fracture or dislocation. No suspicious bone
lesion.

Other: No soft tissue edema or fluid collection.
IMPRESSION: 1. No evidence of internal derangement.  Intact rotator cuff.
2. Minimal AC joint arthropathy.
3. Relative increased T2 signal within the distal clavicle and
acromion, which is likely incidental and may reflect prominent red
marrow given history of smoking. Bone contusion not excluded if
there is history of trauma. This does not have a typical
distribution for distal clavicular osteolysis.

## 2020-07-08 ENCOUNTER — Other Ambulatory Visit: Payer: Self-pay

## 2020-07-08 ENCOUNTER — Ambulatory Visit (INDEPENDENT_AMBULATORY_CARE_PROVIDER_SITE_OTHER): Payer: 59 | Admitting: Family

## 2020-07-08 ENCOUNTER — Encounter: Payer: Self-pay | Admitting: Family

## 2020-07-08 VITALS — BP 128/83 | HR 111 | Ht 60.35 in | Wt 131.4 lb

## 2020-07-08 DIAGNOSIS — Z3201 Encounter for pregnancy test, result positive: Secondary | ICD-10-CM

## 2020-07-08 DIAGNOSIS — S92009K Unspecified fracture of unspecified calcaneus, subsequent encounter for fracture with nonunion: Secondary | ICD-10-CM | POA: Insufficient documentation

## 2020-07-08 DIAGNOSIS — Z7689 Persons encountering health services in other specified circumstances: Secondary | ICD-10-CM

## 2020-07-08 HISTORY — DX: Unspecified fracture of unspecified calcaneus, subsequent encounter for fracture with nonunion: S92.009K

## 2020-07-08 LAB — POCT URINE PREGNANCY: Preg Test, Ur: POSITIVE — AB

## 2020-07-08 NOTE — Progress Notes (Signed)
Establish care Needs pregnancy test  3/3, 3/10,3/17-positive Irregular cycle 02/01-02/04

## 2020-07-08 NOTE — Patient Instructions (Addendum)
Positive pregnancy test.  Referral to Obstetrics/Gynecology. First Trimester of Pregnancy  The first trimester of pregnancy starts on the first day of your last menstrual period until the end of week 12. This is also called months 1 through 3 of pregnancy. Body changes during your first trimester Your body goes through many changes during pregnancy. The changes usually return to normal after your baby is born. Physical changes  You may gain or lose weight.  Your breasts may grow larger and hurt. The area around your nipples may get darker.  Dark spots or blotches may develop on your face.  You may have changes in your hair. Health changes  You may feel like you might vomit (nauseous), and you may vomit.  You may have heartburn.  You may have headaches.  You may have trouble pooping (constipation).  Your gums may bleed. Other changes  You may get tired easily.  You may pee (urinate) more often.  Your menstrual periods will stop.  You may not feel hungry.  You may want to eat certain kinds of food.  You may have changes in your emotions from day to day.  You may have more dreams. Follow these instructions at home: Medicines  Take over-the-counter and prescription medicines only as told by your doctor. Some medicines are not safe during pregnancy.  Take a prenatal vitamin that contains at least 600 micrograms (mcg) of folic acid. Eating and drinking  Eat healthy meals that include: ? Fresh fruits and vegetables. ? Whole grains. ? Good sources of protein, such as meat, eggs, or tofu. ? Low-fat dairy products.  Avoid raw meat and unpasteurized juice, milk, and cheese.  If you feel like you may vomit, or you vomit: ? Eat 4 or 5 small meals a day instead of 3 large meals. ? Try eating a few soda crackers. ? Drink liquids between meals instead of during meals.  You may need to take these actions to prevent or treat trouble pooping: ? Drink enough fluids to  keep your pee (urine) pale yellow. ? Eat foods that are high in fiber. These include beans, whole grains, and fresh fruits and vegetables. ? Limit foods that are high in fat and sugar. These include fried or sweet foods. Activity  Exercise only as told by your doctor. Most people can do their usual exercise routine during pregnancy.  Stop exercising if you have cramps or pain in your lower belly (abdomen) or low back.  Do not exercise if it is too hot or too humid, or if you are in a place of great height (high altitude).  Avoid heavy lifting.  If you choose to, you may have sex unless your doctor tells you not to. Relieving pain and discomfort  Wear a good support bra if your breasts are sore.  Rest with your legs raised (elevated) if you have leg cramps or low back pain.  If you have bulging veins (varicose veins) in your legs: ? Wear support hose as told by your doctor. ? Raise your feet for 15 minutes, 3-4 times a day. ? Limit salt in your food. Safety  Wear your seat belt at all times when you are in a car.  Talk with your doctor if someone is hurting you or yelling at you.  Talk with your doctor if you are feeling sad or have thoughts of hurting yourself. Lifestyle  Do not use hot tubs, steam rooms, or saunas.  Do not douche. Do not use tampons or  scented sanitary pads.  Do not use herbal medicines, illegal drugs, or medicines that are not approved by your doctor. Do not drink alcohol.  Do not smoke or use any products that contain nicotine or tobacco. If you need help quitting, ask your doctor.  Avoid cat litter boxes and soil that is used by cats. These carry germs that can cause harm to the baby and can cause a loss of your baby by miscarriage or stillbirth. General instructions  Keep all follow-up visits. This is important.  Ask for help if you need counseling or if you need help with nutrition. Your doctor can give you advice or tell you where to go for  help.  Visit your dentist. At home, brush your teeth with a soft toothbrush. Floss gently.  Write down your questions. Take them to your prenatal visits. Where to find more information  American Pregnancy Association: americanpregnancy.org  [employer]: www.acog.org  Office on Women's Health: MightyRew[employer]co.nz Contact a doctor if:  You are dizzy.  You have a fever.  You have mild cramps or pressure in your lower belly.  You have a nagging pain in your belly area.  You continue to feel like you may vomit, you vomit, or you have watery poop (diarrhea) for 24 hours or longer.  You have a bad-smelling fluid coming from your vagina.  You have pain when you pee.  You are exposed to a disease that spreads from person to person, such as chickenpox, measles, Zika virus, HIV, or hepatitis. Get help right away if:  You have spotting or bleeding from your vagina.  You have very bad belly cramping or pain.  You have shortness of breath or chest pain.  You have any kind of injury, such as from a fall or a car crash.  You have new or increased pain, swelling, or redness in an arm or leg. Summary  The first trimester of pregnancy starts on the first day of your last menstrual period until the end of week 12 (months 1 through 3).  Eat 4 or 5 small meals a day instead of 3 large meals.  Do not smoke or use any products that contain nicotine or tobacco. If you need help quitting, ask your doctor.  Keep all follow-up visits. This information is not intended to replace advice given to you by your health care provider. Make sure you discuss any questions you have with your health care provider. Document Revised: 09/16/2019 Document Reviewed: 07/23/2019 Elsevier Patient Education  2021 [employer].

## 2020-07-08 NOTE — Progress Notes (Signed)
Subjective:    Ann Brown - 35 y.o. female MRN [id number]  Date of birth: 1985-11-16  HPI  Ann Brown is to establish care. Patient has a PMH significant for mild persistent asthma without complication, rash and nonspecific skin eruption, skin lesion, suicidal ideation, contusion left ankle, abnormal mammogram, right arm pain, ASCUS on PAP smear, history of thyroid disease, and chronic lower back pain.   Current issues and/or concerns: 1. Missed Menses/ Possible Pregnancy: Patient complains of amenorrhea. She believes she could be pregnant. Pregnancy was unplanned. Current symptoms also include breast tendernessand heartburn. Last period was abnormal. Length of LMP: 3 days. Usually 7 days. How many previous pregnancies: 2 How many children: An 18 year-old and a 41 year-old. Have you taken home pregnancy test: Yes LMP: 05/24/2020   ROS per HPI   Health Maintenance:  Health Maintenance Due  Topic Date Due  . Hepatitis C Screening  Never done  . COVID-19 Vaccine (1) Never done  . MAMMOGRAM  05/01/2018  . PAP SMEAR-Modifier  02/22/2019  . INFLUENZA VACCINE  11/22/2019    Past Medical History: Patient Active Problem List   Diagnosis Date Noted  . 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 Arm pain, right 06/11/2020  . Mass of mediastinum 06/30/2019  . Rash and nonspecific skin eruption 05/28/2017  . Skin lesion 05/28/2017  . Abnormal mammogram 01/11/2017  . Contusion of left ankle 10/29/2016  . Mild persistent asthma without complication 08/17/2016  . Suicidal ideation   . Chronic lower back pain 09/24/2014  . History of thyroid disease 02/24/2013  . ASCUS on Pap smear 02/01/2013  . TOBACCO USER 04/25/2009    Social History   reports that she has been smoking cigarettes and e-cigarettes. She has been smoking about 1.00 pack per day. She has never used smokeless tobacco. She reports that she does not drink alcohol and does not use drugs.   Family  History  family history includes Breast cancer in her maternal grandmother; Cervical cancer in her mother; Multiple sclerosis in her mother.   Medications: reviewed and updated   Objective:   Physical Exam BP 128/83 (BP Location: Left Arm, Patient Position: Sitting)   Pulse (!) 111   Ht 5' 0.35" (1.533 m)   Wt 131 lb 6.4 oz (59.6 kg)   LMP 05/24/2020   SpO2 98%   BMI 25.36 kg/m  Physical Exam HENT:     Head: Normocephalic and atraumatic.  Eyes:     Extraocular Movements: Extraocular movements intact.     Conjunctiva/sclera: Conjunctivae normal.     Pupils: Pupils are equal, round, and reactive to light.  Cardiovascular:     Rate and Rhythm: Tachycardia present.     Pulses: Normal pulses.     Heart sounds: Normal heart sounds.  Pulmonary:     Effort: Pulmonary effort is normal.     Breath sounds: Normal breath sounds.  Musculoskeletal:     Cervical back: Normal range of motion and neck supple.  Neurological:     General: No focal deficit present.     Mental Status: She is alert and oriented to person, place, and time.  Psychiatric:        Mood and Affect: Mood normal.        Behavior: Behavior normal.      Assessment & Plan:  1. Encounter to establish care: - Patient presents today to establish care.   2. Pregnancy test positive: - Positive pregnancy test.  - LMP: 05/24/2020. -  EDD: 02/28/2021. - Referral to Obstetrics / Gynecology for further evaluation and management.  - POCT urine pregnancy - Ambulatory referral to Obstetrics / Gynecology    Patient was given clear instructions to go to Emergency Department or return to medical center if symptoms don't improve, worsen, or new problems develop.The patient verbalized understanding.  I discussed the assessment and treatment plan with the patient. The patient was provided an opportunity to ask questions and all were answered. The patient agreed with the plan and demonstrated an understanding of the  instructions.   The patient was advised to call back or seek an in-person evaluation if the symptoms worsen or if the condition fails to improve as anticipated.    Ricky Stabs, NP 07/10/2020, 11:42 AM Primary Care at [facility]

## 2020-07-12 ENCOUNTER — Encounter: Payer: Self-pay | Admitting: Orthopaedic Surgery

## 2020-07-12 ENCOUNTER — Ambulatory Visit (INDEPENDENT_AMBULATORY_CARE_PROVIDER_SITE_OTHER): Payer: 59 | Admitting: Orthopaedic Surgery

## 2020-07-12 DIAGNOSIS — G8929 Other chronic pain: Secondary | ICD-10-CM

## 2020-07-12 DIAGNOSIS — M25511 Pain in right shoulder: Secondary | ICD-10-CM | POA: Diagnosis not present

## 2020-07-12 NOTE — Progress Notes (Signed)
Office Visit Note   Patient: Ann Brown           Date of Birth: Aug 22, 1985           MRN: [id number] Visit Date: 07/12/2020              Requested by: Melene Plan, MD [address] [place],  [place] [address] PCP: Rema Fendt, NP   Assessment & Plan: Visit Diagnoses:  1. Chronic right shoulder pain     Plan: MRI does not show any impressive findings other than some increased marrow signal of the [facility] joint.  Patient has tried medications as well as physical therapy and she states that she had injections by Dr. Berline Chough in the past with temporary relief.  I am not exactly sure as to what is the source of her pain therefore I have recommended a second opinion with Dr. August Saucer in the near future.  Follow-up as needed.  Follow-Up Instructions: Return for needs second opinion with Dr. August Saucer for shoulder pain.   Orders:  No orders of the defined types were placed in this encounter.  No orders of the defined types were placed in this encounter.     Procedures: No procedures performed   Clinical Data: No additional findings.   Subjective: Chief Complaint  Patient presents with   Right Shoulder - Pain    [name] returns today for MRI review of the right shoulder.  Reports no changes.   Review of Systems  Constitutional: Negative.   HENT: Negative.   Eyes: Negative.   Respiratory: Negative.   Cardiovascular: Negative.   Endocrine: Negative.   Musculoskeletal: Negative.   Neurological: Negative.   Hematological: Negative.   Psychiatric/Behavioral: Negative.   All other systems reviewed and are negative.    Objective: Vital Signs: LMP 05/24/2020   Physical Exam Vitals and nursing note reviewed.  Constitutional:      Appearance: She is well-developed.  Pulmonary:     Effort: Pulmonary effort is normal.  Skin:    General: Skin is warm.     Capillary Refill: Capillary refill takes less than 2 seconds.  Neurological:     Mental Status: She is alert  and oriented to person, place, and time.  Psychiatric:        Behavior: Behavior normal.        Thought Content: Thought content normal.        Judgment: Judgment normal.     Ortho Exam Right shoulder shows normal range of motion.  She has slight tenderness to the [facility] joint.  Strength is normal. Specialty Comments:  No specialty comments available.  Imaging: No results found.   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   Arm pain, right 06/11/2020   Mass of mediastinum 06/30/2019   Rash and nonspecific skin eruption 05/28/2017   Skin lesion 05/28/2017   Abnormal mammogram 01/11/2017   Contusion of left ankle 10/29/2016   Mild persistent asthma without complication 08/17/2016   Suicidal ideation    Chronic lower back pain 09/24/2014   History of thyroid disease 02/24/2013   ASCUS on Pap smear 02/01/2013   TOBACCO USER 04/25/2009   Past Medical History:  Diagnosis Date   Asthma    Breast mass    fibroadenomas -    GERD (gastroesophageal reflux disease)    Kidney problem    Kideney reflux   Tobacco use  URI 03/01/2009    Family History  Problem Relation Age of Onset   Multiple sclerosis Mother    Cervical cancer Mother    Breast cancer Maternal Grandmother        unsure of age    Past Surgical History:  Procedure Laterality Date   BOWEL RESECTION     CHOLECYSTECTOMY     Complex trunk closure  2005   CSF SHUNT  1987   TRACHEOSTOMY  1987   Social History   Occupational History   Not on file  Tobacco Use   Smoking status: Current Every Day Smoker    Packs/day: 1.00    Types: Cigarettes, E-cigarettes   Smokeless tobacco: Never Used   Tobacco comment: Also uses ecigarette  Vaping Use   Vaping Use: Never used  Substance and Sexual Activity   Alcohol use: No    Alcohol/week: 0.0 standard drinks   Drug use: No   Sexual activity: Yes    Birth  control/protection: None, Pill

## 2020-07-29 ENCOUNTER — Ambulatory Visit: Payer: 59 | Admitting: Orthopedic Surgery

## 2020-08-04 ENCOUNTER — Other Ambulatory Visit (HOSPITAL_COMMUNITY)
Admission: RE | Admit: 2020-08-04 | Discharge: 2020-08-04 | Disposition: A | Discharge status: Home / Self Care | Payer: 59 | Source: Ambulatory Visit | Attending: Obstetrics and Gynecology | Admitting: Obstetrics and Gynecology

## 2020-08-04 ENCOUNTER — Ambulatory Visit (INDEPENDENT_AMBULATORY_CARE_PROVIDER_SITE_OTHER): Payer: 59 | Admitting: Obstetrics and Gynecology

## 2020-08-04 ENCOUNTER — Encounter: Payer: Self-pay | Admitting: Obstetrics and Gynecology

## 2020-08-04 VITALS — BP 120/81 | HR 108 | Wt 131.7 lb

## 2020-08-04 DIAGNOSIS — O0990 Supervision of high risk pregnancy, unspecified, unspecified trimester: Secondary | ICD-10-CM | POA: Insufficient documentation

## 2020-08-04 DIAGNOSIS — O0991 Supervision of high risk pregnancy, unspecified, first trimester: Secondary | ICD-10-CM

## 2020-08-04 DIAGNOSIS — Z3A10 10 weeks gestation of pregnancy: Secondary | ICD-10-CM | POA: Insufficient documentation

## 2020-08-04 DIAGNOSIS — O09523 Supervision of elderly multigravida, third trimester: Secondary | ICD-10-CM | POA: Insufficient documentation

## 2020-08-04 DIAGNOSIS — O09521 Supervision of elderly multigravida, first trimester: Secondary | ICD-10-CM

## 2020-08-04 DIAGNOSIS — J4530 Mild persistent asthma, uncomplicated: Secondary | ICD-10-CM

## 2020-08-04 NOTE — Progress Notes (Signed)
Complains of cramps feeling in pelvic when she stands

## 2020-08-04 NOTE — Progress Notes (Signed)
INITIAL PRENATAL VISIT NOTE  Subjective:  Ann Brown is a 35 y.o. [phone] at [redacted]w[redacted]d by LMP being seen today for her initial prenatal visit. She has an obstetric history significant for vaginal laceration and possible asymetric pelvis. She has a medical history significant for tobacco use and ADHD.  Patient reports no complaints.  Contractions: Irritability. Vag. Bleeding: None.  Movement: Present. Denies leaking of fluid.    Past Medical History:  Diagnosis Date  . Asthma   . Breast mass    fibroadenomas -   . GERD (gastroesophageal reflux disease)   . Kidney problem    Kideney reflux  . Tobacco use   . URI 03/01/2009    Past Surgical History:  Procedure Laterality Date  . BOWEL RESECTION    . CHOLECYSTECTOMY    . Complex trunk closure  2005  . CSF SHUNT  1987  . TRACHEOSTOMY  1987    OB History  Gravida Para Term Preterm AB Living  4 2 2  0 1 2  SAB IAB Ectopic Multiple Live Births  0 0 0   2    # Outcome Date GA Lbr Len/2nd Weight Sex Delivery Anes PTL Lv  4 Current           3 Term 06/29/13 [redacted]w[redacted]d 14:17 / 01:30 7 lb 9.2 oz (3.435 kg) M Vag-Spont EPI  LIV  2 Term 05/03/09 [redacted]w[redacted]d  5 lb 10 oz (2.551 kg) F Vag-Spont EPI  LIV     Birth Comments: GDM, PTL  born in [facility]  1 AB 2003            Social History   Socioeconomic History  . Marital status: Single    Spouse name: Not on file  . Number of children: Not on file  . Years of education: Not on file  . Highest education level: Not on file  Occupational History  . Not on file  Tobacco Use  . Smoking status: Current Every Day Smoker    Packs/day: 1.00    Types: Cigarettes, E-cigarettes  . Smokeless tobacco: Never Used  . Tobacco comment: Also uses ecigarette  Vaping Use  . Vaping Use: Never used  Substance and Sexual Activity  . Alcohol use: No    Alcohol/week: 0.0 standard drinks  . Drug use: No  . Sexual activity: Yes    Birth control/protection: None, Pill  Other Topics Concern  . Not on file   Social History Narrative   ** Merged History Encounter **       Social Determinants of Health   Financial Resource Strain: Not on file  Food Insecurity: Not on file  Transportation Needs: Not on file  Physical Activity: Not on file  Stress: Not on file  Social Connections: Not on file    Family History  Problem Relation Age of Onset  . Multiple sclerosis Mother   . Cervical cancer Mother   . Breast cancer Maternal Grandmother        unsure of age     Current Outpatient Medications:  .  albuterol (PROVENTIL) (2.5 MG/3ML) 0.083% nebulizer solution, Take 3 mLs (2.5 mg total) by nebulization every 6 (six) hours as needed for wheezing or shortness of breath., Disp: 75 mL, Rfl: 0 .  albuterol (VENTOLIN HFA) 108 (90 Base) MCG/ACT inhaler, Inhale 1-2 puffs into the lungs every 6 (six) hours as needed for wheezing or shortness of breath., Disp: 18 g, Rfl: 0 .  amoxicillin-clavulanate (AUGMENTIN) 875-125 MG tablet, Take 1  tablet by mouth 2 (two) times daily., Disp: , Rfl:  .  amphetamine-dextroamphetamine (ADDERALL) 20 MG tablet, Take 20 mg by mouth 2 (two) times daily., Disp: , Rfl:  .  Budeson-Glycopyrrol-Formoterol (BREZTRI AEROSPHERE) 160-9-4.8 MCG/ACT AERO, Inhale 1-2 puffs into the lungs in the morning and at bedtime., Disp: 10.7 g, Rfl: 0 .  buPROPion (WELLBUTRIN SR) 100 MG 12 hr tablet, take 1 tablet by oral route every morning, Disp: , Rfl:  .  busPIRone (BUSPAR) 10 MG tablet, Take 10 mg by mouth., Disp: , Rfl:  .  busPIRone (BUSPAR) 5 MG tablet, take 1 tab po q am and 1 tab po q hs, Disp: , Rfl:  .  busPIRone (BUSPAR) 5 MG tablet, Take 5 mg by mouth 2 (two) times daily., Disp: , Rfl:  .  hydrOXYzine (ATARAX/VISTARIL) 10 MG tablet, Take one (1) tablet by mouth at bedtime, as needed for insomnia, Disp: , Rfl:  .  esomeprazole (NEXIUM) 40 MG capsule, TAKE 1 CAPSULE BY MOUTH EVERY DAY (Patient not taking: Reported on 08/04/2020), Disp: 30 capsule, Rfl: 3 .  traMADol (ULTRAM) 50 MG  tablet, Take 1 tablet (50 mg total) by mouth 3 (three) times daily as needed. (Patient not taking: Reported on 08/04/2020), Disp: 30 tablet, Rfl: 0  Allergies  Allergen Reactions  . Other   . Ketorolac Other (See Comments)    Stomach pains     Review of Systems: Negative except for what is mentioned in HPI.  Objective:   Vitals:   08/04/20 1529  BP: 120/81  Pulse: (!) 108  Weight: 131 lb 11.2 oz (59.7 kg)    Fetal Status:     Movement: Present     Physical Exam: BP 120/81   Pulse (!) 108   Wt 131 lb 11.2 oz (59.7 kg)   LMP 05/24/2020 (Exact Date)   BMI 25.42 kg/m  CONSTITUTIONAL: Well-developed, well-nourished female in no acute distress.  NEUROLOGIC: Alert and oriented to person, place, and time. Normal reflexes, muscle tone coordination. No cranial nerve deficit noted. PSYCHIATRIC: Normal mood and affect. Normal behavior. Normal judgment and thought content. SKIN: Skin is warm and dry. No rash noted. Not diaphoretic. No erythema. No pallor. HENT:  Normocephalic, atraumatic, External right and left ear normal. Oropharynx is clear and moist EYES: Conjunctivae and EOM are normal. Pupils are equal, round, and reactive to light. No scleral icterus.  NECK: Normal range of motion, supple, no masses CARDIOVASCULAR: Normal heart rate noted, regular rhythm RESPIRATORY: Effort and breath sounds normal, no problems with respiration noted BREASTS:deferred ABDOMEN: Soft, nontender, nondistended, gravid. GU: normal appearing external female genitalia, multiparous normal appearing cervix, scant white discharge in vagina, no lesions noted Bimanual: 10 weeks sized uterus, no adnexal tenderness or palpable lesions noted, asymmetric pelvis, left side encroaches into the vaginq MUSCULOSKELETAL: Normal range of motion. EXT:  No edema and no tenderness. 2+ distal pulses.   Assessment and Plan:  Pregnancy: [id number] at [redacted]w[redacted]d by LMP  1. Supervision of high risk pregnancy, antepartum Routine  care and labs.  Med list reviewed and is compatible with pregnancy - CBC/D/Plt+RPR+Rh+ABO+Rub Ab... - Hemoglobin A1c - Genetic Screening - [facility] AMB BABYSCRIPTS SCHEDULE OPTIMIZATION - Culture, OB Urine - [place] MFM OB COMP + 14 WK; Future - Cytology - PAP( [place])  2. [redacted] weeks gestation of pregnancy   3. Mild persistent asthma without complication   4. Advanced maternal age in multigravida, first trimester MFM scan scheduled 10/04/20   Preterm labor symptoms and general obstetric  precautions including but not limited to vaginal bleeding, contractions, leaking of fluid and fetal movement were reviewed in detail with the patient.  Please refer to After Visit Summary for other counseling recommendations.   Return in about 4 weeks (around 09/01/2020) for ROB, in person.  Warden Fillers 08/04/2020 5:08 PM

## 2020-08-05 LAB — CBC/D/PLT+RPR+RH+ABO+RUB AB...
Antibody Screen: NEGATIVE
Basophils Absolute: 0 10*3/uL (ref 0.0–0.2)
Basos: 0 %
EOS (ABSOLUTE): 0 10*3/uL (ref 0.0–0.4)
Eos: 0 %
HCV Ab: <0.1 s/co ratio (ref 0.0–0.9)
HIV Screen 4th Generation wRfx: NONREACTIVE
Hematocrit: 40 % (ref 34.0–46.6)
Hemoglobin: 14.1 g/dL (ref 11.1–15.9)
Hepatitis B Surface Ag: NEGATIVE
Immature Grans (Abs): 0 10*3/uL (ref 0.0–0.1)
Immature Granulocytes: 0 %
Lymphocytes Absolute: 3.3 10*3/uL — ABNORMAL HIGH (ref 0.7–3.1)
Lymphs: 28 %
MCH: 35.5 pg — ABNORMAL HIGH (ref 26.6–33.0)
MCHC: 35.3 g/dL (ref 31.5–35.7)
MCV: 101 fL — ABNORMAL HIGH (ref 79–97)
Monocytes Absolute: 0.7 10*3/uL (ref 0.1–0.9)
Monocytes: 6 %
Neutrophils Absolute: 8 10*3/uL — ABNORMAL HIGH (ref 1.4–7.0)
Neutrophils: 66 %
Platelets: 251 10*3/uL (ref 150–450)
RBC: 3.97 x10E6/uL (ref 3.77–5.28)
RDW: 11.8 % (ref 11.7–15.4)
RPR Ser Ql: NONREACTIVE
Rh Factor: POSITIVE
Rubella Antibodies, IGG: 7.14 index (ref >0.99)
WBC: 12.1 10*3/uL — ABNORMAL HIGH (ref 3.4–10.8)

## 2020-08-05 LAB — CYTOLOGY - PAP
Chlamydia: NEGATIVE
Comment: NEGATIVE
Comment: NEGATIVE
Comment: NORMAL
Diagnosis: NEGATIVE
High risk HPV: NEGATIVE
Neisseria Gonorrhea: NEGATIVE

## 2020-08-05 LAB — HEMOGLOBIN A1C
Est. average glucose Bld gHb Est-mCnc: 100 mg/dL
Hgb A1c MFr Bld: 5.1 % (ref 4.8–5.6)

## 2020-08-05 LAB — HCV INTERPRETATION

## 2020-08-06 LAB — CULTURE, OB URINE

## 2020-08-06 LAB — URINE CULTURE, OB REFLEX

## 2020-08-08 ENCOUNTER — Telehealth: Payer: Self-pay | Admitting: *Deleted

## 2020-08-08 ENCOUNTER — Encounter: Payer: Self-pay | Admitting: *Deleted

## 2020-08-08 DIAGNOSIS — O0990 Supervision of high risk pregnancy, unspecified, unspecified trimester: Secondary | ICD-10-CM

## 2020-08-08 MED ORDER — BLOOD PRESSURE KIT DEVI: 1.0000 | 0 refills | Status: DC | PRN

## 2020-08-08 NOTE — Telephone Encounter (Signed)
[place] left a voice message 08/05/20 am stating she saw a doctor yesterday and was calling to see where the bp cuff rx was sent. Per chart do not see RX sent for bp cuff. I called [place] and we discussed I do not see bp cuff rx sent ; but I will send today. We discussed her insurance is [employer]. I explained may not be covered by her insurance. I asked her if it is not covered , she can buy one if she can afford it, and if she can not afford to let us know so we can get one one another way. I explained if it is covered to bring to next ob appointment and we will show her how to use it. She voices understanding. Elvira Langston,RN

## 2020-08-08 NOTE — Telephone Encounter (Signed)
Patient called and left message for nurse to call her about bp cuff. I called [place] and she confirms she called [facility] and her insurance [facility]) does not cover BP cuff RX and that she can't buy one. I informed her I will put a note in her chart to give her a cuff at her next ob appointment if we have some available ( on 09/03/20) . She voices understanding. Decarlo Rivet,RN

## 2020-09-01 ENCOUNTER — Encounter: Payer: Self-pay | Admitting: Obstetrics and Gynecology

## 2020-09-01 ENCOUNTER — Other Ambulatory Visit: Payer: Self-pay

## 2020-09-01 ENCOUNTER — Ambulatory Visit (INDEPENDENT_AMBULATORY_CARE_PROVIDER_SITE_OTHER): Payer: 59 | Admitting: Obstetrics and Gynecology

## 2020-09-01 VITALS — BP 119/71 | HR 98 | Wt 136.4 lb

## 2020-09-01 DIAGNOSIS — K219 Gastro-esophageal reflux disease without esophagitis: Secondary | ICD-10-CM

## 2020-09-01 DIAGNOSIS — Z3A14 14 weeks gestation of pregnancy: Secondary | ICD-10-CM

## 2020-09-01 DIAGNOSIS — O0990 Supervision of high risk pregnancy, unspecified, unspecified trimester: Secondary | ICD-10-CM

## 2020-09-01 MED ORDER — PANTOPRAZOLE SODIUM 20 MG PO TBEC: 20.0000 mg | DELAYED_RELEASE_TABLET | Freq: Two times a day (BID) | ORAL | 2 refills | Status: DC

## 2020-09-06 NOTE — Progress Notes (Signed)
   PRENATAL VISIT NOTE  Subjective:  Ann Brown is a 35 y.o. [phone] at 14w02 being seen today for ongoing prenatal care.  She is currently monitored for the following issues for this high-risk pregnancy and has TOBACCO USER; ASCUS on Pap smear; History of thyroid disease; Chronic lower back pain; Mild persistent asthma without complication; Abnormal mammogram; Mass of mediastinum; Supervision of high risk pregnancy, antepartum; and Advanced maternal age in multigravida, first trimester on their problem list.  Patient reports no complaints.  Contractions: Not present. Vag. Bleeding: None.  Movement: Absent. Denies leaking of fluid.   The following portions of the patient's history were reviewed and updated as appropriate: allergies, current medications, past family history, past medical history, past social history, past surgical history and problem list.   Objective:   Vitals:   09/01/20 1433  BP: 119/71  Pulse: 98  Weight: 136 lb 6.4 oz (61.9 kg)    Fetal Status: Fetal Heart Rate (bpm): 154   Movement: Absent     General:  Alert, oriented and cooperative. Patient is in no acute distress.  Skin: Skin is warm and dry. No rash noted.   Cardiovascular: Normal heart rate noted  Respiratory: Normal respiratory effort, no problems with respiration noted  Abdomen: Soft, gravid, appropriate for gestational age.  Pain/Pressure: Present     Pelvic: Cervical exam deferred        Extremities: Normal range of motion.  Edema: Trace  Mental Status: Normal mood and affect. Normal behavior. Normal judgment and thought content.   Assessment and Plan:  Pregnancy: [id number] at [redacted]w[redacted]d 1. [redacted] weeks gestation of pregnancy  2. Supervision of high risk pregnancy, antepartum Routine care. Low risk panorama but horizon not run. I d/w her re: this and she is amenable to re-draw but I told her I'd recommend doing it nv so she can do an afp, too, which she is fine with  Anatomy u/s scheduled for  6/14  3. Gastroesophageal reflux disease, unspecified whether esophagitis present protonix sent in  Preterm labor symptoms and general obstetric precautions including but not limited to vaginal bleeding, contractions, leaking of fluid and fetal movement were reviewed in detail with the patient. Please refer to After Visit Summary for other counseling recommendations.   Return in about 1 month (around 10/04/2020) for in person, high risk, md visit.  Future Appointments  Date Time Provider Department Center  10/04/2020  9:35 AM Venora Maples, MD [facility]  10/04/2020 10:15 AM [facility] US2 [facility]    [place] Bing, MD

## 2020-10-03 ENCOUNTER — Telehealth: Payer: Self-pay

## 2020-10-03 ENCOUNTER — Other Ambulatory Visit: Payer: Self-pay | Admitting: Obstetrics and Gynecology

## 2020-10-03 DIAGNOSIS — O0990 Supervision of high risk pregnancy, unspecified, unspecified trimester: Secondary | ICD-10-CM

## 2020-10-03 DIAGNOSIS — O09522 Supervision of elderly multigravida, second trimester: Secondary | ICD-10-CM

## 2020-10-03 NOTE — Telephone Encounter (Signed)
mar/lm for patient to confirm appt information for 10/04/2020-arrive@10a , may have one visitor 13+; no children.

## 2020-10-04 ENCOUNTER — Ambulatory Visit (INDEPENDENT_AMBULATORY_CARE_PROVIDER_SITE_OTHER): Payer: 59 | Admitting: Family Medicine

## 2020-10-04 ENCOUNTER — Encounter: Payer: Self-pay | Admitting: Family Medicine

## 2020-10-04 ENCOUNTER — Ambulatory Visit: Payer: 59 | Attending: Obstetrics and Gynecology

## 2020-10-04 ENCOUNTER — Other Ambulatory Visit: Payer: Self-pay

## 2020-10-04 ENCOUNTER — Encounter: Payer: Self-pay | Admitting: *Deleted

## 2020-10-04 ENCOUNTER — Other Ambulatory Visit: Payer: Self-pay | Admitting: *Deleted

## 2020-10-04 ENCOUNTER — Ambulatory Visit: Payer: 59 | Admitting: *Deleted

## 2020-10-04 VITALS — BP 113/68 | HR 87

## 2020-10-04 VITALS — BP 113/75 | HR 98 | Wt 135.0 lb

## 2020-10-04 DIAGNOSIS — O99332 Smoking (tobacco) complicating pregnancy, second trimester: Secondary | ICD-10-CM

## 2020-10-04 DIAGNOSIS — O0990 Supervision of high risk pregnancy, unspecified, unspecified trimester: Secondary | ICD-10-CM

## 2020-10-04 DIAGNOSIS — Z8639 Personal history of other endocrine, nutritional and metabolic disease: Secondary | ICD-10-CM

## 2020-10-04 DIAGNOSIS — F17210 Nicotine dependence, cigarettes, uncomplicated: Secondary | ICD-10-CM | POA: Diagnosis not present

## 2020-10-04 DIAGNOSIS — O09521 Supervision of elderly multigravida, first trimester: Secondary | ICD-10-CM

## 2020-10-04 DIAGNOSIS — Z362 Encounter for other antenatal screening follow-up: Secondary | ICD-10-CM

## 2020-10-04 DIAGNOSIS — O09512 Supervision of elderly primigravida, second trimester: Secondary | ICD-10-CM

## 2020-10-04 DIAGNOSIS — F17200 Nicotine dependence, unspecified, uncomplicated: Secondary | ICD-10-CM

## 2020-10-04 DIAGNOSIS — Z363 Encounter for antenatal screening for malformations: Secondary | ICD-10-CM | POA: Diagnosis not present

## 2020-10-04 DIAGNOSIS — Z3A19 19 weeks gestation of pregnancy: Secondary | ICD-10-CM

## 2020-10-04 IMAGING — US US MFM OB DETAIL+14 WK
1 series · 13 of 28 positions shown · non-contrast
Comparison: none

[Series 1: us mfm ob detail+14 wk · 13 of 175 slices shown]
[im 7/175]
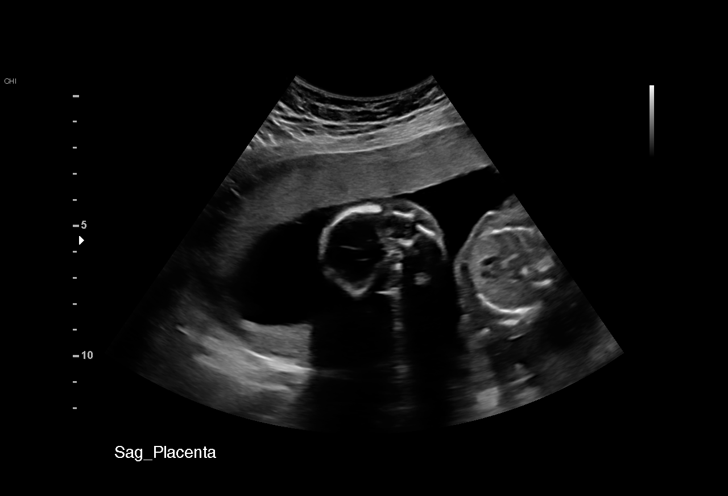
[im 20/175]
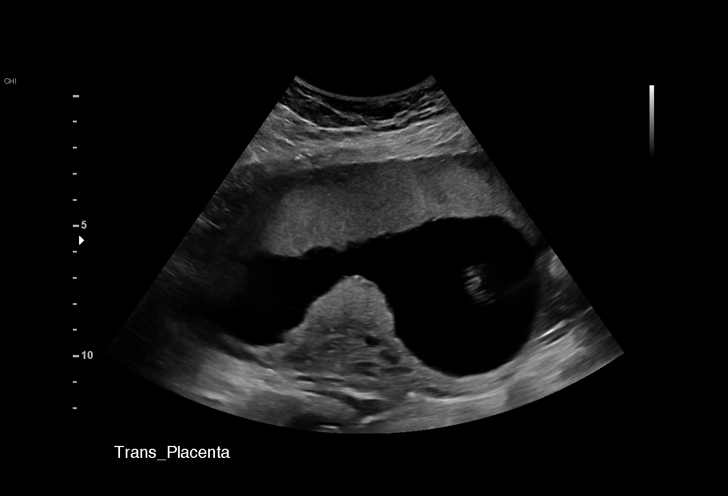
[im 33/175]
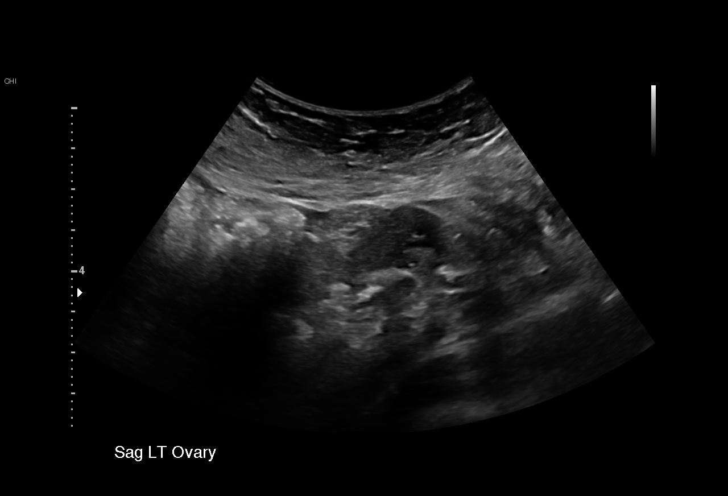
[im 46/175]
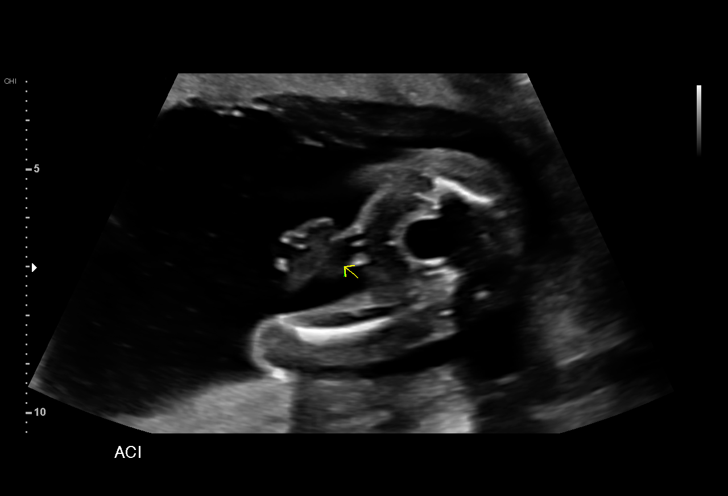
[im 59/175]
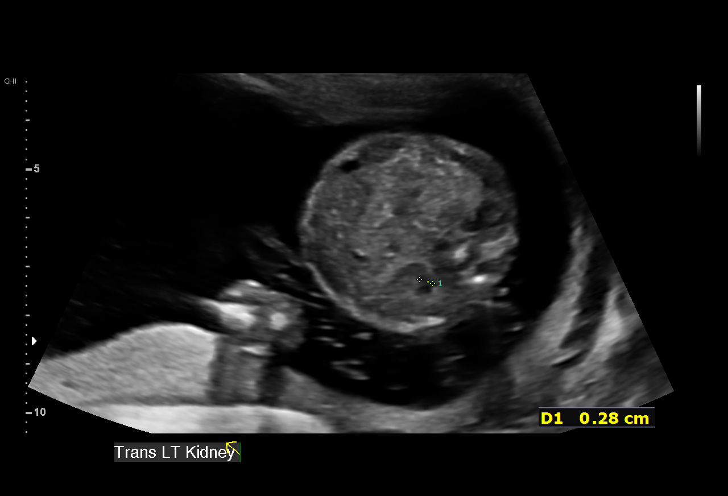
[im 71/175]
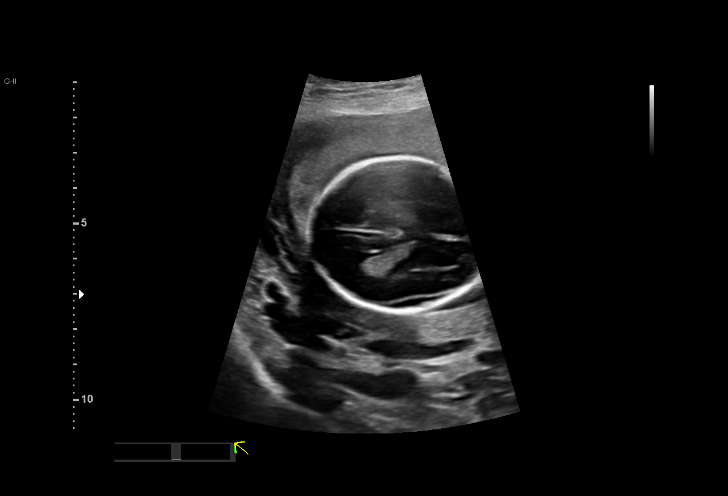
[im 91/175]
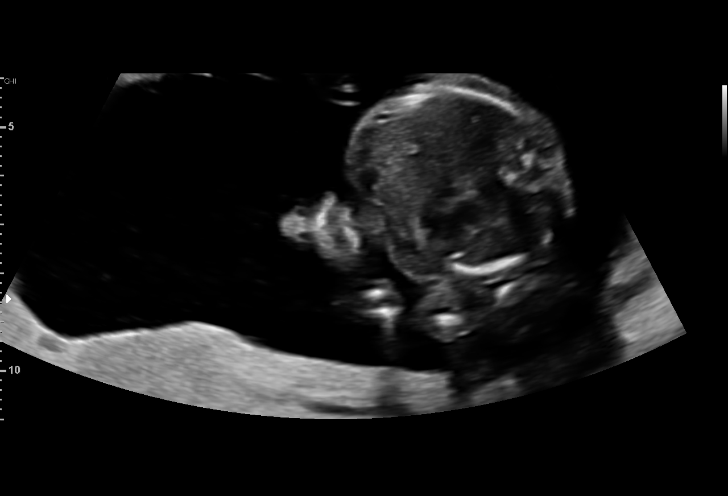
[im 104/175]
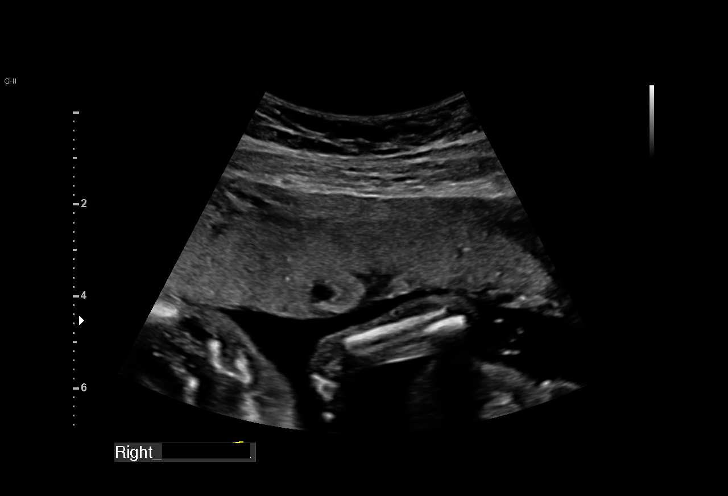
[im 117/175]
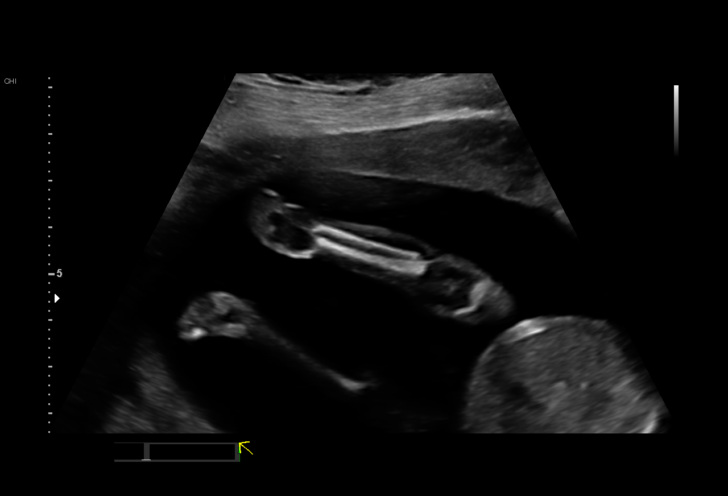
[im 129/175]
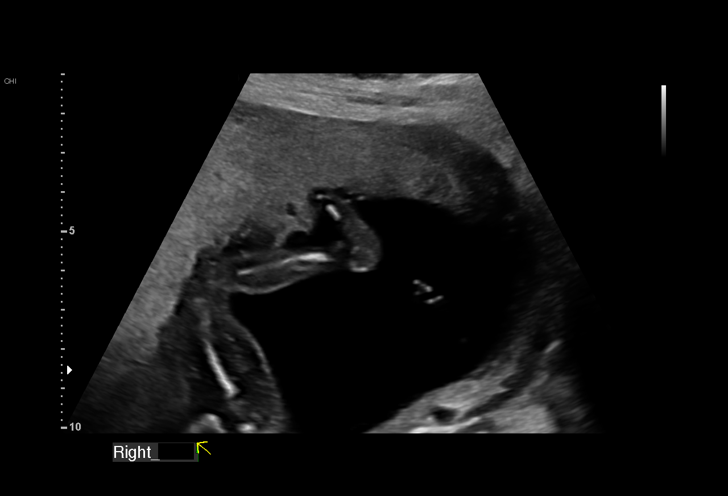
[im 142/175]
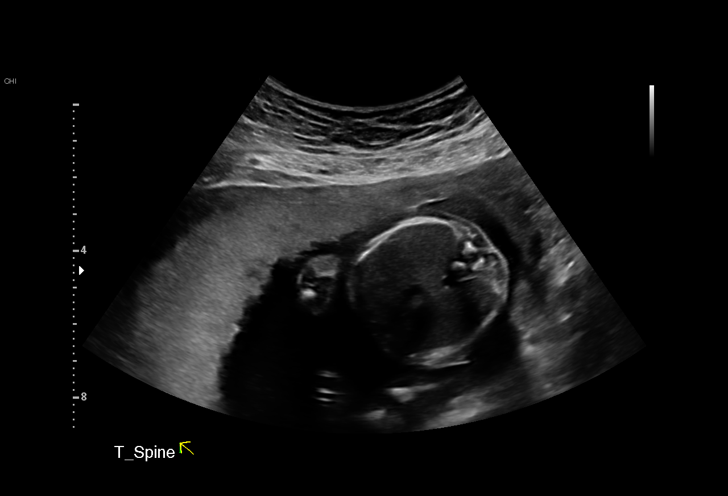
[im 155/175]
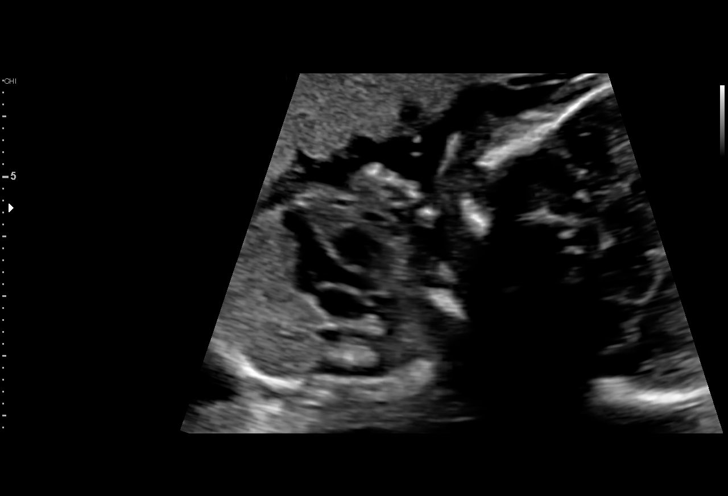
[im 168/175]
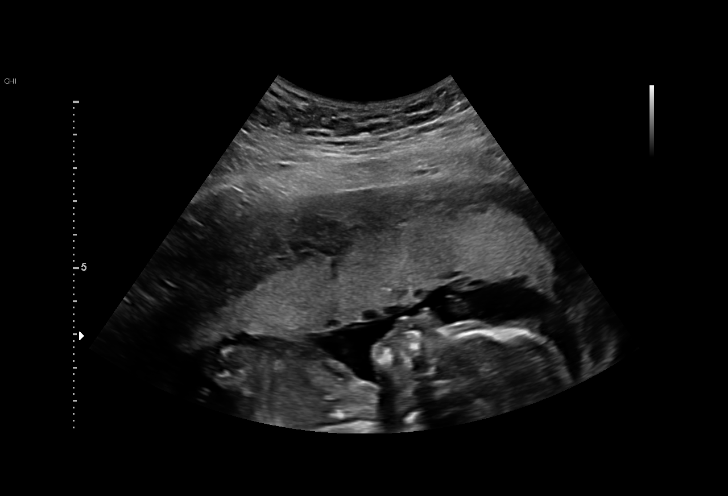

[13 of 28 positions shown; findings below may reference images not displayed]

[REDACTED]care

Indications

 Advanced maternal age primigravida 35+,        [QQ]
 second trimester
 Smoking complicating pregnancy, second         [QQ]
 trimester
 19 weeks gestation of pregnancy
 LR NIPS  Horizon needs to redraw
 Encounter for antenatal screening for          [QQ]
 malformations
Fetal Evaluation

 Num Of Fetuses:         1
 Fetal Heart Rate(bpm):  162
 Cardiac Activity:       Observed
 Presentation:           Variable
 Placenta:               Anterior
 P. Cord Insertion:      Visualized, central

 Amniotic Fluid
 AFI FV:      Within normal limits

                             Largest Pocket(cm)

Biometry
 BPD:      45.2  mm     G. Age:  19w 5d         78  %    CI:        75.87   %    70 - 86
                                                         FL/HC:      17.1   %    16.1 -
 HC:      164.5  mm     G. Age:  19w 1d         51  %    HC/AC:      1.17        1.09 -
 AC:      140.9  mm     G. Age:  19w 3d         62  %    FL/BPD:     62.4   %
 FL:       28.2  mm     G. Age:  18w 4d         30  %    FL/AC:      20.0   %    20 - 24
 HUM:      27.8  mm     G. Age:  18w 6d         48  %
 CER:      20.1  mm     G. Age:  19w 3d         53  %
 NFT:       3.0  mm

 LV:        6.2  mm
 CM:        4.4  mm

 Est. FW:     274  gm    0 lb 10 oz      52  %
OB History

 Gravidity:    4         Term:   2         SAB:   1
 Living:       2
Gestational Age

 LMP:           19w 0d        Date:  [DATE]                 EDD:   [DATE]
 U/S Today:     19w 2d                                        EDD:   [DATE]
 Best:          19w 0d     Det. By:  LMP  ([DATE])          EDD:   [DATE]
Anatomy

 Cranium:               Appears normal         LVOT:                   Appears normal
 Cavum:                 Appears normal         Aortic Arch:            Appears normal
 Ventricles:            Appears normal         Ductal Arch:            Appears normal
 Choroid Plexus:        Appears normal         Diaphragm:              Appears normal
 Cerebellum:            Appears normal         Stomach:                Appears normal, left
                                                                       sided
 Posterior Fossa:       Appears normal         Abdomen:                Appears normal
 Nuchal Fold:           Appears normal         Abdominal Wall:         Appears nml (cord
                                                                       insert, abd wall)
 Face:                  Orbits and profile     Cord Vessels:           Appears normal (3
                        previously seen                                vessel cord)
 Lips:                  Appears normal         Kidneys:                Appear normal
 Palate:                Not well visualized    Bladder:                Appears normal
 Thoracic:              Appears normal         Spine:                  Appears normal
 Heart:                 Appears normal         Upper Extremities:      Appears normal
                        (4CH, axis, and
                        situs)
 RVOT:                  Appears normal         Lower Extremities:      Appears normal

 Other:  Fetus appears to be a male. Nasal bone and lenses visualized. VC,
         3VV and 3VTV visualized. Heels/feet and open hands/5th digits
         visualized.
Cervix Uterus Adnexa

 Cervix
 Length:           3.04  cm.
 Normal appearance by transabdominal scan.
 Uterus
 No abnormality visualized.

 Right Ovary
 Within normal limits.

 Left Ovary
 Within normal limits.

 Cul De Sac
 No free fluid seen.

 Adnexa
 No abnormality visualized.
Impression

 G4 P2. Patient is here for fetal anatomy scan .

 On cell-free fetal DNA screening, the risks of fetal
 aneuploidies are not increased .
 Obstetric history significant for 2 term vaginal deliveries.
 Patient is on multiple medications including hydroxyzine and
 Buspar.

 We performed fetal anatomy scan. No makers of
 aneuploidies or fetal structural defects are seen. Fetal
 biometry is consistent with her previously-established dates.
 Amniotic fluid is normal and good fetal activity is seen.
 Patient understands the limitations of ultrasound in detecting
 fetal anomalies.
Recommendations

 -An appointment was made for her to return in 9 weeks for
 fetal growth assessment (medications).
                 GILLON

## 2020-10-04 MED ORDER — PREPLUS 27-1 MG PO TABS: 1.0000 | ORAL_TABLET | Freq: Every day | ORAL | 8 refills | Status: AC

## 2020-10-04 NOTE — Progress Notes (Signed)
   Subjective:  Ann Brown is a 35 y.o. [phone] at [redacted]w[redacted]d being seen today for ongoing prenatal care.  She is currently monitored for the following issues for this high-risk pregnancy and has TOBACCO USER; ASCUS on Pap smear; History of thyroid disease; Chronic lower back pain; Mild persistent asthma without complication; Abnormal mammogram; Mass of mediastinum; Supervision of high risk pregnancy, antepartum; and Advanced maternal age in multigravida, first trimester on their problem list.  Patient reports no complaints.  Contractions: Not present. Vag. Bleeding: None.  Movement: Present. Denies leaking of fluid.   The following portions of the patient's history were reviewed and updated as appropriate: allergies, current medications, past family history, past medical history, past social history, past surgical history and problem list. Problem list updated.  Objective:   Vitals:   10/04/20 0943  BP: 113/75  Pulse: 98  Weight: 135 lb (61.2 kg)    Fetal Status: Fetal Heart Rate (bpm): 152   Movement: Present     General:  Alert, oriented and cooperative. Patient is in no acute distress.  Skin: Skin is warm and dry. No rash noted.   Cardiovascular: Normal heart rate noted  Respiratory: Normal respiratory effort, no problems with respiration noted  Abdomen: Soft, gravid, appropriate for gestational age. Pain/Pressure: Present     Pelvic: Vag. Bleeding: None     Cervical exam deferred        Extremities: Normal range of motion.  Edema: None  Mental Status: Normal mood and affect. Normal behavior. Normal judgment and thought content.   Urinalysis:      Assessment and Plan:  Pregnancy: [id number] at [redacted]w[redacted]d  1. Supervision of high risk pregnancy, antepartum BP and FHR normal Redraw horizon today and AFP Discussed contraception, she desires BTL - Genetic Screening - AFP, Serum, Open Spina Bifida  2. TOBACCO USER 1/2 ppd Counseled to quit if possible  3. History of thyroid  disease No screening labs on file, will obtain today  4. Advanced maternal age in multigravida, first trimester   Preterm labor symptoms and general obstetric precautions including but not limited to vaginal bleeding, contractions, leaking of fluid and fetal movement were reviewed in detail with the patient. Please refer to After Visit Summary for other counseling recommendations.  Return in 4 weeks (on 11/01/2020) for ob visit.   Venora Maples, MD

## 2020-10-04 NOTE — Patient Instructions (Signed)

## 2020-10-10 LAB — AFP, SERUM, OPEN SPINA BIFIDA
AFP MoM: 1.19
AFP Value: 66.8 ng/mL
Gest. Age on Collection Date: 19 weeks
Maternal Age At EDD: 35.8 yr
OSBR Risk 1 IN: 6704
Test Results:: NEGATIVE
Weight: 135 [lb_av]

## 2020-10-10 LAB — THYROID PANEL WITH TSH
Free Thyroxine Index: 2 (ref 1.2–4.9)
T3 Uptake Ratio: 18 % — ABNORMAL LOW (ref 24–39)
T4, Total: 11.2 ug/dL (ref 4.5–12.0)
TSH: 1.03 u[IU]/mL (ref 0.450–4.500)

## 2020-10-10 LAB — THYROID PEROXIDASE ANTIBODY: Thyroperoxidase Ab SerPl-aCnc: 9 IU/mL (ref 0–34)

## 2020-10-21 ENCOUNTER — Encounter: Payer: Self-pay | Admitting: *Deleted

## 2020-10-28 ENCOUNTER — Encounter: Payer: Self-pay | Admitting: *Deleted

## 2020-11-01 ENCOUNTER — Encounter: Payer: 59 | Admitting: Family Medicine

## 2020-11-29 ENCOUNTER — Encounter: Payer: Self-pay | Admitting: Family Medicine

## 2020-11-29 ENCOUNTER — Ambulatory Visit (INDEPENDENT_AMBULATORY_CARE_PROVIDER_SITE_OTHER): Payer: 59 | Admitting: Family Medicine

## 2020-11-29 ENCOUNTER — Other Ambulatory Visit: Payer: Self-pay

## 2020-11-29 VITALS — BP 115/79 | HR 105 | Wt 142.3 lb

## 2020-11-29 DIAGNOSIS — O09521 Supervision of elderly multigravida, first trimester: Secondary | ICD-10-CM

## 2020-11-29 DIAGNOSIS — Z23 Encounter for immunization: Secondary | ICD-10-CM

## 2020-11-29 DIAGNOSIS — K219 Gastro-esophageal reflux disease without esophagitis: Secondary | ICD-10-CM

## 2020-11-29 DIAGNOSIS — O0990 Supervision of high risk pregnancy, unspecified, unspecified trimester: Secondary | ICD-10-CM | POA: Diagnosis not present

## 2020-11-29 DIAGNOSIS — F17200 Nicotine dependence, unspecified, uncomplicated: Secondary | ICD-10-CM

## 2020-11-29 DIAGNOSIS — Z8632 Personal history of gestational diabetes: Secondary | ICD-10-CM

## 2020-11-29 MED ORDER — ESOMEPRAZOLE MAGNESIUM 40 MG PO CPDR: 40.0000 mg | DELAYED_RELEASE_CAPSULE | Freq: Two times a day (BID) | ORAL | 5 refills | Status: DC

## 2020-11-29 NOTE — Progress Notes (Signed)
   Subjective:  Ann Brown is a 35 y.o. [phone] at [redacted]w[redacted]d being seen today for ongoing prenatal care.  She is currently monitored for the following issues for this low-risk pregnancy and has TOBACCO USER; History of thyroid disease; Chronic lower back pain; Mild persistent asthma without complication; Abnormal mammogram; Mass of mediastinum; Supervision of high risk pregnancy, antepartum; and Advanced maternal age in multigravida, first trimester on their problem list.  Patient reports heartburn.  Contractions: Irritability. Vag. Bleeding: None.  Movement: Present. Denies leaking of fluid.   The following portions of the patient's history were reviewed and updated as appropriate: allergies, current medications, past family history, past medical history, past social history, past surgical history and problem list. Problem list updated.  Objective:   Vitals:   11/29/20 1610  BP: 115/79  Pulse: (!) 105  Weight: 142 lb 4.8 oz (64.5 kg)    Fetal Status: Fetal Heart Rate (bpm): 158   Movement: Present     General:  Alert, oriented and cooperative. Patient is in no acute distress.  Skin: Skin is warm and dry. No rash noted.   Cardiovascular: Normal heart rate noted  Respiratory: Normal respiratory effort, no problems with respiration noted  Abdomen: Soft, gravid, appropriate for gestational age. Pain/Pressure: Present     Pelvic: Vag. Bleeding: None     Cervical exam deferred        Extremities: Normal range of motion.  Edema: Trace  Mental Status: Normal mood and affect. Normal behavior. Normal judgment and thought content.   Urinalysis:      Assessment and Plan:  Pregnancy: [id number] at [redacted]w[redacted]d  1. Supervision of high risk pregnancy, antepartum BP and FHR normal Trial nexium for heartburn Will reschedule 28wk labs as she is not fasting  2. TOBACCO USER Still at 1/2 ppd, encouraged to continue cutting down  3. Advanced maternal age in multigravida, first trimester   Preterm  labor symptoms and general obstetric precautions including but not limited to vaginal bleeding, contractions, leaking of fluid and fetal movement were reviewed in detail with the patient. Please refer to After Visit Summary for other counseling recommendations.  Return in 2 weeks (on 12/13/2020).   Venora Maples, MD

## 2020-11-29 NOTE — Patient Instructions (Addendum)
Dental Resources   [place]   Dr. Remer Macho  Exam $85   [address]  Extraction $120 and up   [employer], [place]  *full list of prices [phone]      [facility]  Exam [phone]   [address]  Exam w/ Xrays $380   [place], [place]  Xrays $61 and up   [phone]  Cleaning $101   Extraction $190 and up      [facility]  Cleaning + Xray $344   [address]  Extraction- pt has to be seen first to give price   [place], [place]   [phone]     [facility]   Dr. Romeo Apple Turner/Dr. Richrd Humbles  Exam, Cleaning, Xray $262   [address]  Extraction [phone]   [place]   [phone]      [facility]  Cleaning $5   [address] $5   [place], [place] [address]  Call to get on waiting list   [phone] ext [phone]     Dr. Fayrene Fearing Mc[place]asters/Dr. Stephenie Acres   [address]  Xray $85 Each   [place], [place] [address]  Extraction [phone]    Dr. Hoover Browns  Extraction $300 per tooth   [address]   [place], [place] [address]   [phone]      Dr. Nuala Alpha  Cleaning $300   [address]  Extraction $273   [place], [place] [address]   [phone]     [facility]  Emergency Exam $65   [address] Exam $150   [place], [place] [address]  Extractions: Simple $180 Surgical $250   [phone]  Fillings [phone]       Contraception Choices Contraception, also called birth control, refers to methods or devices thatprevent pregnancy. Hormonal methods  Contraceptive implant A contraceptive implant is a thin, plastic tube that contains a hormone that prevents pregnancy. It is different from an intrauterine device (IUD). It is inserted into the upper part of the arm by a health care provider. Implants canbe effective for up to 3 years. Progestin-only injections Progestin-only injections are injections of progestin, a synthetic form of thehormone progesterone. They are given every 3  months by a health care provider. Birth control pills Birth control pills are pills that contain hormones that prevent pregnancy. They must be taken once a day, preferably at the same time each day. Aprescription is needed to use this method of contraception. Birth control patch The birth control patch contains hormones that prevent pregnancy. It is placed on the skin and must be changed once a week for three weeks and removed on thefourth week. A prescription is needed to use this method of contraception. Vaginal ring A vaginal ring contains hormones that prevent pregnancy. It is placed in the vagina for three weeks and removed on the fourth week. After that, the process is repeated with a new ring. A prescription is needed to use this method ofcontraception. Emergency contraceptive Emergency contraceptives prevent pregnancy after unprotected sex. They come in pill form and can be taken up to 5 days after sex. They work best the sooner they are taken after having sex. Most emergency contraceptives are available without a prescription. This method should not be used as your only form ofbirth control. Barrier methods  Female condom A female condom is a thin  sheath that is worn over the penis during sex. Condoms keep sperm from going inside a woman's body. They can be used with a sperm-killing substance (spermicide) to increase their effectiveness. They should be thrown away after one use. Female condom A female condom is a soft, loose-fitting sheath that is put into the vagina before sex. The condom keeps sperm from going inside a woman's body. Theyshould be thrown away after one use. Diaphragm A diaphragm is a soft, dome-shaped barrier. It is inserted into the vagina before sex, along with a spermicide. The diaphragm blocks sperm from entering the uterus, and the spermicide kills sperm. A diaphragm should be left in thevagina for 6-8 hours after sex and removed within 24 hours. A diaphragm is prescribed  and fitted by a health care provider. A diaphragm should be replaced every 1-2 years, after giving birth, after gaining more than15 lb (6.8 kg), and after pelvic surgery. Cervical cap A cervical cap is a round, soft latex or plastic cup that fits over the cervix. It is inserted into the vagina before sex, along with spermicide. It blocks sperm from entering the uterus. The cap should be left in place for 6-8 hours after sex and removed within 48 hours. A cervical cap must be prescribed andfitted by a health care provider. It should be replaced every 2 years. Sponge A sponge is a soft, circular piece of polyurethane foam with spermicide in it. The sponge helps block sperm from entering the uterus, and the spermicide kills sperm. To use it, you make it wet and then insert it into the vagina. It should be inserted before sex, left in for at least 6 hours after sex, and removed andthrown away within 30 hours. Spermicides Spermicides are chemicals that kill or block sperm from entering the cervix and uterus. They can come as a cream, jelly, suppository, foam, or tablet. A spermicide should be inserted into the vagina with an applicator at least 10-15 minutes before sex to allow time for it to work. The process must be repeatedevery time you have sex. Spermicides do not require a prescription. Intrauterine contraception Intrauterine device (IUD) An IUD is a T-shaped device that is put in a woman's uterus. There are two types: Hormone IUD.This type contains progestin, a synthetic form of the hormone progesterone. This type can stay in place for 3-5 years. Copper IUD.This type is wrapped in copper wire. It can stay in place for 10 years. Permanent methods of contraception Female tubal ligation In this method, a woman's fallopian tubes are sealed, tied, or blocked duringsurgery to prevent eggs from traveling to the uterus. Hysteroscopic sterilization In this method, a small, flexible insert is placed into  each fallopian tube. The inserts cause scar tissue to form in the fallopian tubes and block them, so sperm cannot reach an egg. The procedure takes about 3 months to be effective.Another form of birth control must be used during those 3 months. Female sterilization This is a procedure to tie off the tubes that carry sperm (vasectomy). After the procedure, the man can still ejaculate fluid (semen). Another form of birth control must be used for 3 months after the procedure. Natural planning methods Natural family planning In this method, a couple does not have sex on days when the woman could become pregnant. Calendar method In this method, the woman keeps track of the length of each menstrual cycle, identifies the days when pregnancy can happen, and does not have sex on those days. Ovulation method In this  method, a couple avoids sex during ovulation. Symptothermal method This method involves not having sex during ovulation. The woman typically checks for ovulation bywatching changes in her temperature and in the consistency of cervical mucus. Post-ovulation method In this method, a couple waits to have sex until after ovulation. Where to find more information Centers for Disease Control and Prevention: [employer] Summary Contraception, also called birth control, refers to methods or devices that prevent pregnancy. Hormonal methods of contraception include implants, injections, pills, patches, vaginal rings, and emergency contraceptives. Barrier methods of contraception can include female condoms, female condoms, diaphragms, cervical caps, sponges, and spermicides. There are two types of IUDs (intrauterine devices). An IUD can be put in a woman's uterus to prevent pregnancy for 3-5 years. Permanent sterilization can be done through a procedure for males and females. Natural family planning methods involve nothaving sex on days when the woman could become pregnant. This information is not intended  to replace advice given to you by your health care provider. Make sure you discuss any questions you have with your healthcare provider. Document Revised: 09/14/2019 Document Reviewed: 09/14/2019 Elsevier Patient Education  2022 [employer].

## 2020-11-30 ENCOUNTER — Other Ambulatory Visit: Payer: Self-pay

## 2020-11-30 DIAGNOSIS — J4530 Mild persistent asthma, uncomplicated: Secondary | ICD-10-CM

## 2020-12-08 ENCOUNTER — Ambulatory Visit: Payer: 59 | Attending: Obstetrics and Gynecology

## 2020-12-08 ENCOUNTER — Ambulatory Visit: Payer: 59 | Admitting: *Deleted

## 2020-12-08 ENCOUNTER — Other Ambulatory Visit: Payer: Self-pay

## 2020-12-08 ENCOUNTER — Encounter: Payer: Self-pay | Admitting: *Deleted

## 2020-12-08 VITALS — BP 119/72 | HR 103

## 2020-12-08 DIAGNOSIS — O99333 Smoking (tobacco) complicating pregnancy, third trimester: Secondary | ICD-10-CM | POA: Diagnosis not present

## 2020-12-08 DIAGNOSIS — Z362 Encounter for other antenatal screening follow-up: Secondary | ICD-10-CM | POA: Diagnosis not present

## 2020-12-08 DIAGNOSIS — O0990 Supervision of high risk pregnancy, unspecified, unspecified trimester: Secondary | ICD-10-CM | POA: Insufficient documentation

## 2020-12-08 DIAGNOSIS — O322XX0 Maternal care for transverse and oblique lie, not applicable or unspecified: Secondary | ICD-10-CM

## 2020-12-08 DIAGNOSIS — O09523 Supervision of elderly multigravida, third trimester: Secondary | ICD-10-CM

## 2020-12-08 DIAGNOSIS — F17210 Nicotine dependence, cigarettes, uncomplicated: Secondary | ICD-10-CM | POA: Diagnosis not present

## 2020-12-08 DIAGNOSIS — Z3A28 28 weeks gestation of pregnancy: Secondary | ICD-10-CM

## 2020-12-09 ENCOUNTER — Other Ambulatory Visit: Payer: Self-pay | Admitting: *Deleted

## 2020-12-09 DIAGNOSIS — O99330 Smoking (tobacco) complicating pregnancy, unspecified trimester: Secondary | ICD-10-CM

## 2020-12-09 DIAGNOSIS — O09529 Supervision of elderly multigravida, unspecified trimester: Secondary | ICD-10-CM

## 2020-12-13 ENCOUNTER — Other Ambulatory Visit: Payer: Self-pay

## 2020-12-13 ENCOUNTER — Other Ambulatory Visit: Payer: Self-pay | Admitting: General Practice

## 2020-12-13 ENCOUNTER — Other Ambulatory Visit: Payer: 59

## 2020-12-13 ENCOUNTER — Ambulatory Visit (INDEPENDENT_AMBULATORY_CARE_PROVIDER_SITE_OTHER): Payer: 59 | Admitting: Certified Nurse Midwife

## 2020-12-13 VITALS — BP 120/78 | HR 105 | Wt 143.0 lb

## 2020-12-13 DIAGNOSIS — Z3A29 29 weeks gestation of pregnancy: Secondary | ICD-10-CM

## 2020-12-13 DIAGNOSIS — R0981 Nasal congestion: Secondary | ICD-10-CM

## 2020-12-13 DIAGNOSIS — O99513 Diseases of the respiratory system complicating pregnancy, third trimester: Secondary | ICD-10-CM

## 2020-12-13 DIAGNOSIS — O0990 Supervision of high risk pregnancy, unspecified, unspecified trimester: Secondary | ICD-10-CM

## 2020-12-13 DIAGNOSIS — O0993 Supervision of high risk pregnancy, unspecified, third trimester: Secondary | ICD-10-CM

## 2020-12-13 DIAGNOSIS — J45909 Unspecified asthma, uncomplicated: Secondary | ICD-10-CM

## 2020-12-13 DIAGNOSIS — K047 Periapical abscess without sinus: Secondary | ICD-10-CM

## 2020-12-13 MED ORDER — BREZTRI AEROSPHERE 160-9-4.8 MCG/ACT IN AERO: 1.0000 | INHALATION_SPRAY | Freq: Two times a day (BID) | RESPIRATORY_TRACT | 0 refills | Status: DC

## 2020-12-13 MED ORDER — AMOXICILLIN 500 MG PO CAPS: 500.0000 mg | ORAL_CAPSULE | Freq: Three times a day (TID) | ORAL | 0 refills | Status: DC

## 2020-12-13 MED ORDER — FLUTICASONE PROPIONATE 50 MCG/ACT NA SUSP: 1.0000 | Freq: Every day | NASAL | 2 refills | Status: DC

## 2020-12-14 LAB — CBC
Hematocrit: 36.5 % (ref 34.0–46.6)
Hemoglobin: 13.1 g/dL (ref 11.1–15.9)
MCH: 36.1 pg — ABNORMAL HIGH (ref 26.6–33.0)
MCHC: 35.9 g/dL — ABNORMAL HIGH (ref 31.5–35.7)
MCV: 101 fL — ABNORMAL HIGH (ref 79–97)
Platelets: 228 10*3/uL (ref 150–450)
RBC: 3.63 x10E6/uL — ABNORMAL LOW (ref 3.77–5.28)
RDW: 12 % (ref 11.7–15.4)
WBC: 10.7 10*3/uL (ref 3.4–10.8)

## 2020-12-14 LAB — RPR: RPR Ser Ql: NONREACTIVE

## 2020-12-14 LAB — HIV ANTIBODY (ROUTINE TESTING W REFLEX): HIV Screen 4th Generation wRfx: NONREACTIVE

## 2020-12-14 NOTE — Progress Notes (Signed)
   PRENATAL VISIT NOTE  Subjective:  Ann Brown is a 35 y.o. [phone] at [redacted]w[redacted]d being seen today for ongoing prenatal care.  She is currently monitored for the following issues for this high-risk pregnancy and has TOBACCO USER; History of gestational diabetes; History of thyroid disease; Chronic lower back pain; Mild persistent asthma without complication; Abnormal mammogram; Mass of mediastinum; Supervision of high risk pregnancy, antepartum; and Advanced maternal age in multigravida, first trimester on their problem list.  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 Requests antibiotics for both until she can get in with her dentist to have teeth pulled. The back pain is not new, but increasing with pregnancy. Tylenol does not work and she cannot take flexeril, has a prescription of magnesium but was unclear on how to take it. Also needs a refill of her inhaler. Otherwise doing well.  Contractions: Irritability. Vag. Bleeding: None.  Movement: Present. Denies leaking of fluid.   The following portions of the patient's history were reviewed and updated as appropriate: allergies, current medications, past family history, past medical history, past social history, past surgical history and problem list.   Objective:   Vitals:   12/13/20 0956  BP: 120/78  Pulse: (!) 105  Weight: 143 lb (64.9 kg)   Fetal Status: Fetal Heart Rate (bpm): 154 Fundal Height: 29 cm Movement: Present     General:  Alert, oriented and cooperative. Patient is in no acute distress.  Skin: Skin is warm and dry. No rash noted.   Cardiovascular: Normal heart rate noted  Respiratory: Normal respiratory effort, no problems with respiration noted  Abdomen: Soft, gravid, appropriate for gestational age.  Pain/Pressure: Present     Pelvic: Cervical exam deferred        Extremities: Normal range of motion.  Edema: Trace  Mental  Status: Normal mood and affect. Normal behavior. Normal judgment and thought content.   Assessment and Plan:  Pregnancy: [id number] at [redacted]w[redacted]d 1. Supervision of high risk pregnancy in third trimester - Feeling regular and vigorous fetal movement  2. [redacted] weeks gestation of pregnancy - Routine OB care  3. Dental infection - amoxicillin (AMOXIL) 500 MG capsule; Take 1 capsule (500 mg total) by mouth 3 (three) times daily.  Dispense: 15 capsule; Refill: 0  4. Nasal congestion - fluticasone (FLONASE) 50 MCG/ACT nasal spray; Place 1 spray into both nostrils daily.  Dispense: 11.1 mL; Refill: 2  5. Asthma affecting pregnancy in third trimester - Budeson-Glycopyrrol-Formoterol (BREZTRI AEROSPHERE) 160-9-4.8 MCG/ACT AERO; Inhale 1-2 puffs into the lungs in the morning and at bedtime.  Dispense: 10.7 g; Refill: 0  6. Back pain in pregnancy - Encouraged use of her maternity belt. Reviewed stretches to help relieve back pain and the correct way to take the magnesium. She agreed to try it again.  Preterm labor symptoms and general obstetric precautions including but not limited to vaginal bleeding, contractions, leaking of fluid and fetal movement were reviewed in detail with the patient. Please refer to After Visit Summary for other counseling recommendations.   Future Appointments  Date Time Provider Department Center  12/27/2020  9:30 AM [facility] LAB [facility]  01/11/2021 10:35 AM Adam Phenix, MD [facility]  01/19/2021 12:30 PM [facility] NURSE [facility]  01/19/2021 12:45 PM [facility] US4 [facility]    Bernerd Limbo, [name]

## 2020-12-22 ENCOUNTER — Other Ambulatory Visit: Payer: 59

## 2020-12-27 ENCOUNTER — Other Ambulatory Visit: Payer: 59

## 2020-12-27 ENCOUNTER — Other Ambulatory Visit: Payer: Self-pay

## 2020-12-27 DIAGNOSIS — O0990 Supervision of high risk pregnancy, unspecified, unspecified trimester: Secondary | ICD-10-CM

## 2020-12-28 LAB — GLUCOSE TOLERANCE, 2 HOURS W/ 1HR
Glucose, 1 hour: 214 mg/dL — ABNORMAL HIGH (ref 65–179)
Glucose, 2 hour: 177 mg/dL — ABNORMAL HIGH (ref 65–152)
Glucose, Fasting: 85 mg/dL (ref 65–91)

## 2020-12-30 ENCOUNTER — Other Ambulatory Visit: Payer: Self-pay | Admitting: Certified Nurse Midwife

## 2020-12-30 DIAGNOSIS — O24410 Gestational diabetes mellitus in pregnancy, diet controlled: Secondary | ICD-10-CM

## 2020-12-30 MED ORDER — ACCU-CHEK SOFTCLIX LANCETS MISC
1.0000 | Freq: Four times a day (QID) | 12 refills | Status: DC
Start: 2020-12-30 — End: 2021-02-27

## 2020-12-30 MED ORDER — ACCU-CHEK SMARTVIEW VI STRP: ORAL_STRIP | 12 refills | Status: DC

## 2020-12-30 MED ORDER — ACCU-CHEK NANO SMARTVIEW W/DEVICE KIT: 1.0000 | PACK | 0 refills | Status: DC

## 2021-01-09 ENCOUNTER — Ambulatory Visit
Admission: RE | Admit: 2021-01-09 | Discharge: 2021-01-09 | Disposition: A | Discharge status: Home / Self Care | Payer: 59 | Source: Ambulatory Visit | Attending: Internal Medicine | Admitting: Internal Medicine

## 2021-01-09 ENCOUNTER — Other Ambulatory Visit: Payer: Self-pay

## 2021-01-09 VITALS — BP 120/79 | HR 114 | Temp 97.9°F | Resp 20

## 2021-01-09 DIAGNOSIS — Z20822 Contact with and (suspected) exposure to covid-19: Secondary | ICD-10-CM | POA: Diagnosis not present

## 2021-01-09 DIAGNOSIS — J029 Acute pharyngitis, unspecified: Secondary | ICD-10-CM | POA: Diagnosis not present

## 2021-01-09 DIAGNOSIS — J069 Acute upper respiratory infection, unspecified: Secondary | ICD-10-CM | POA: Diagnosis not present

## 2021-01-09 LAB — POCT RAPID STREP A (OFFICE): Rapid Strep A Screen: NEGATIVE

## 2021-01-09 MED ORDER — CETIRIZINE HCL 10 MG PO TABS: 10.0000 mg | ORAL_TABLET | Freq: Every day | ORAL | 0 refills | Status: DC

## 2021-01-09 MED ORDER — PSEUDOEPHEDRINE HCL 30 MG PO TABS: 30.0000 mg | ORAL_TABLET | ORAL | 0 refills | Status: DC | PRN

## 2021-01-09 NOTE — ED Provider Notes (Signed)
[facility]    CSN: [id number] Arrival date & time: 01/09/21  1704      History   Chief Complaint Chief Complaint  Patient presents with   appt - 5 - cough/SOB    HPI Ann Brown is a 35 y.o. female.   Patient presents with four day history of cough, sore throat, runny nose, intermittent shortness of breath.  Patient states that most of the shortness of breath is from nasal congestion and having to wear a n95 at work most of the time.  Does have some intermittent shortness of breath in the chest at times.  Patient has history of asthma and has been using her albuterol inhaler with minimal relief of symptoms.  Taken at home COVID-19 test today and it was negative.  Denies any chest pain.  Was exposed to COVID-19 at work as well.  Currently [redacted] weeks pregnant.    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   Abnormal mammogram 01/11/2017   Mild persistent asthma without complication 95/12/3265   Chronic lower back pain 09/24/2014   History of thyroid disease 02/24/2013   History of gestational diabetes 01/21/2013   TOBACCO USER 04/25/2009    Past Surgical History:  Procedure Laterality Date   BOWEL RESECTION     CHOLECYSTECTOMY     Complex trunk closure  2005   CSF SHUNT  1987   TRACHEOSTOMY  1987    OB History     Gravida  4   Para  2   Term  2   Preterm  0   AB  1   Living  2      SAB  0   IAB  0   Ectopic  0   Multiple      Live  Births  2            Home Medications    Prior to Admission medications   Medication Sig Start Date End Date Taking? Authorizing Provider  cetirizine (ZYRTEC) 10 MG tablet Take 1 tablet (10 mg total) by mouth daily. 01/09/21  Yes Odis Luster, FNP  pseudoephedrine (SUDAFED) 30 MG tablet Take 1 tablet (30 mg total) by mouth every 4 (four) hours as needed for congestion. 01/09/21  Yes Odis Luster, FNP  Accu-Chek Softclix Lancets lancets 1 each by Other route 4 (four) times daily. 12/30/20   Gabriel Carina, [name]  albuterol (PROVENTIL) (2.5 MG/3ML) 0.083% nebulizer solution Take 3 mLs (2.5 mg total) by nebulization every 6 (six) hours as needed for wheezing or shortness of breath. 05/26/19   Tasia Catchings, Amy V, PA-C  albuterol (VENTOLIN HFA) 108 (90 Base) MCG/ACT inhaler Inhale 1-2 puffs into the lungs every 6 (six) hours as needed for wheezing or shortness of breath. 05/04/20   Wieters, Hallie C, PA-C  amphetamine-dextroamphetamine (ADDERALL) 20 MG tablet Take 20 mg by mouth 2 (two) times daily.  [provider]  Blood Glucose Monitoring Suppl (ACCU-CHEK NANO SMARTVIEW) w/Device KIT 1 kit by Subdermal route as directed. Check blood sugars for fasting, and two hours after breakfast, lunch and dinner (4 checks daily) 12/30/20   Gabriel Carina, [name]  Budeson-Glycopyrrol-Formoterol (BREZTRI AEROSPHERE) 160-9-4.8 MCG/ACT AERO Inhale 1-2 puffs into the lungs in the morning and at bedtime. 12/13/20   Gabriel Carina, [name]  buPROPion (WELLBUTRIN SR) 100 MG 12 hr tablet take 1 tablet by oral route every morning 07/10/17   [provider]  busPIRone (BUSPAR) 5 MG tablet Take 5 mg by mouth 2 (two) times daily as needed. 07/22/20   [provider]  esomeprazole (NEXIUM) 40 MG capsule Take 1 capsule (40 mg total) by mouth 2 (two) times daily before a meal. 11/29/20   Clarnce Flock, MD  fluticasone (FLONASE) 50 MCG/ACT nasal spray Place 1 spray into both nostrils daily. 12/13/20   Gaylan Gerold R, [name]  glucose blood (ACCU-CHEK SMARTVIEW) test strip Use as instructed to check blood sugars 12/30/20   Gaylan Gerold R, [name]  hydrOXYzine (ATARAX/VISTARIL) 10 MG tablet Take 10 mg by mouth at bedtime as needed. 09/06/20   [provider]  Prenatal Vit-Fe Fumarate-FA (PREPLUS) 27-1 MG TABS Take 1 tablet by mouth daily. 10/04/20   Clarnce Flock, MD  Fluticasone-Salmeterol (ADVAIR) 100-50 MCG/DOSE AEPB Inhale 1 puff into the lungs 2 (two) times daily. 05/27/17 05/04/20  Everrett Coombe, MD    Family History Family History  Problem Relation Age of Onset   Multiple sclerosis Mother    Cervical cancer Mother    Breast cancer Maternal Grandmother        unsure of age    Social History Social History   Tobacco Use   Smoking status: Every Day    Packs/day: 1.00    Types: Cigarettes, E-cigarettes   Smokeless tobacco: Never   Tobacco comments:    Also uses ecigarette  Vaping Use   Vaping Use: Never used  Substance Use Topics   Alcohol use: No    Alcohol/week: 0.0 standard drinks   Drug use: No     Allergies   Other and Ketorolac   Review of Systems Review of Systems Per HPI  Physical Exam Triage Vital Signs ED Triage Vitals  Enc Vitals Group     BP 01/09/21 1729 120/79     Pulse Rate 01/09/21 1729 (!) 114     Resp 01/09/21 1729 20     Temp 01/09/21 1729 97.9 F (36.6 C)     Temp Source 01/09/21 1729 Oral     SpO2 01/09/21 1729 97 %     Weight --      Height --      Head Circumference --      Peak Flow --      Pain Score 01/09/21 1730 0     Pain Loc --      Pain Edu? --      Excl. in [place]? --    No data found.  Updated Vital Signs BP 120/79 (BP Location: Right Arm)   Pulse (!) 114   Temp 97.9 F (36.6 C) (Oral)   Resp 20   LMP 05/24/2020 (Exact Date)   SpO2 97%   Visual Acuity Right Eye Distance:   Left Eye Distance:   Bilateral Distance:    Right Eye Near:   Left Eye Near:    Bilateral Near:     Physical Exam Constitutional:  General: She is not in acute distress.    Appearance: Normal appearance.  HENT:     Head: Normocephalic and atraumatic.     Right Ear: Tympanic membrane and ear canal normal.     Left Ear: Tympanic membrane and ear canal normal.     Nose: Congestion present.     Mouth/Throat:     Mouth: Mucous membranes are moist.     Pharynx: Posterior oropharyngeal erythema present.  Eyes:     Extraocular Movements: Extraocular movements intact.     Conjunctiva/sclera: Conjunctivae normal.     Pupils: Pupils are equal, round, and reactive to light.  Cardiovascular:     Rate and Rhythm: Normal rate and regular rhythm.     Pulses: Normal pulses.     Heart sounds: Normal heart sounds.  Pulmonary:     Effort: Pulmonary effort is normal. No respiratory distress.     Breath sounds: Normal breath sounds. No stridor. No wheezing, rhonchi or rales.     Comments: Able to speak in complete sentences. Abdominal:     General: Abdomen is flat. Bowel sounds are normal.     Palpations: Abdomen is soft.  Musculoskeletal:        General: Normal range of motion.     Cervical back: Normal range of motion.  Skin:    General: Skin is warm and dry.  Neurological:     General: No focal deficit present.     Mental Status: She is alert and oriented to person, place, and time. Mental status is at baseline.  Psychiatric:        Mood and Affect: Mood normal.        Behavior: Behavior normal.     UC Treatments / Results  Labs (all labs ordered are listed, but only abnormal results are displayed) Labs Reviewed  CULTURE, GROUP A STREP ([place])  NOVEL CORONAVIRUS, NAA  POCT RAPID STREP A (OFFICE)    EKG   Radiology No results found.  Procedures Procedures (including critical care time)  Medications Ordered in UC Medications - No data to display  Initial Impression / Assessment and Plan / UC Course  I have reviewed the triage vital signs and the nursing notes.  Pertinent labs & imaging results that were  available during my care of the patient were reviewed by me and considered in my medical decision making (see chart for details).     Patient presents with symptoms likely from a viral upper respiratory infection. Differential includes bacterial pneumonia, sinusitis, allergic rhinitis, Covid 19.  Highly suspicious for COVID-19 due to close exposure.  Do not suspect underlying cardiopulmonary process. Symptoms seem unlikely related to ACS, CHF or COPD exacerbations, pneumonia, pneumothorax. Patient is nontoxic appearing and not in need of emergent medical intervention.  Limited options for symptom management given patient's current pregnancy status.  Sudafed and cetirizine prescribed for patient for symptom management.  Advised patient to limit use of medications.  Suggested Flonase, but patient states that she already has this medication at home.  Advised patient to follow-up with OB/GYN for further evaluation and management of respiratory illness given intermittent shortness of breath and asthma.  Unable to prescribe prednisone due to pregnancy state.  Patient is not in any respiratory distress and is able to speak in complete sentences during physical exam.  Rapid strep test was negative.  Throat culture is pending.  COVID-19 viral swab pending.  Return if symptoms fail to improve in 1-2 weeks or you develop shortness of breath,  chest pain, severe headache. Patient states understanding and is agreeable.  Advised patient to go to the hospital shortness of breath worsens.  Discharged with PCP and OB/GYN followup.  Final Clinical Impressions(s) / UC Diagnoses   Final diagnoses:  Viral upper respiratory infection  Sore throat  Encounter for laboratory testing for COVID-19 virus     Discharge Instructions      Please call OB/GYN for further evaluation and management.  You have been prescribed 2 medications to help with symptoms.  Please also use Flonase daily.  Limit medication use if possible  due to pregnancy.  COVID-19 test is pending.  We will call if it is positive.  Go to the hospital shortness of breath worsens.     ED Prescriptions     Medication Sig Dispense Auth. Provider   cetirizine (ZYRTEC) 10 MG tablet Take 1 tablet (10 mg total) by mouth daily. 30 tablet Odis Luster, FNP   pseudoephedrine (SUDAFED) 30 MG tablet Take 1 tablet (30 mg total) by mouth every 4 (four) hours as needed for congestion. 30 tablet Odis Luster, FNP      PDMP not reviewed this encounter.   Odis Luster, FNP 01/09/21 1835

## 2021-01-09 NOTE — Discharge Instructions (Signed)
Please call OB/GYN for further evaluation and management.  You have been prescribed 2 medications to help with symptoms.  Please also use Flonase daily.  Limit medication use if possible due to pregnancy.  COVID-19 test is pending.  We will call if it is positive.  Go to the hospital shortness of breath worsens.

## 2021-01-09 NOTE — ED Triage Notes (Signed)
Pt is [redacted]wks pregnant. Pt c/o cough, sore throat, runny nose, and asthma exacerbation since Friday. States works in a [employer] home and had positive covid exposure. States had a neg covid test at work this am. States using her inhaler without relief. Pt in no distress at this time, speaking in complete sentences.

## 2021-01-10 LAB — NOVEL CORONAVIRUS, NAA: SARS-CoV-2, NAA: NOT DETECTED

## 2021-01-10 LAB — SARS-COV-2, NAA 2 DAY TAT

## 2021-01-11 ENCOUNTER — Encounter: Payer: 59 | Admitting: Obstetrics & Gynecology

## 2021-01-12 LAB — CULTURE, GROUP A STREP (THRC)

## 2021-01-16 ENCOUNTER — Inpatient Hospital Stay (HOSPITAL_COMMUNITY): Payer: Medicaid Other

## 2021-01-16 ENCOUNTER — Inpatient Hospital Stay (HOSPITAL_COMMUNITY)
Admission: AD | Admit: 2021-01-16 | Discharge: 2021-01-16 | Disposition: A | Discharge status: Home / Self Care | Payer: Medicaid Other | Attending: Obstetrics & Gynecology | Admitting: Obstetrics & Gynecology

## 2021-01-16 ENCOUNTER — Encounter (HOSPITAL_COMMUNITY): Payer: Self-pay | Admitting: Obstetrics & Gynecology

## 2021-01-16 DIAGNOSIS — Z20822 Contact with and (suspected) exposure to covid-19: Secondary | ICD-10-CM | POA: Insufficient documentation

## 2021-01-16 DIAGNOSIS — F17210 Nicotine dependence, cigarettes, uncomplicated: Secondary | ICD-10-CM | POA: Insufficient documentation

## 2021-01-16 DIAGNOSIS — R0602 Shortness of breath: Secondary | ICD-10-CM | POA: Diagnosis present

## 2021-01-16 DIAGNOSIS — Z3A33 33 weeks gestation of pregnancy: Secondary | ICD-10-CM

## 2021-01-16 DIAGNOSIS — O99333 Smoking (tobacco) complicating pregnancy, third trimester: Secondary | ICD-10-CM | POA: Insufficient documentation

## 2021-01-16 DIAGNOSIS — J069 Acute upper respiratory infection, unspecified: Secondary | ICD-10-CM | POA: Diagnosis not present

## 2021-01-16 DIAGNOSIS — O99513 Diseases of the respiratory system complicating pregnancy, third trimester: Secondary | ICD-10-CM | POA: Diagnosis not present

## 2021-01-16 LAB — CBC WITH DIFFERENTIAL/PLATELET
Abs Immature Granulocytes: 0.05 10*3/uL (ref 0.00–0.07)
Basophils Absolute: 0 10*3/uL (ref 0.0–0.1)
Basophils Relative: 0 %
Eosinophils Absolute: 0 10*3/uL (ref 0.0–0.5)
Eosinophils Relative: 0 %
HCT: 34.8 % — ABNORMAL LOW (ref 36.0–46.0)
Hemoglobin: 12.2 g/dL (ref 12.0–15.0)
Immature Granulocytes: 1 %
Lymphocytes Relative: 23 %
Lymphs Abs: 2.4 10*3/uL (ref 0.7–4.0)
MCH: 35.5 pg — ABNORMAL HIGH (ref 26.0–34.0)
MCHC: 35.1 g/dL (ref 30.0–36.0)
MCV: 101.2 fL — ABNORMAL HIGH (ref 80.0–100.0)
Monocytes Absolute: 0.8 10*3/uL (ref 0.1–1.0)
Monocytes Relative: 8 %
Neutro Abs: 7.3 10*3/uL (ref 1.7–7.7)
Neutrophils Relative %: 68 %
Platelets: 207 10*3/uL (ref 150–400)
RBC: 3.44 MIL/uL — ABNORMAL LOW (ref 3.87–5.11)
RDW: 13.1 % (ref 11.5–15.5)
WBC: 10.6 10*3/uL — ABNORMAL HIGH (ref 4.0–10.5)
nRBC: 0 % (ref 0.0–0.2)

## 2021-01-16 LAB — RESP PANEL BY RT-PCR (FLU A&B, COVID) ARPGX2
Influenza A by PCR: NEGATIVE
Influenza B by PCR: NEGATIVE
SARS Coronavirus 2 by RT PCR: NEGATIVE

## 2021-01-16 LAB — URINALYSIS, ROUTINE W REFLEX MICROSCOPIC
Bilirubin Urine: NEGATIVE
Glucose, UA: 50 mg/dL — AB
Hgb urine dipstick: NEGATIVE
Ketones, ur: NEGATIVE mg/dL
Leukocytes,Ua: NEGATIVE
Nitrite: NEGATIVE
Protein, ur: NEGATIVE mg/dL
Specific Gravity, Urine: 1.015 (ref 1.005–1.030)
pH: 6 (ref 5.0–8.0)

## 2021-01-16 LAB — COMPREHENSIVE METABOLIC PANEL
ALT: 16 U/L (ref 0–44)
AST: 16 U/L (ref 15–41)
Albumin: 2.2 g/dL — ABNORMAL LOW (ref 3.5–5.0)
Alkaline Phosphatase: 147 U/L — ABNORMAL HIGH (ref 38–126)
Anion gap: 7 (ref 5–15)
BUN: 6 mg/dL (ref 6–20)
CO2: 19 mmol/L — ABNORMAL LOW (ref 22–32)
Calcium: 8.4 mg/dL — ABNORMAL LOW (ref 8.9–10.3)
Chloride: 108 mmol/L (ref 98–111)
Creatinine, Ser: 0.53 mg/dL (ref 0.44–1.00)
GFR, Estimated: >60 mL/min (ref >60)
Glucose, Bld: 131 mg/dL — ABNORMAL HIGH (ref 70–99)
Potassium: 3.2 mmol/L — ABNORMAL LOW (ref 3.5–5.1)
Sodium: 134 mmol/L — ABNORMAL LOW (ref 135–145)
Total Bilirubin: 0.2 mg/dL — ABNORMAL LOW (ref 0.3–1.2)
Total Protein: 5.9 g/dL — ABNORMAL LOW (ref 6.5–8.1)

## 2021-01-16 IMAGING — DX DG CHEST 1V PORT
2 series · 2 of 2 positions shown · non-contrast
Comparison: [DATE]

CLINICAL DATA: Short of breath, nasal congestion, suspected COVID
19

EXAM:
PORTABLE CHEST 1 VIEW

[chest ap (1 of 2)]
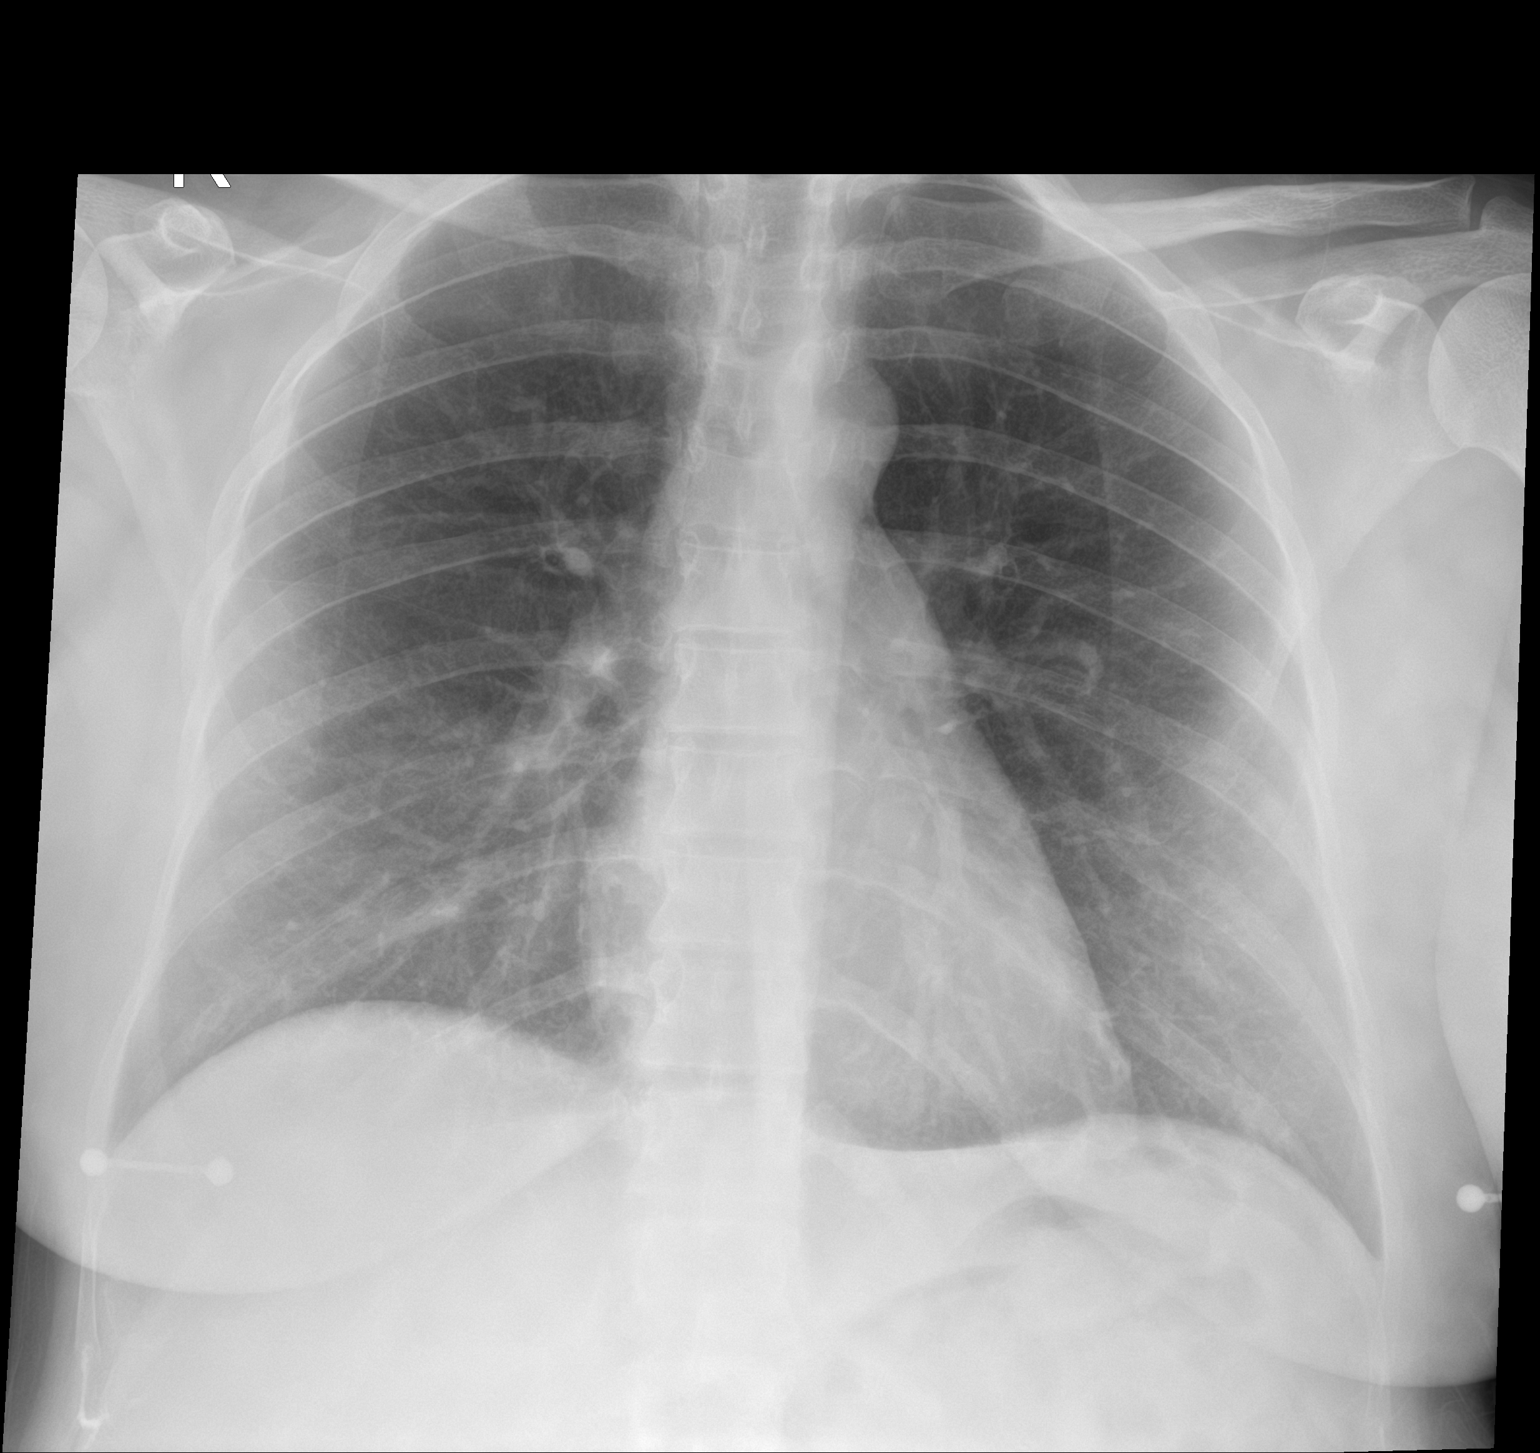

[chest ap (2 of 2)]
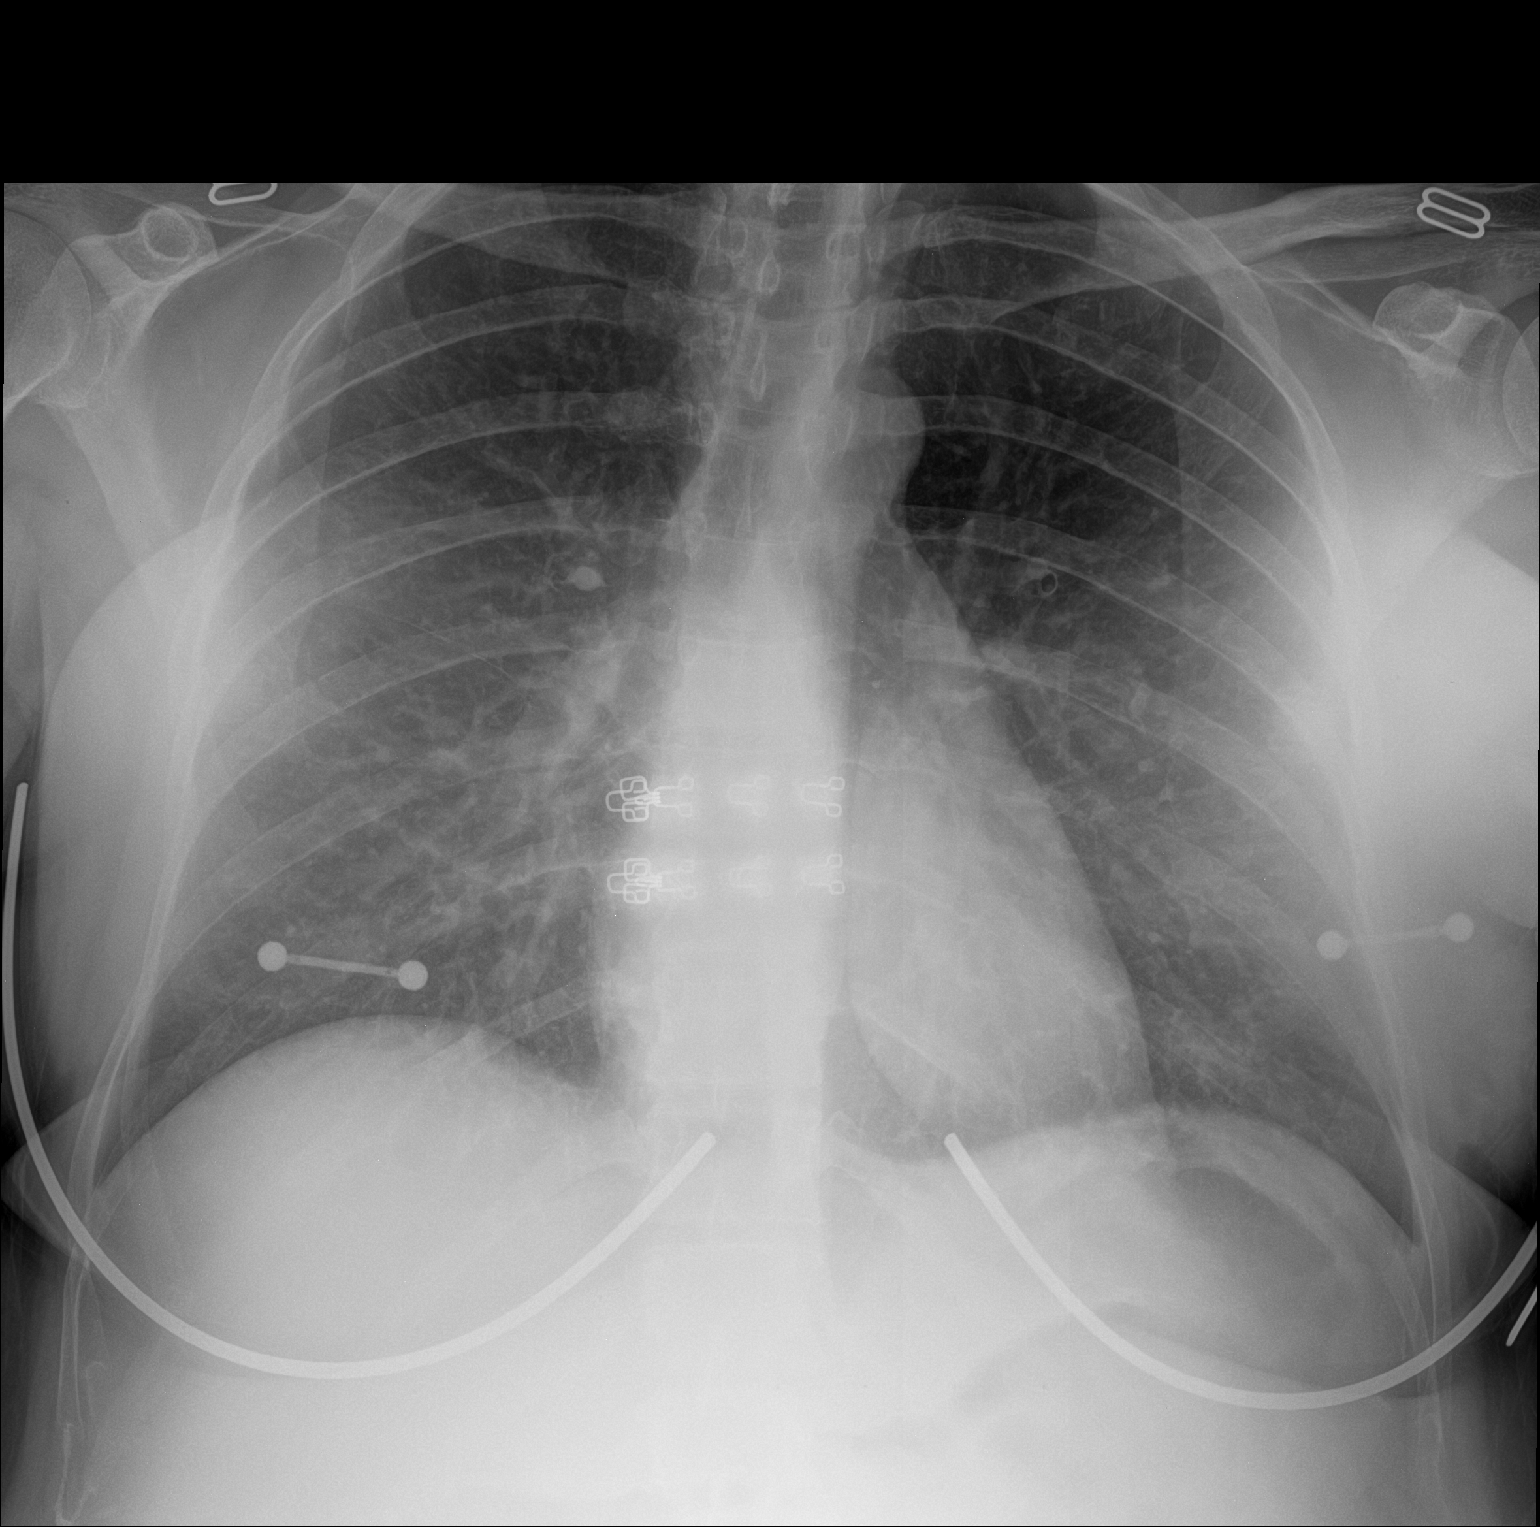

[2 of 2 positions shown; findings below may reference images not displayed]

FINDINGS: 2 frontal views of the chest demonstrate an unremarkable cardiac
silhouette. No acute airspace disease, effusion, or pneumothorax. No
acute bony abnormalities.
IMPRESSION: 1. No acute intrathoracic process.

## 2021-01-16 MED ORDER — BENZONATATE 100 MG PO CAPS: 100.0000 mg | ORAL_CAPSULE | Freq: Three times a day (TID) | ORAL | 1 refills | Status: DC

## 2021-01-16 NOTE — Discharge Instructions (Signed)

## 2021-01-16 NOTE — MAU Provider Note (Addendum)
History     CSN: [id number]  Arrival date and time: 01/16/21 1616   Event Date/Time   First Provider Initiated Contact with Patient 01/16/21 1655      Chief Complaint  Patient presents with   Nasal Congestion   Shortness of Breath   HPI Ann Brown is a 35 y.o. [id number] at 50w6dwho presents with cough, congestion and shortness of breath. This has been ongoing from 9/2. She has tested negative for COVID and strep multiple times. 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 She reports her work will not allow her to come back without a note. She denies any abdominal pain, vaginal bleeding or leaking of fluid. Reports normal fetal movement.    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   GERD (gastroesophageal reflux disease)    Kidney problem    Kideney reflux   Suicidal ideation    Tobacco use    URI 03/01/2009    Past Surgical History:  Procedure Laterality Date   BOWEL RESECTION     CHOLECYSTECTOMY     Complex trunk closure  2005   CSF SHUNT  1987   TRACHEOSTOMY  1987    Family History  Problem Relation Age of Onset   Multiple sclerosis Mother    Cervical cancer Mother    Breast cancer Maternal Grandmother        unsure of age    Social History   Tobacco Use   Smoking status: Every Day    Packs/day: 1.00    Types: Cigarettes, E-cigarettes   Smokeless tobacco: Never   Tobacco comments:    Also uses ecigarette  Vaping Use   Vaping Use: Never used  Substance Use Topics   Alcohol use: No    Alcohol/week: 0.0 standard drinks   Drug use: No    Allergies:  Allergies  Allergen Reactions   Other     Ketorolac Other (See Comments)    Stomach pains     Medications Prior to Admission  Medication Sig Dispense Refill Last Dose   Accu-Chek Softclix Lancets lancets 1 each by Other route 4 (four) times daily. 100 each 12 01/16/2021   amphetamine-dextroamphetamine (ADDERALL) 20 MG tablet Take 20 mg by mouth 2 (two) times daily.   01/16/2021   Blood Glucose Monitoring Suppl (ACCU-CHEK NANO SMARTVIEW) w/Device KIT 1 kit by Subdermal route as directed. Check blood sugars for fasting, and two hours after breakfast, lunch and dinner (4 checks daily) 1 kit 0 01/16/2021   Budeson-Glycopyrrol-Formoterol (BREZTRI AEROSPHERE) 160-9-4.8 MCG/ACT AERO Inhale 1-2 puffs into the lungs in the morning and at bedtime. 10.7 g 0 01/16/2021   buPROPion (WELLBUTRIN SR) 100 MG 12 hr tablet take 1 tablet by oral route every morning   01/16/2021   busPIRone (BUSPAR) 5 MG tablet Take 5 mg by mouth 2 (two)  times daily as needed.   01/16/2021   cetirizine (ZYRTEC) 10 MG tablet Take 1 tablet (10 mg total) by mouth daily. 30 tablet 0 01/16/2021   esomeprazole (NEXIUM) 40 MG capsule Take 1 capsule (40 mg total) by mouth 2 (two) times daily before a meal. 60 capsule 5 01/16/2021   fluticasone (FLONASE) 50 MCG/ACT nasal spray Place 1 spray into both nostrils daily. 11.1 mL 2 01/16/2021   hydrOXYzine (ATARAX/VISTARIL) 10 MG tablet Take 10 mg by mouth at bedtime as needed.   01/16/2021   Prenatal Vit-Fe Fumarate-FA (PREPLUS) 27-1 MG TABS Take 1 tablet by mouth daily. 30 tablet 8 01/15/2021   pseudoephedrine (SUDAFED) 30 MG tablet Take 1 tablet (30 mg total) by mouth every 4 (four) hours as needed for congestion. 30 tablet 0 01/16/2021   albuterol (PROVENTIL) (2.5 MG/3ML) 0.083% nebulizer solution Take 3 mLs (2.5 mg total) by nebulization every 6 (six) hours as needed for wheezing or shortness of breath. 75 mL 0    albuterol (VENTOLIN HFA) 108 (90 Base) MCG/ACT inhaler Inhale 1-2 puffs into the lungs every 6 (six) hours as needed for wheezing  or shortness of breath. 18 g 0    glucose blood (ACCU-CHEK SMARTVIEW) test strip Use as instructed to check blood sugars 100 each 12     Review of Systems  Constitutional: Negative.  Negative for fatigue and fever.  HENT:  Positive for congestion.   Respiratory:  Positive for cough and shortness of breath.   Cardiovascular: Negative.  Negative for chest pain.  Gastrointestinal: Negative.  Negative for abdominal pain, constipation, diarrhea, nausea and vomiting.  Genitourinary: Negative.  Negative for dysuria, vaginal bleeding and vaginal discharge.  Neurological: Negative.  Negative for dizziness and headaches.  Physical Exam   Blood pressure 134/71, pulse (!) 116, temperature 97.7 F (36.5 C), temperature source Oral, resp. rate (!) 22, last menstrual period 05/24/2020, SpO2 100 %.  Physical Exam Vitals and nursing note reviewed.  Constitutional:      General: She is not in acute distress.    Appearance: She is well-developed.  HENT:     Head: Normocephalic.  Eyes:     Pupils: Pupils are equal, round, and reactive to light.  Cardiovascular:     Rate and Rhythm: Normal rate and regular rhythm.     Heart sounds: Normal heart sounds.  Pulmonary:     Effort: Pulmonary effort is normal. No respiratory distress.     Breath sounds: Normal breath sounds.  Abdominal:     General: Bowel sounds are normal. There is no distension.     Palpations: Abdomen is soft.     Tenderness: There is no abdominal tenderness.  Skin:    General: Skin is warm and dry.  Neurological:     Mental Status: She is alert and oriented to person, place, and time.  Psychiatric:        Mood and Affect: Mood normal.        Behavior: Behavior normal.        Thought Content: Thought content normal.        Judgment: Judgment normal.   Fetal Tracing:  Baseline: 140 Variability: moderate Accels: 15x15 Decels: none  Toco: none  MAU Course  Procedures Results for orders placed or performed during the  hospital encounter of 01/16/21 (from the past 24 hour(s))  Urinalysis, Routine w reflex microscopic Urine, Clean Catch     Status: Abnormal   Collection Time: 01/16/21  4:56 PM  Result Value Ref Range  Color, Urine YELLOW YELLOW   APPearance HAZY (A) CLEAR   Specific Gravity, Urine 1.015 1.005 - 1.030   pH 6.0 5.0 - 8.0   Glucose, UA 50 (A) NEGATIVE mg/dL   Hgb urine dipstick NEGATIVE NEGATIVE   Bilirubin Urine NEGATIVE NEGATIVE   Ketones, ur NEGATIVE NEGATIVE mg/dL   Protein, ur NEGATIVE NEGATIVE mg/dL   Nitrite NEGATIVE NEGATIVE   Leukocytes,Ua NEGATIVE NEGATIVE  Resp Panel by RT-PCR (Flu A&B, Covid) Nasopharyngeal Swab     Status: None   Collection Time: 01/16/21  5:01 PM   Specimen: Nasopharyngeal Swab; Nasopharyngeal(NP) swabs in vial transport medium  Result Value Ref Range   SARS Coronavirus 2 by RT PCR NEGATIVE NEGATIVE   Influenza A by PCR NEGATIVE NEGATIVE   Influenza B by PCR NEGATIVE NEGATIVE  CBC with Differential/Platelet     Status: Abnormal   Collection Time: 01/16/21  5:22 PM  Result Value Ref Range   WBC 10.6 (H) 4.0 - 10.5 K/uL   RBC 3.44 (L) 3.87 - 5.11 MIL/uL   Hemoglobin 12.2 12.0 - 15.0 g/dL   HCT 34.8 (L) 36.0 - 46.0 %   MCV 101.2 (H) 80.0 - 100.0 fL   MCH 35.5 (H) 26.0 - 34.0 pg   MCHC 35.1 30.0 - 36.0 g/dL   RDW 13.1 11.5 - 15.5 %   Platelets 207 150 - 400 K/uL   nRBC 0.0 0.0 - 0.2 %   Neutrophils Relative % 68 %   Neutro Abs 7.3 1.7 - 7.7 K/uL   Lymphocytes Relative 23 %   Lymphs Abs 2.4 0.7 - 4.0 K/uL   Monocytes Relative 8 %   Monocytes Absolute 0.8 0.1 - 1.0 K/uL   Eosinophils Relative 0 %   Eosinophils Absolute 0.0 0.0 - 0.5 K/uL   Basophils Relative 0 %   Basophils Absolute 0.0 0.0 - 0.1 K/uL   Immature Granulocytes 1 %   Abs Immature Granulocytes 0.05 0.00 - 0.07 K/uL  Comprehensive metabolic panel     Status: Abnormal   Collection Time: 01/16/21  5:22 PM  Result Value Ref Range   Sodium 134 (L) 135 - 145 mmol/L   Potassium 3.2 (L)  3.5 - 5.1 mmol/L   Chloride 108 98 - 111 mmol/L   CO2 19 (L) 22 - 32 mmol/L   Glucose, Bld 131 (H) 70 - 99 mg/dL   BUN 6 6 - 20 mg/dL   Creatinine, Ser 0.53 0.44 - 1.00 mg/dL   Calcium 8.4 (L) 8.9 - 10.3 mg/dL   Total Protein 5.9 (L) 6.5 - 8.1 g/dL   Albumin 2.2 (L) 3.5 - 5.0 g/dL   AST 16 15 - 41 U/L   ALT 16 0 - 44 U/L   Alkaline Phosphatase 147 (H) 38 - 126 U/L   Total Bilirubin 0.2 (L) 0.3 - 1.2 mg/dL   GFR, Estimated >60 >60 mL/min   Anion gap 7 5 - 15    DG CHEST PORT 1 VIEW  Result Date: 01/16/2021 CLINICAL DATA:  Short of breath, nasal congestion, suspected COVID 19 EXAM: PORTABLE CHEST 1 VIEW COMPARISON:  06/02/2019 FINDINGS: 2 frontal views of the chest demonstrate an unremarkable cardiac silhouette. No acute airspace disease, effusion, or pneumothorax. No acute bony abnormalities. IMPRESSION: 1. No acute intrathoracic process. Electronically Signed   By: Randa Ngo M.D.   On: 01/16/2021 19:05     MDM UA CBC with Diff CMP Chest Xray COVID/Flu test  Assessment and Plan   1. Shortness of breath  2. Viral URI with cough   3. [redacted] weeks gestation of pregnancy    -Discharge home in stable condition -Rx for tessalon perles sent to patient's pharmacy -URI precautions discussed. List of safe medications in pregnancy reviewed -Patient advised to follow-up with OB as scheduled for prenatal care -Patient may return to MAU as needed or if her condition were to change or worsen   Wende Mott [name] 01/16/2021, 4:55 PM

## 2021-01-16 NOTE — MAU Note (Signed)
.  Ann Brown is a 35 y.o. at [redacted]w[redacted]d here in MAU reporting: SOB, cough, nasal congestion, and fluid in ears. States this has been going on x2 weeks. Has had two negative COVID tests. States the urgent care states she has an URI but did not offer any treatment other than pseudoephedrine and zyrtec. Patient has been using her inhalers, has exercise induced asthma. Denies VB or LOF. Endorses good fetal movement.   Vitals:   01/16/21 1644  BP: 134/71  Pulse: (!) 116  Resp: (!) 22  Temp: 97.7 F (36.5 C)  SpO2: 100%     FHT:146 Lab orders placed from triage:  UA

## 2021-01-19 ENCOUNTER — Ambulatory Visit: Payer: Medicaid Other | Attending: Obstetrics

## 2021-01-19 ENCOUNTER — Ambulatory Visit: Payer: Medicaid Other | Admitting: *Deleted

## 2021-01-19 ENCOUNTER — Encounter: Payer: Self-pay | Admitting: Obstetrics and Gynecology

## 2021-01-19 ENCOUNTER — Other Ambulatory Visit: Payer: Self-pay

## 2021-01-19 ENCOUNTER — Encounter: Payer: 59 | Attending: Family | Admitting: Registered"

## 2021-01-19 ENCOUNTER — Telehealth: Payer: Self-pay | Admitting: Obstetrics and Gynecology

## 2021-01-19 ENCOUNTER — Ambulatory Visit (INDEPENDENT_AMBULATORY_CARE_PROVIDER_SITE_OTHER): Payer: 59 | Admitting: Obstetrics and Gynecology

## 2021-01-19 ENCOUNTER — Ambulatory Visit: Payer: 59 | Admitting: Registered"

## 2021-01-19 VITALS — BP 121/77 | HR 118 | Wt 150.0 lb

## 2021-01-19 VITALS — BP 115/76 | HR 114

## 2021-01-19 DIAGNOSIS — O24410 Gestational diabetes mellitus in pregnancy, diet controlled: Secondary | ICD-10-CM | POA: Insufficient documentation

## 2021-01-19 DIAGNOSIS — O99330 Smoking (tobacco) complicating pregnancy, unspecified trimester: Secondary | ICD-10-CM

## 2021-01-19 DIAGNOSIS — O0990 Supervision of high risk pregnancy, unspecified, unspecified trimester: Secondary | ICD-10-CM

## 2021-01-19 DIAGNOSIS — O24419 Gestational diabetes mellitus in pregnancy, unspecified control: Secondary | ICD-10-CM | POA: Insufficient documentation

## 2021-01-19 DIAGNOSIS — R928 Other abnormal and inconclusive findings on diagnostic imaging of breast: Secondary | ICD-10-CM

## 2021-01-19 DIAGNOSIS — O99333 Smoking (tobacco) complicating pregnancy, third trimester: Secondary | ICD-10-CM

## 2021-01-19 DIAGNOSIS — K0889 Other specified disorders of teeth and supporting structures: Secondary | ICD-10-CM

## 2021-01-19 DIAGNOSIS — J9859 Other diseases of mediastinum, not elsewhere classified: Secondary | ICD-10-CM

## 2021-01-19 DIAGNOSIS — Z8639 Personal history of other endocrine, nutritional and metabolic disease: Secondary | ICD-10-CM

## 2021-01-19 DIAGNOSIS — O09523 Supervision of elderly multigravida, third trimester: Secondary | ICD-10-CM

## 2021-01-19 DIAGNOSIS — Z3A34 34 weeks gestation of pregnancy: Secondary | ICD-10-CM | POA: Diagnosis not present

## 2021-01-19 DIAGNOSIS — O09529 Supervision of elderly multigravida, unspecified trimester: Secondary | ICD-10-CM | POA: Insufficient documentation

## 2021-01-19 DIAGNOSIS — O09293 Supervision of pregnancy with other poor reproductive or obstetric history, third trimester: Secondary | ICD-10-CM

## 2021-01-19 DIAGNOSIS — J45909 Unspecified asthma, uncomplicated: Secondary | ICD-10-CM

## 2021-01-19 IMAGING — US US MFM OB FOLLOW-UP
1 series · 13 of 28 positions shown · non-contrast
Comparison: none

[Series 1: us mfm ob follow-up · 13 of 41 slices shown]
[im 2/41]
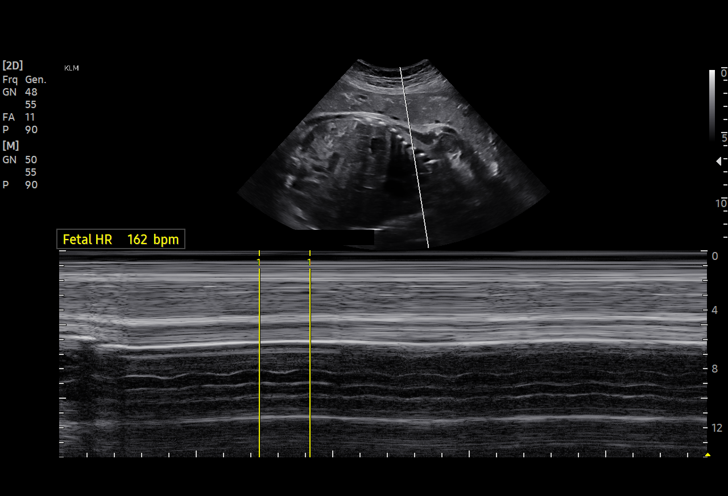
[im 5/41]
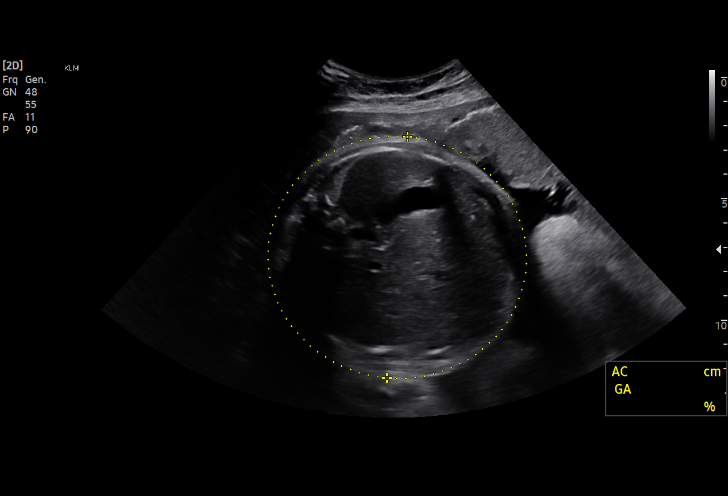
[im 8/41]
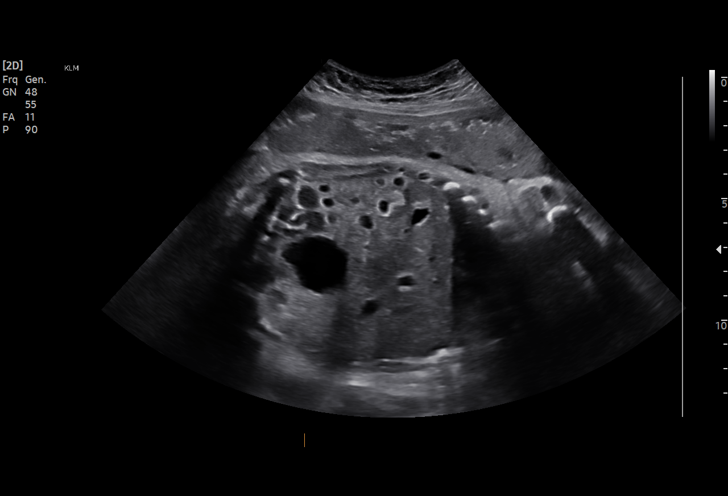
[im 11/41]
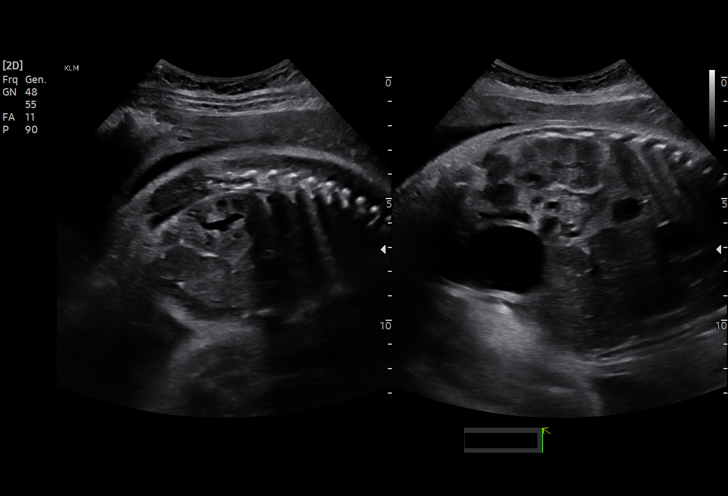
[im 14/41]
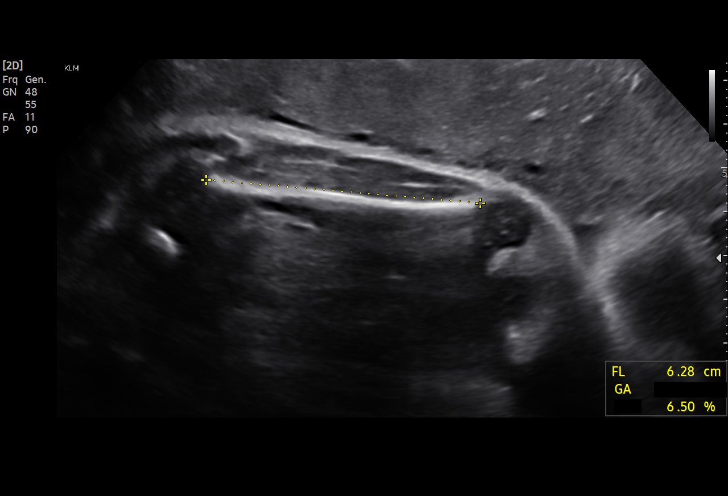
[im 17/41]
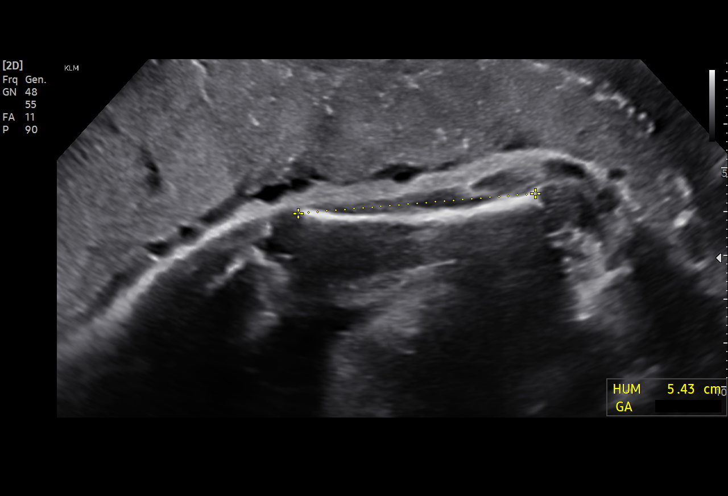
[im 21/41]
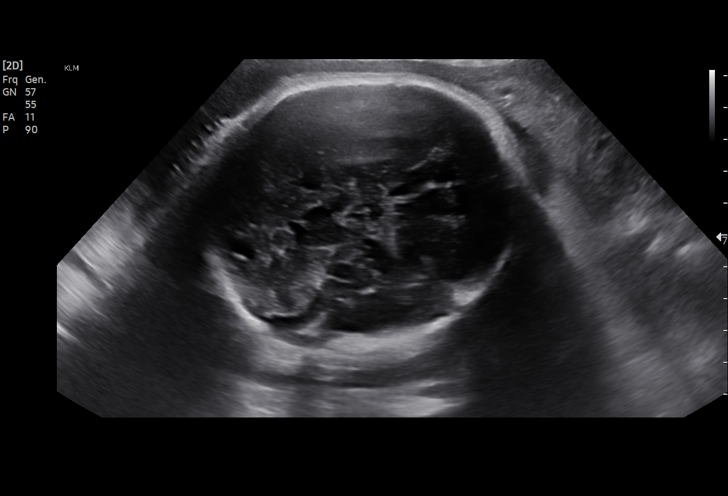
[im 24/41]
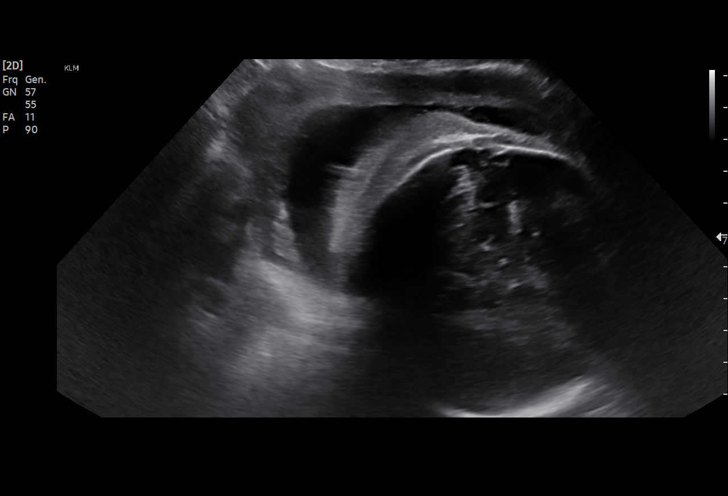
[im 27/41]
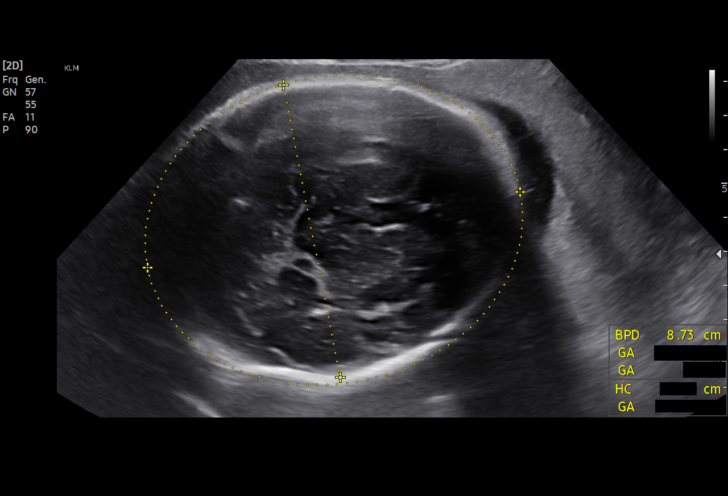
[im 30/41]
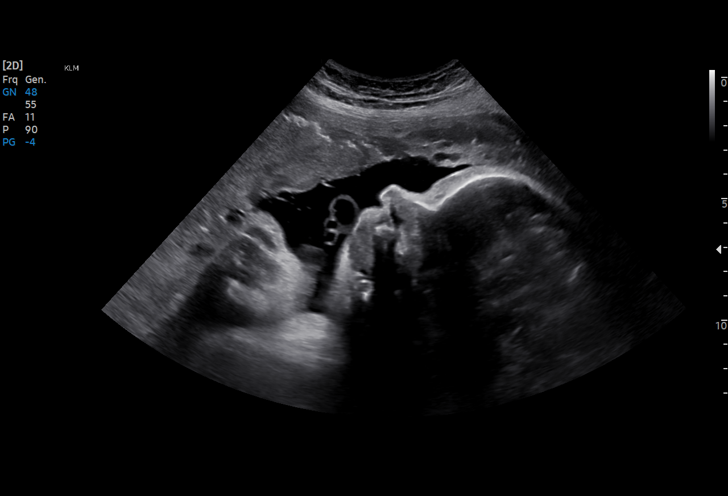
[im 33/41]
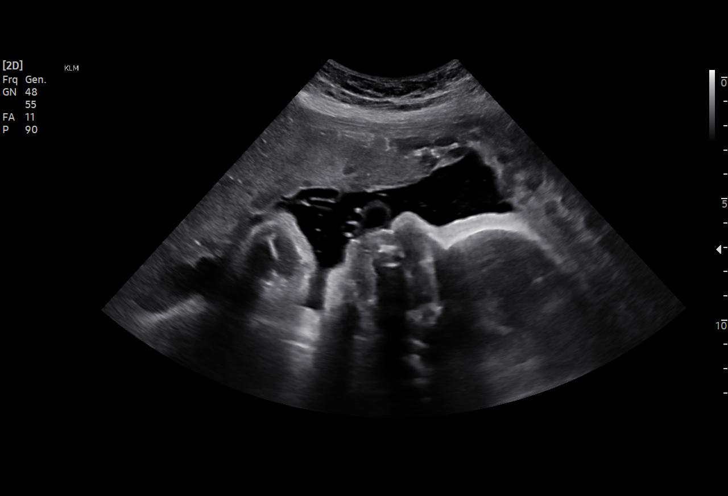
[im 36/41]
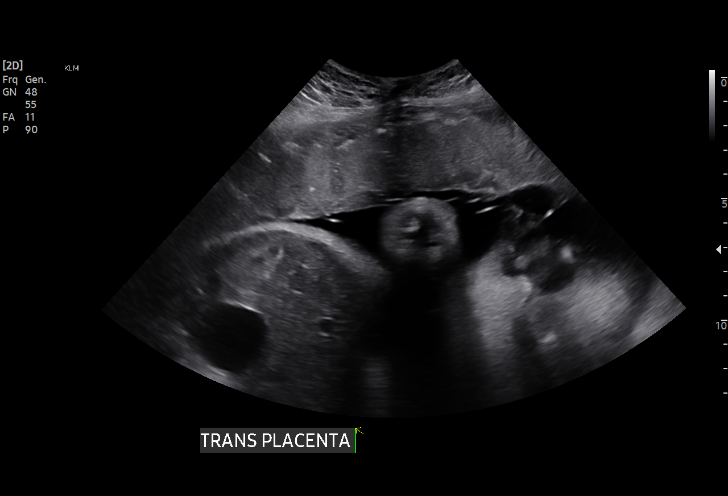
[im 39/41]
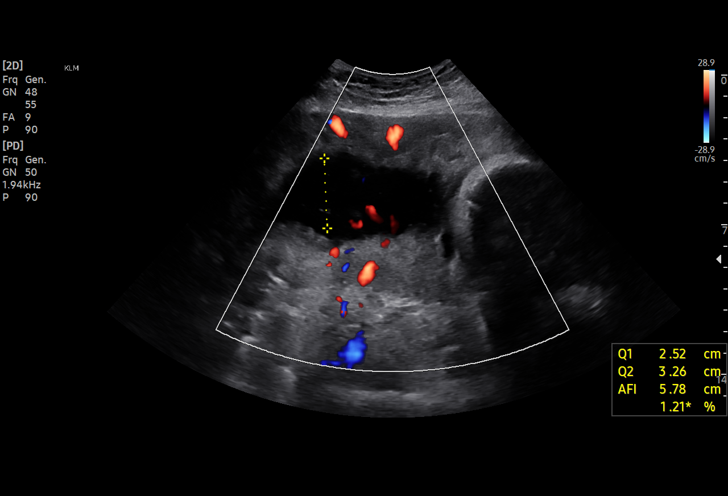

[13 of 28 positions shown; findings below may reference images not displayed]

[facility]                                  [HOSPITAL]
                   [REDACTED]care

Indications

 Advanced maternal age multigravida 35+,        [6Y]
 third trimester
 Smoking complicating pregnancy, third          [6Y]
 trimester
 34 weeks gestation of pregnancy
 LR NIPS  Horizon needs to redraw
 Poor obstetric history: Previous gestational   [6Y]
 diabetes
Fetal Evaluation

 Num Of Fetuses:         1
 Fetal Heart Rate(bpm):  162
 Cardiac Activity:       Observed
 Presentation:           Cephalic
 Placenta:               Anterior
 P. Cord Insertion:      Previously Visualized

 Amniotic Fluid
 AFI FV:      Within normal limits

 AFI Sum(cm)     %Tile       Largest Pocket(cm)
 13.15           43

 RUQ(cm)       RLQ(cm)       LUQ(cm)        LLQ(cm)

Biometry
 BPD:      87.2  mm     G. Age:  35w 1d         75  %    CI:        73.88   %    70 - 86
                                                         FL/HC:      19.5   %    19.4 -
 HC:      322.2  mm     G. Age:  36w 3d         69  %    HC/AC:      1.01        0.96 -
 AC:      320.1  mm     G. Age:  36w 0d         92  %    FL/BPD:     71.9   %    71 - 87
 FL:       62.7  mm     G. Age:  32w 3d          6  %    FL/AC:      19.6   %    20 - 24
 HUM:      54.5  mm     G. Age:  31w 5d          7  %

 Est. FW:    [6Y]  gm    5 lb 11 oz      66  %
OB History

 Gravidity:    4         Term:   2         SAB:   1
 Living:       2
Gestational Age

 LMP:           34w 2d        Date:  [DATE]                 EDD:   [DATE]
 U/S Today:     35w 0d                                        EDD:   [DATE]
 Best:          34w 2d     Det. By:  LMP  ([DATE])          EDD:   [DATE]
Anatomy

 Cranium:               Appears normal         LVOT:                   Previously seen
 Cavum:                 Appears normal         Aortic Arch:            Previously seen
 Ventricles:            Appears normal         Ductal Arch:            Previously seen
 Choroid Plexus:        Appears normal         Diaphragm:              Appears normal
 Cerebellum:            Appears normal         Stomach:                Appears normal, left
                                                                       sided
 Posterior Fossa:       Appears normal         Abdomen:                Previously seen
 Nuchal Fold:           Previously seen        Abdominal Wall:         Previously seen
 Face:                  Orbits and profile     Cord Vessels:           Previously seen
                        previously seen
 Lips:                  Previously seen        Kidneys:                Appear normal
 Palate:                Not well visualized    Bladder:                Appears normal
 Thoracic:              Previously seen        Spine:                  Previously seen
 Heart:                 Previously seen        Upper Extremities:      Previously seen
 RVOT:                  Previously seen        Lower Extremities:      Previously seen

 Other:  Fetus appears to be a male. Nasal bone and lenses previously
         visualized. VC, 3VV and 3VTV previously visualized. Heels/feet and
         open hands/5th digits previously visualized.
Cervix Uterus Adnexa

 Cervix
 Not visualized (advanced GA >[6Y])
Comments

 This patient was seen for a follow-up exam to estimate the
 fetal weight as she had 4th degree perineal laceration during
 the delivery of her first child who weighed 5 pounds 10
 ounces.  Her second child was delivered at term weighing 7
 pounds 9 ounces.  She did not have a 4th degree laceration
 with the delivery of her second child.  She denies any
 problems since her last exam.
 She was informed that the fetal growth and amniotic fluid
 level appears appropriate for her gestational age.
 The patient was advised that based on the EFW obtained
 today, this child will most likely be of similar weight to her
 second child.  She was advised that the only way to avoid
 another fourth degree perineal laceration is to have a
 cesarean delivery.  She will consider a cesarean delivery and
 will discuss this option with you during her future
 appointments.
 No further exams were scheduled in our office.

## 2021-01-19 NOTE — Progress Notes (Signed)
Patient was seen for Gestational Diabetes self-management on 01/19/21  Start time 0815 and End time 0915   Estimated due date: 02/28/21; [redacted]w[redacted]d  Clinical: Medications: reviewed Medical History: GDM A2 (oral med), h/o thyroid disease Labs: OGTT [phone], A1c 5.1%  08/04/20  Dietary and Lifestyle History: Patient states she is having dental issues with this pregnancy, 3 teeth breaking, waiting to get into dentist. Pt states her prenatal with extra calcium and iron.  Patient states when her blood sugar is below 100 she does not feel well and at 85 mg/dL reports symptoms of blurred vision, hot, and feels like she is going to pass out. Pt states she eats something sweet when feeling this way and symptoms resolve. Pt reports her goal is to keep blood sugar 100 or higher.  SMBG: 85-163 mg/dL Patient reports she has not been checking FBS. Patient states checking PPBG but not consistent timing, may be 2-3 hours pc. Pt states before GDM diagnosis once blood sugar was in the 300s.  Patient drinks coffee in the morning, less coffee than before pregnancy, and sometimes has a honey bun with it.  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 Pt states she is having some pelvic pain, has a bone spur and foot issues and is planning to see an orthopedic specialist Stress: high level Sleep: not assessed  24 hr Recall: First Meal: coffee with a little milk and 2-3 tsp sugar Snack: Second meal: eats lunch at work at 12 and checks blood sugar when she gets a break, between 2-3 pm Snack: Third meal: Snack: Beverages:  NUTRITION INTERVENTION  Nutrition education (E-1) on the following topics:   Initial Follow-up  [x]  []  Definition of Gestational Diabetes [x]  []  Why dietary management is important in controlling blood glucose [x]  []  Effects each nutrient has on blood glucose levels []  []  Simple carbohydrates vs complex carbohydrates []  []  Fluid intake [x]  []  Creating a  balanced meal plan []  []  Carbohydrate counting  [x]  []  When to check blood glucose levels [x]  []  Proper blood glucose monitoring techniques [x]  []  Effect of stress and stress reduction techniques  [x]  []  Exercise effect on blood glucose levels, appropriate exercise during pregnancy [x]  []  Importance of limiting caffeine and abstaining from alcohol and smoking [x]  []  Medications used for blood sugar control during pregnancy []  []  Hypoglycemia and rule of 15 [x]  []  Postpartum self care  Patient already has a meter, is testing but not Fasting and 2 hours after each meal.  Patient instructed to monitor glucose levels: FBS: 60 - ? 95 mg/dL (some clinics use 90 for cutoff) 1 hour: ? 140 mg/dL 2 hour: ? mg/dL  Patient received handouts: Nutrition Diabetes and Pregnancy Carbohydrate Counting List  Patient will be seen for starting sample CGM Oct 4.

## 2021-01-19 NOTE — Telephone Encounter (Signed)
Tried to call her but went straight to voicemail in regards to her New scheduled appointment.

## 2021-01-20 ENCOUNTER — Encounter: Payer: Self-pay | Admitting: Obstetrics and Gynecology

## 2021-01-20 DIAGNOSIS — K0889 Other specified disorders of teeth and supporting structures: Secondary | ICD-10-CM | POA: Insufficient documentation

## 2021-01-20 NOTE — Progress Notes (Signed)
PRENATAL VISIT NOTE  Subjective:  Ann Brown is a 35 y.o. [phone] at [redacted]w[redacted]d being seen today for ongoing prenatal care.  She is currently monitored for the following issues for this high-risk pregnancy and has TOBACCO USER; History of thyroid disease; Chronic lower back pain; Mild persistent asthma without complication; Abnormal mammogram; Mass of mediastinum; Supervision of high risk pregnancy, antepartum; AMA (advanced maternal age) multigravida 35+, third trimester; GDM (gestational diabetes mellitus); and Tooth pain on their problem list.  Patient reports  sinus drainage and upper left tooth pain .  Contractions: Irritability. Vag. Bleeding: None.  Movement: Present. Denies leaking of fluid.   The following portions of the patient's history were reviewed and updated as appropriate: allergies, current medications, past family history, past medical history, past social history, past surgical history and problem list.   Objective:   Vitals:   01/19/21 1054  BP: 121/77  Pulse: (!) 118  Weight: 150 lb (68 kg)    Fetal Status:     Movement: Present     General:  Alert, oriented and cooperative. Patient is in no acute distress.  Skin: Skin is warm and dry. No rash noted.   Cardiovascular: Normal heart rate noted  Respiratory: Normal respiratory effort, no problems with respiration noted  Abdomen: Soft, gravid, appropriate for gestational age.  Pain/Pressure: Present     Pelvic: Cervical exam deferred        Extremities: Normal range of motion.  Edema: Trace  Mental Status: Normal mood and affect. Normal behavior. Normal judgment and thought content.   Assessment and Plan:  Pregnancy: [id number] at [redacted]w[redacted]d 1. Supervision of high risk pregnancy, antepartum BTL papers signed today  2. [redacted] weeks gestation of pregnancy  3. Gestational diabetes mellitus (GDM) in third trimester, gestational diabetes method of control unspecified 1st DM education visit today. Pt was checking sugars but  randomly during the day (pt didn't know when to check). Numbers look okay for being random. F/u dm educationvisit next to review CBGs and maybe start the dexcom meter  Has rpt growth u/s later today. Continue qmonth  4. History of thyroid disease Normal TFTs June 2022  5. Mass of mediastinum Needs CT surgery follow up (see care everywhere)  6. AMA (advanced maternal age) multigravida 35+, third trimester  7. Abnormal mammogram Needs surveillance mammogram postpartum  8. Tooth pain I told her that her sinus drainage is likely due to this and importnace of dental hygiene in pregnancy and recommend eval by dentist. She states her primary dentist is out due to surgery and it will be several weeks before that person is back. I asked her to call the office to see if he or she has a back up that is covering their patients but patient states it will take several weeks. I told her I recommend we set her up with a dental appt but she says she has coverage through work and needs to go through that so I recommended doing that and if no success to let us know. I also told her that I recommend being seen in the next week and urgent care precautions given but she states the UC won't help her since she's pregnant so I told her to go to [facility]U instead then  Preterm labor symptoms and general obstetric precautions including but not limited to vaginal bleeding, contractions, leaking of fluid and fetal movement were reviewed in detail with the patient. Please refer to After Visit Summary for other counseling recommendations.   Return  in about 2 weeks (around 02/02/2021) for md visit, in person, high risk ob.  Future Appointments  Date Time Provider Department Center  01/24/2021  4:15 PM [facility]  02/09/2021 10:35 AM Venora Maples, MD [facility]    [place], MD

## 2021-01-24 ENCOUNTER — Other Ambulatory Visit: Payer: Self-pay

## 2021-01-24 ENCOUNTER — Ambulatory Visit: Payer: 59 | Admitting: Registered"

## 2021-01-24 ENCOUNTER — Encounter: Payer: 59 | Attending: Certified Nurse Midwife | Admitting: Registered"

## 2021-01-24 DIAGNOSIS — O24410 Gestational diabetes mellitus in pregnancy, diet controlled: Secondary | ICD-10-CM | POA: Diagnosis not present

## 2021-01-24 DIAGNOSIS — O24419 Gestational diabetes mellitus in pregnancy, unspecified control: Secondary | ICD-10-CM

## 2021-01-24 NOTE — Progress Notes (Signed)
Dexcom G6 Personal CGM Training 01/24/2021  Ann Brown, identified by name and date of birth, is a 35 y.o. female with a diagnosis of Gestational Diabetes.  Start time:1615    End time: 1645 Total time: 30 min  Topics covered:  -Getting to know device    (Receiver:Phone programmed ) -Setting up device (high alert 250 mg/dL, low alert 60 mg/dL ) -Setting alert profile -Inserting sensor -Calibrating- none required for G6 -Ending sensor session -Trouble shooting -Tape guide -Clarity App set up/sharing   Patient has [facility] tech support and my contact information.  Dexcom G6 CGM System Sample provided: Lot [id number]; Exp 09/05/2021

## 2021-02-02 ENCOUNTER — Encounter: Payer: Self-pay | Admitting: Obstetrics and Gynecology

## 2021-02-02 NOTE — Progress Notes (Signed)
Received phone from [facility] with elevated BP and HA. Called pt at [phone]. No answer. Left instructions to come to [facility]U for further evaluation.

## 2021-02-03 ENCOUNTER — Encounter: Payer: Self-pay | Admitting: Registered"

## 2021-02-03 NOTE — Progress Notes (Signed)
Per Documentation from Dr. Alysia Penna 02/02/21 patient reported elevated BP and HA. Per note at 6 pm Dr. Alysia Penna was not able to reach pt and left instructions for Pt to go to go to [facility]U for evaluation.    It may be helpful to know that at initial diabetes education visit on 01/24/21 Pt reported hypoglycemic symptoms at 85 mg/dL. RD started Pt on sample Dexcom to get a better idea of what is happening.  02/01/21: Pt sent RD MyChart msg regarding concerns BG going down to 50s. RD advised patient to eat frequent meals and snacks including carb and protein to see if that would stabilize blood sugar. Pt read msg but did not respond.   02/03/21: From BG data this morning it appears she may be keeping her blood sugar out of the hypoglycemic range now.

## 2021-02-09 ENCOUNTER — Encounter: Payer: 59 | Admitting: Family Medicine

## 2021-02-13 ENCOUNTER — Other Ambulatory Visit: Payer: Self-pay

## 2021-02-13 ENCOUNTER — Ambulatory Visit (INDEPENDENT_AMBULATORY_CARE_PROVIDER_SITE_OTHER): Payer: Medicaid Other | Admitting: Orthopedic Surgery

## 2021-02-13 ENCOUNTER — Encounter: Payer: Self-pay | Admitting: Orthopedic Surgery

## 2021-02-13 DIAGNOSIS — M722 Plantar fascial fibromatosis: Secondary | ICD-10-CM

## 2021-02-13 NOTE — Progress Notes (Signed)
Office Visit Note   Patient: Ann Brown           Date of Birth: [birth year]           MRN: [id number] Visit Date: 02/13/2021              Requested by: Rema Fendt, NP [address] [place],  [place] [address] PCP: Rema Fendt, NP  Chief Complaint  Patient presents with   Left Foot - Pain, Follow-up      HPI:  Patient is a 35 year old woman who is [redacted] weeks pregnant she is going to be scheduled for a C-section in a week.  She complains of plantar fascial pain bilaterally and swelling in both legs. Assessment & Plan: Visit Diagnoses:  1. Plantar fasciitis, bilateral     Plan: Recommended arch supports for the plantar fascia recommended compression stockings bilaterally.  After delivery she could use topical Voltaren gel.  Follow-Up Instructions: Return if symptoms worsen or fail to improve.   Ortho Exam  Patient is alert, oriented, no adenopathy, well-dressed, normal affect, normal respiratory effort. Examination patient has swelling in both legs no signs of cellulitis.  Patient has pain in the mid plantar fashion of the left with pain at the origin of the plantar fascia on the right.  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  Imaging: No results found. No images are attached to the encounter.  Labs: Lab Results  Component Value Date   HGBA1C 5.1 08/04/2020   REPTSTATUS 01/12/2021 FINAL 01/09/2021   CULT  01/09/2021    NO GROUP A STREP (S.PYOGENES) ISOLATED Performed at [facility], [address] [place], [place] [address]    LABORGA NO GROWTH 03/30/2013     Lab Results  Component Value Date   ALBUMIN 2.2 (L) 01/16/2021   ALBUMIN 3.8 07/19/2017   ALBUMIN 4.5 07/06/2016    No results found for: MG No results found for: VD25OH  No results found for: PREALBUMIN CBC EXTENDED Latest Ref Rng & Units 01/16/2021 12/13/2020 08/04/2020  WBC 4.0 - 10.5 K/uL  10.6(H) 10.7 12.1(H)  RBC 3.87 - 5.11 MIL/uL 3.44(L) 3.63(L) 3.97  HGB 12.0 - 15.0 g/dL 53.2 99.2 42.6  HCT 83.4 - 46.0 % 34.8(L) 36.5 40.0  PLT 150 - 400 K/uL 207 228 251  NEUTROABS 1.7 - 7.7 K/uL 7.3 - 8.0(H)  LYMPHSABS 0.7 - 4.0 K/uL 2.4 - 3.3(H)     There is no height or weight on file to calculate BMI.  Orders:  No orders of the defined types were placed in this encounter.  No orders of the defined types were placed in this encounter.    Procedures: No procedures performed  Clinical Data: No additional findings.  ROS:  All other systems negative, except as noted in the HPI. Review of Systems  Objective: Vital Signs: LMP 05/24/2020 (Exact Date)   Specialty Comments:  No specialty comments available.  PMFS History: Patient Active Problem List   Diagnosis Date Noted   Tooth pain 01/20/2021   GDM (gestational diabetes mellitus) 01/19/2021   Supervision of high risk pregnancy, antepartum 08/04/2020   AMA (advanced maternal age) multigravida 35+, third trimester 08/04/2020   Mass of mediastinum 06/30/2019   Abnormal mammogram 01/11/2017   Mild persistent asthma without complication 08/17/2016   Chronic lower back pain 09/24/2014   History of thyroid disease 02/24/2013   TOBACCO USER 04/25/2009  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   GERD (gastroesophageal reflux disease)    History of gestational diabetes 01/21/2013   Kidney problem    Kideney reflux   Suicidal ideation    Tobacco use    URI 03/01/2009    Family History  Problem Relation Age of Onset   Multiple sclerosis Mother    Cervical cancer Mother    Breast cancer Maternal Grandmother        unsure of age    Past Surgical History:  Procedure Laterality Date   BOWEL RESECTION     CHOLECYSTECTOMY     Complex trunk closure  2005   CSF  SHUNT  1987   TRACHEOSTOMY  1987   Social History   Occupational History   Not on file  Tobacco Use   Smoking status: Every Day    Packs/day: 1.00    Types: Cigarettes, E-cigarettes   Smokeless tobacco: Never   Tobacco comments:    Also uses ecigarette  Vaping Use   Vaping Use: Never used  Substance and Sexual Activity   Alcohol use: No    Alcohol/week: 0.0 standard drinks   Drug use: No   Sexual activity: Yes    Birth control/protection: None

## 2021-02-16 ENCOUNTER — Ambulatory Visit (INDEPENDENT_AMBULATORY_CARE_PROVIDER_SITE_OTHER): Payer: Medicaid Other | Admitting: Obstetrics & Gynecology

## 2021-02-16 ENCOUNTER — Other Ambulatory Visit (HOSPITAL_COMMUNITY)
Admission: RE | Admit: 2021-02-16 | Discharge: 2021-02-16 | Disposition: A | Discharge status: Home / Self Care | Payer: 59 | Source: Ambulatory Visit | Attending: Family Medicine | Admitting: Family Medicine

## 2021-02-16 ENCOUNTER — Other Ambulatory Visit: Payer: Self-pay

## 2021-02-16 ENCOUNTER — Encounter: Payer: Self-pay | Admitting: Family Medicine

## 2021-02-16 VITALS — BP 131/83 | HR 108 | Wt 160.5 lb

## 2021-02-16 DIAGNOSIS — J4530 Mild persistent asthma, uncomplicated: Secondary | ICD-10-CM

## 2021-02-16 DIAGNOSIS — O0990 Supervision of high risk pregnancy, unspecified, unspecified trimester: Secondary | ICD-10-CM | POA: Insufficient documentation

## 2021-02-16 DIAGNOSIS — J45909 Unspecified asthma, uncomplicated: Secondary | ICD-10-CM

## 2021-02-16 DIAGNOSIS — O99513 Diseases of the respiratory system complicating pregnancy, third trimester: Secondary | ICD-10-CM

## 2021-02-16 DIAGNOSIS — O24419 Gestational diabetes mellitus in pregnancy, unspecified control: Secondary | ICD-10-CM

## 2021-02-16 MED ORDER — BREZTRI AEROSPHERE 160-9-4.8 MCG/ACT IN AERO: 1.0000 | INHALATION_SPRAY | Freq: Two times a day (BID) | RESPIRATORY_TRACT | 0 refills | Status: DC

## 2021-02-16 NOTE — Progress Notes (Signed)
Pt does not have glucos log

## 2021-02-16 NOTE — Progress Notes (Signed)
   PRENATAL VISIT NOTE  Subjective:  Ann Brown is a 35 y.o. [phone] at [redacted]w[redacted]d being seen today for ongoing prenatal care.  She is currently monitored for the following issues for this high-risk pregnancy and has TOBACCO USER; History of thyroid disease; Chronic lower back pain; Mild persistent asthma without complication; Abnormal mammogram; Mass of mediastinum; Supervision of high risk pregnancy, antepartum; AMA (advanced maternal age) multigravida 35+, third trimester; GDM (gestational diabetes mellitus); and Tooth pain on their problem list.  Patient reports occasional contractions and pressure .  Contractions: Irritability. Vag. Bleeding: None.  Movement: Present. Denies leaking of fluid.   The following portions of the patient's history were reviewed and updated as appropriate: allergies, current medications, past family history, past medical history, past social history, past surgical history and problem list.   Objective:   Vitals:   02/16/21 1325  BP: 131/83  Pulse: (!) 108  Weight: 160 lb 8 oz (72.8 kg)    Fetal Status: Fetal Heart Rate (bpm): 159   Movement: Present  Presentation: Vertex  General:  Alert, oriented and cooperative. Patient is in no acute distress.  Skin: Skin is warm and dry. No rash noted.   Cardiovascular: Normal heart rate noted  Respiratory: Normal respiratory effort, no problems with respiration noted  Abdomen: Soft, gravid, appropriate for gestational age.  Pain/Pressure: Present     Pelvic: Cervical exam performed in the presence of a chaperone Dilation: Fingertip Effacement (%): 20 Station: -3  Extremities: Normal range of motion.  Edema: Trace  Mental Status: Normal mood and affect. Normal behavior. Normal judgment and thought content.   Assessment and Plan:  Pregnancy: [id number] at [redacted]w[redacted]d 1. Supervision of high risk pregnancy, antepartum Routine testing - GC/Chlamydia probe amp ([facility])not at [facility] - Culture, beta strep (group b  only)  2. Asthma affecting pregnancy in third trimester refill - Budeson-Glycopyrrol-Formoterol (BREZTRI AEROSPHERE) 160-9-4.8 MCG/ACT AERO; Inhale 1-2 puffs into the lungs in the morning and at bedtime.  Dispense: 10.7 g; Refill: 0  3. Gestational diabetes mellitus (GDM), antepartum, gestational diabetes method of control unspecified Good control with diet  4. Mild persistent asthma without complication LOng discussion that her previous deliveries were traumatic to her but described at uncomplicated by providers, with 2nd and 1st degree lacerations. She asks about a cesarean that would be strictly elective and vaginal birth would be expected to be successful. She plans BTL. Labor precautions discussed  Term labor symptoms and general obstetric precautions including but not limited to vaginal bleeding, contractions, leaking of fluid and fetal movement were reviewed in detail with the patient. Please refer to After Visit Summary for other counseling recommendations.   Return in about 1 week (around 02/23/2021).  No future appointments.  Scheryl Darter, MD

## 2021-02-17 LAB — GC/CHLAMYDIA PROBE AMP (~~LOC~~) NOT AT ARMC
Chlamydia: NEGATIVE
Comment: NEGATIVE
Comment: NORMAL
Neisseria Gonorrhea: NEGATIVE

## 2021-02-19 LAB — CULTURE, BETA STREP (GROUP B ONLY): Strep Gp B Culture: NEGATIVE

## 2021-02-22 ENCOUNTER — Other Ambulatory Visit: Payer: Self-pay

## 2021-02-23 ENCOUNTER — Ambulatory Visit (INDEPENDENT_AMBULATORY_CARE_PROVIDER_SITE_OTHER): Payer: 59 | Admitting: Obstetrics & Gynecology

## 2021-02-23 ENCOUNTER — Other Ambulatory Visit: Payer: Self-pay

## 2021-02-23 VITALS — BP 132/86 | HR 128 | Wt 160.2 lb

## 2021-02-23 DIAGNOSIS — O0990 Supervision of high risk pregnancy, unspecified, unspecified trimester: Secondary | ICD-10-CM

## 2021-02-23 DIAGNOSIS — O09523 Supervision of elderly multigravida, third trimester: Secondary | ICD-10-CM

## 2021-02-23 DIAGNOSIS — O24419 Gestational diabetes mellitus in pregnancy, unspecified control: Secondary | ICD-10-CM

## 2021-02-23 NOTE — Progress Notes (Signed)
   PRENATAL VISIT NOTE  Subjective:  Ann Brown is a 35 y.o. [phone] at [redacted]w[redacted]d being seen today for ongoing prenatal care.  She is currently monitored for the following issues for this high-risk pregnancy and has TOBACCO USER; History of thyroid disease; Chronic lower back pain; Mild persistent asthma without complication; Abnormal mammogram; Mass of mediastinum; Supervision of high risk pregnancy, antepartum; AMA (advanced maternal age) multigravida 35+, third trimester; GDM (gestational diabetes mellitus); and Tooth pain on their problem list.  Patient reports occasional contractions.  Contractions: Irritability. Vag. Bleeding: None.  Movement: Present. Denies leaking of fluid.   The following portions of the patient's history were reviewed and updated as appropriate: allergies, current medications, past family history, past medical history, past social history, past surgical history and problem list.   Objective:   Vitals:   02/23/21 1545  BP: 132/86  Pulse: (!) 128  Weight: 160 lb 3.2 oz (72.7 kg)    Fetal Status: Fetal Heart Rate (bpm): 134   Movement: Present  Presentation: Vertex  General:  Alert, oriented and cooperative. Patient is in no acute distress.  Skin: Skin is warm and dry. No rash noted.   Cardiovascular: Normal heart rate noted  Respiratory: Normal respiratory effort, no problems with respiration noted  Abdomen: Soft, gravid, appropriate for gestational age.  Pain/Pressure: Present     Pelvic: Cervical exam performed in the presence of a chaperone Dilation: 2 Effacement (%): 40 Station: -3  Extremities: Normal range of motion.  Edema: Trace  Mental Status: Normal mood and affect. Normal behavior. Normal judgment and thought content.   Assessment and Plan:  Pregnancy: [id number] at [redacted]w[redacted]d 1. Gestational diabetes mellitus (GDM), antepartum, gestational diabetes method of control unspecified Good control  2. AMA (advanced maternal age) multigravida 35+, third  trimester   3. Supervision of high risk pregnancy, antepartum IOL by 40 weeks  Term labor symptoms and general obstetric precautions including but not limited to vaginal bleeding, contractions, leaking of fluid and fetal movement were reviewed in detail with the patient. Please refer to After Visit Summary for other counseling recommendations.   Return if symptoms worsen or fail to improve.  No future appointments.  Scheryl Darter, MD

## 2021-02-24 ENCOUNTER — Telehealth (HOSPITAL_COMMUNITY): Payer: Self-pay | Admitting: *Deleted

## 2021-02-24 NOTE — Telephone Encounter (Signed)
Preadmission screen  

## 2021-02-25 ENCOUNTER — Encounter (HOSPITAL_COMMUNITY)
Admission: AD | Disposition: A | Discharge status: Home / Self Care | Payer: Self-pay | Source: Home / Self Care | Attending: Obstetrics and Gynecology

## 2021-02-25 ENCOUNTER — Encounter (HOSPITAL_COMMUNITY): Payer: Self-pay | Admitting: Obstetrics and Gynecology

## 2021-02-25 ENCOUNTER — Inpatient Hospital Stay (HOSPITAL_COMMUNITY): Payer: Medicaid Other | Admitting: Anesthesiology

## 2021-02-25 ENCOUNTER — Other Ambulatory Visit: Payer: Self-pay

## 2021-02-25 ENCOUNTER — Inpatient Hospital Stay (HOSPITAL_COMMUNITY)
Admission: AD | Admit: 2021-02-25 | Discharge: 2021-02-27 | DRG: 784 | Disposition: A | Discharge status: Home / Self Care | Payer: Medicaid Other | Attending: Obstetrics and Gynecology | Admitting: Obstetrics and Gynecology

## 2021-02-25 DIAGNOSIS — O99334 Smoking (tobacco) complicating childbirth: Secondary | ICD-10-CM | POA: Diagnosis present

## 2021-02-25 DIAGNOSIS — O26893 Other specified pregnancy related conditions, third trimester: Secondary | ICD-10-CM | POA: Diagnosis present

## 2021-02-25 DIAGNOSIS — D62 Acute posthemorrhagic anemia: Secondary | ICD-10-CM | POA: Diagnosis not present

## 2021-02-25 DIAGNOSIS — Z98891 History of uterine scar from previous surgery: Secondary | ICD-10-CM

## 2021-02-25 DIAGNOSIS — Z302 Encounter for sterilization: Secondary | ICD-10-CM

## 2021-02-25 DIAGNOSIS — J4530 Mild persistent asthma, uncomplicated: Secondary | ICD-10-CM | POA: Diagnosis present

## 2021-02-25 DIAGNOSIS — O368330 Maternal care for abnormalities of the fetal heart rate or rhythm, third trimester, not applicable or unspecified: Secondary | ICD-10-CM

## 2021-02-25 DIAGNOSIS — O24420 Gestational diabetes mellitus in childbirth, diet controlled: Secondary | ICD-10-CM

## 2021-02-25 DIAGNOSIS — O9081 Anemia of the puerperium: Secondary | ICD-10-CM | POA: Diagnosis not present

## 2021-02-25 DIAGNOSIS — O0990 Supervision of high risk pregnancy, unspecified, unspecified trimester: Secondary | ICD-10-CM

## 2021-02-25 DIAGNOSIS — O09523 Supervision of elderly multigravida, third trimester: Secondary | ICD-10-CM | POA: Diagnosis present

## 2021-02-25 DIAGNOSIS — Z3A39 39 weeks gestation of pregnancy: Secondary | ICD-10-CM

## 2021-02-25 DIAGNOSIS — Z8639 Personal history of other endocrine, nutritional and metabolic disease: Secondary | ICD-10-CM

## 2021-02-25 DIAGNOSIS — O24419 Gestational diabetes mellitus in pregnancy, unspecified control: Secondary | ICD-10-CM | POA: Diagnosis present

## 2021-02-25 DIAGNOSIS — O9952 Diseases of the respiratory system complicating childbirth: Secondary | ICD-10-CM | POA: Diagnosis present

## 2021-02-25 DIAGNOSIS — R928 Other abnormal and inconclusive findings on diagnostic imaging of breast: Secondary | ICD-10-CM | POA: Diagnosis present

## 2021-02-25 DIAGNOSIS — F17210 Nicotine dependence, cigarettes, uncomplicated: Secondary | ICD-10-CM | POA: Diagnosis present

## 2021-02-25 HISTORY — PX: TUBAL LIGATION: SHX77

## 2021-02-25 LAB — GLUCOSE, CAPILLARY: Glucose-Capillary: 107 mg/dL — ABNORMAL HIGH (ref 70–99)

## 2021-02-25 SURGERY — Surgical Case
Anesthesia: General

## 2021-02-25 MED ORDER — OXYCODONE HCL 5 MG PO TABS
5.0000 mg | ORAL_TABLET | ORAL | Status: DC | PRN
  Administered 2021-02-26 – 2021-02-27 (×7): 5 mg via ORAL
  Filled 2021-02-25 (×7): qty 1

## 2021-02-25 MED ORDER — SCOPOLAMINE 1 MG/3DAYS TD PT72
MEDICATED_PATCH | TRANSDERMAL | Status: AC
  Filled 2021-02-25: qty 1

## 2021-02-25 MED ORDER — ALBUTEROL SULFATE (2.5 MG/3ML) 0.083% IN NEBU: 3.0000 mL | INHALATION_SOLUTION | Freq: Four times a day (QID) | RESPIRATORY_TRACT | Status: DC | PRN

## 2021-02-25 MED ORDER — BUSPIRONE HCL 5 MG PO TABS: 5.0000 mg | ORAL_TABLET | Freq: Two times a day (BID) | ORAL | Status: DC | PRN

## 2021-02-25 MED ORDER — SIMETHICONE 80 MG PO CHEW: 80.0000 mg | CHEWABLE_TABLET | ORAL | Status: DC | PRN

## 2021-02-25 MED ORDER — HYDROMORPHONE HCL 1 MG/ML IJ SOLN
INTRAMUSCULAR | Status: AC
  Filled 2021-02-25: qty 1

## 2021-02-25 MED ORDER — CEFAZOLIN SODIUM-DEXTROSE 2-4 GM/100ML-% IV SOLN
INTRAVENOUS | Status: AC
  Filled 2021-02-25: qty 100

## 2021-02-25 MED ORDER — FLUTICASONE PROPIONATE 50 MCG/ACT NA SUSP
1.0000 | Freq: Every day | NASAL | Status: DC
Start: 2021-02-25 — End: 2021-02-27
  Administered 2021-02-26: 1 via NASAL
  Filled 2021-02-25: qty 16

## 2021-02-25 MED ORDER — PANTOPRAZOLE SODIUM 40 MG PO TBEC
40.0000 mg | DELAYED_RELEASE_TABLET | Freq: Every day | ORAL | Status: DC
  Administered 2021-02-26 – 2021-02-27 (×2): 40 mg via ORAL
  Filled 2021-02-25 (×2): qty 1

## 2021-02-25 MED ORDER — PROPOFOL 10 MG/ML IV BOLUS
INTRAVENOUS | Status: AC
  Filled 2021-02-25: qty 20

## 2021-02-25 MED ORDER — MOMETASONE FURO-FORMOTEROL FUM 100-5 MCG/ACT IN AERO
2.0000 | INHALATION_SPRAY | Freq: Two times a day (BID) | RESPIRATORY_TRACT | Status: DC
  Filled 2021-02-25: qty 8.8

## 2021-02-25 MED ORDER — TETANUS-DIPHTH-ACELL PERTUSSIS 5-2.5-18.5 LF-MCG/0.5 IM SUSY: 0.5000 mL | PREFILLED_SYRINGE | Freq: Once | INTRAMUSCULAR | Status: DC

## 2021-02-25 MED ORDER — ACETAMINOPHEN 325 MG PO TABS
650.0000 mg | ORAL_TABLET | ORAL | Status: DC | PRN
  Administered 2021-02-26: 650 mg via ORAL
  Filled 2021-02-25 (×2): qty 2

## 2021-02-25 MED ORDER — FENTANYL CITRATE (PF) 100 MCG/2ML IJ SOLN
25.0000 ug | INTRAMUSCULAR | Status: DC | PRN
  Administered 2021-02-25 (×3): 50 ug via INTRAVENOUS

## 2021-02-25 MED ORDER — DIBUCAINE (PERIANAL) 1 % EX OINT: 1.0000 "application " | TOPICAL_OINTMENT | CUTANEOUS | Status: DC | PRN

## 2021-02-25 MED ORDER — HYDROMORPHONE HCL 1 MG/ML IJ SOLN
INTRAMUSCULAR | Status: DC | PRN
  Administered 2021-02-25 (×2): 1 mg via INTRAVENOUS

## 2021-02-25 MED ORDER — ONDANSETRON HCL 4 MG/2ML IJ SOLN
INTRAMUSCULAR | Status: DC | PRN
  Administered 2021-02-25: 4 mg via INTRAVENOUS

## 2021-02-25 MED ORDER — DIPHENHYDRAMINE HCL 25 MG PO CAPS: 25.0000 mg | ORAL_CAPSULE | Freq: Four times a day (QID) | ORAL | Status: DC | PRN

## 2021-02-25 MED ORDER — SODIUM CHLORIDE 0.9 % IV SOLN
INTRAVENOUS | Status: DC | PRN
  Administered 2021-02-25: 500 mg via INTRAVENOUS

## 2021-02-25 MED ORDER — FENTANYL CITRATE (PF) 100 MCG/2ML IJ SOLN
INTRAMUSCULAR | Status: DC | PRN
  Administered 2021-02-25: 50 ug via INTRAVENOUS
  Administered 2021-02-25: 250 ug via INTRAVENOUS
  Administered 2021-02-25 (×3): 50 ug via INTRAVENOUS

## 2021-02-25 MED ORDER — ONDANSETRON HCL 4 MG/2ML IJ SOLN: 4.0000 mg | Freq: Four times a day (QID) | INTRAMUSCULAR | Status: DC | PRN

## 2021-02-25 MED ORDER — LACTATED RINGERS IV SOLN: INTRAVENOUS | Status: DC | PRN

## 2021-02-25 MED ORDER — PROMETHAZINE HCL 25 MG/ML IJ SOLN: 6.2500 mg | INTRAMUSCULAR | Status: DC | PRN

## 2021-02-25 MED ORDER — AMPHETAMINE-DEXTROAMPHETAMINE 10 MG PO TABS
20.0000 mg | ORAL_TABLET | Freq: Two times a day (BID) | ORAL | Status: DC
  Administered 2021-02-26 – 2021-02-27 (×3): 20 mg via ORAL
  Filled 2021-02-25 (×4): qty 2

## 2021-02-25 MED ORDER — FENTANYL CITRATE (PF) 250 MCG/5ML IJ SOLN
INTRAMUSCULAR | Status: AC
  Filled 2021-02-25: qty 5

## 2021-02-25 MED ORDER — FENTANYL CITRATE (PF) 100 MCG/2ML IJ SOLN
INTRAMUSCULAR | Status: AC
  Filled 2021-02-25: qty 2

## 2021-02-25 MED ORDER — ONDANSETRON HCL 4 MG/2ML IJ SOLN
INTRAMUSCULAR | Status: AC
  Filled 2021-02-25: qty 2

## 2021-02-25 MED ORDER — UMECLIDINIUM BROMIDE 62.5 MCG/ACT IN AEPB
1.0000 | INHALATION_SPRAY | Freq: Every day | RESPIRATORY_TRACT | Status: DC
Start: 2021-02-25 — End: 2021-02-25
  Filled 2021-02-25: qty 7

## 2021-02-25 MED ORDER — CEFAZOLIN SODIUM-DEXTROSE 2-3 GM-%(50ML) IV SOLR
INTRAVENOUS | Status: DC | PRN
  Administered 2021-02-25: 2 g via INTRAVENOUS

## 2021-02-25 MED ORDER — SCOPOLAMINE 1 MG/3DAYS TD PT72
MEDICATED_PATCH | TRANSDERMAL | Status: DC | PRN
  Administered 2021-02-25: 1 via TRANSDERMAL

## 2021-02-25 MED ORDER — BUPROPION HCL ER (SR) 100 MG PO TB12
100.0000 mg | ORAL_TABLET | Freq: Every morning | ORAL | Status: DC
  Filled 2021-02-25 (×4): qty 1

## 2021-02-25 MED ORDER — SUCCINYLCHOLINE CHLORIDE 200 MG/10ML IV SOSY
PREFILLED_SYRINGE | INTRAVENOUS | Status: AC
  Filled 2021-02-25: qty 10

## 2021-02-25 MED ORDER — SODIUM CHLORIDE 0.9% FLUSH: 9.0000 mL | INTRAVENOUS | Status: DC | PRN

## 2021-02-25 MED ORDER — OXYTOCIN-SODIUM CHLORIDE 30-0.9 UT/500ML-% IV SOLN
2.5000 [IU]/h | INTRAVENOUS | Status: AC
  Filled 2021-02-25: qty 500

## 2021-02-25 MED ORDER — MENTHOL 3 MG MT LOZG
1.0000 | LOZENGE | OROMUCOSAL | Status: DC | PRN
  Administered 2021-02-26: 3 mg via ORAL
  Filled 2021-02-25: qty 9

## 2021-02-25 MED ORDER — DEXAMETHASONE SODIUM PHOSPHATE 4 MG/ML IJ SOLN
INTRAMUSCULAR | Status: AC
  Filled 2021-02-25: qty 1

## 2021-02-25 MED ORDER — NICOTINE 14 MG/24HR TD PT24
14.0000 mg | MEDICATED_PATCH | Freq: Every day | TRANSDERMAL | Status: DC
  Administered 2021-02-25 – 2021-02-26 (×2): 14 mg via TRANSDERMAL
  Filled 2021-02-25 (×2): qty 1

## 2021-02-25 MED ORDER — DIPHENHYDRAMINE HCL 50 MG/ML IJ SOLN: 12.5000 mg | Freq: Four times a day (QID) | INTRAMUSCULAR | Status: DC | PRN

## 2021-02-25 MED ORDER — MIDAZOLAM HCL 2 MG/2ML IJ SOLN
INTRAMUSCULAR | Status: AC
  Filled 2021-02-25: qty 2

## 2021-02-25 MED ORDER — SENNOSIDES-DOCUSATE SODIUM 8.6-50 MG PO TABS
2.0000 | ORAL_TABLET | Freq: Every day | ORAL | Status: DC
  Administered 2021-02-26 – 2021-02-27 (×2): 2 via ORAL
  Filled 2021-02-25 (×2): qty 2

## 2021-02-25 MED ORDER — ACETAMINOPHEN 10 MG/ML IV SOLN
INTRAVENOUS | Status: AC
  Filled 2021-02-25: qty 100

## 2021-02-25 MED ORDER — ACETAMINOPHEN 10 MG/ML IV SOLN
INTRAVENOUS | Status: DC | PRN
  Administered 2021-02-25: 1000 mg via INTRAVENOUS

## 2021-02-25 MED ORDER — SUCCINYLCHOLINE CHLORIDE 200 MG/10ML IV SOSY
PREFILLED_SYRINGE | INTRAVENOUS | Status: DC | PRN
  Administered 2021-02-25: 100 mg via INTRAVENOUS

## 2021-02-25 MED ORDER — ENOXAPARIN SODIUM 40 MG/0.4ML IJ SOSY
40.0000 mg | PREFILLED_SYRINGE | INTRAMUSCULAR | Status: DC
  Administered 2021-02-26 – 2021-02-27 (×2): 40 mg via SUBCUTANEOUS
  Filled 2021-02-25 (×2): qty 0.4

## 2021-02-25 MED ORDER — DIPHENHYDRAMINE HCL 12.5 MG/5ML PO ELIX
12.5000 mg | ORAL_SOLUTION | Freq: Four times a day (QID) | ORAL | Status: DC | PRN
  Filled 2021-02-25: qty 5

## 2021-02-25 MED ORDER — WITCH HAZEL-GLYCERIN EX PADS: 1.0000 "application " | MEDICATED_PAD | CUTANEOUS | Status: DC | PRN

## 2021-02-25 MED ORDER — PRENATAL MULTIVITAMIN CH
1.0000 | ORAL_TABLET | Freq: Every day | ORAL | Status: DC
  Administered 2021-02-26 – 2021-02-27 (×2): 1 via ORAL
  Filled 2021-02-25 (×2): qty 1

## 2021-02-25 MED ORDER — NALOXONE HCL 0.4 MG/ML IJ SOLN
0.4000 mg | INTRAMUSCULAR | Status: DC | PRN
  Filled 2021-02-25: qty 1

## 2021-02-25 MED ORDER — IBUPROFEN 800 MG PO TABS
ORAL_TABLET | ORAL | Status: AC
  Filled 2021-02-25: qty 1

## 2021-02-25 MED ORDER — PROPOFOL 10 MG/ML IV BOLUS
INTRAVENOUS | Status: DC | PRN
  Administered 2021-02-25: 150 mg via INTRAVENOUS

## 2021-02-25 MED ORDER — PREPLUS 27-1 MG PO TABS: 1.0000 | ORAL_TABLET | Freq: Every day | ORAL | Status: DC

## 2021-02-25 MED ORDER — DEXAMETHASONE SODIUM PHOSPHATE 4 MG/ML IJ SOLN
INTRAMUSCULAR | Status: DC | PRN
  Administered 2021-02-25: 4 mg via INTRAVENOUS

## 2021-02-25 MED ORDER — OXYTOCIN-SODIUM CHLORIDE 30-0.9 UT/500ML-% IV SOLN
INTRAVENOUS | Status: DC | PRN
  Administered 2021-02-25: 30 [IU] via INTRAVENOUS

## 2021-02-25 MED ORDER — LIDOCAINE 2% (20 MG/ML) 5 ML SYRINGE
INTRAMUSCULAR | Status: AC
  Filled 2021-02-25: qty 5

## 2021-02-25 MED ORDER — ZOLPIDEM TARTRATE 5 MG PO TABS: 5.0000 mg | ORAL_TABLET | Freq: Every evening | ORAL | Status: DC | PRN

## 2021-02-25 MED ORDER — BUDESON-GLYCOPYRROL-FORMOTEROL 160-9-4.8 MCG/ACT IN AERO: 1.0000 | INHALATION_SPRAY | Freq: Two times a day (BID) | RESPIRATORY_TRACT | Status: DC

## 2021-02-25 MED ORDER — MORPHINE SULFATE 1 MG/ML IV SOLN PCA
INTRAVENOUS | Status: DC
  Administered 2021-02-25: 1 mL via INTRAVENOUS
  Filled 2021-02-25 (×5): qty 30

## 2021-02-25 MED ORDER — IBUPROFEN 800 MG PO TABS
800.0000 mg | ORAL_TABLET | Freq: Once | ORAL | Status: AC
  Administered 2021-02-25: 800 mg via ORAL

## 2021-02-25 MED ORDER — SIMETHICONE 80 MG PO CHEW
80.0000 mg | CHEWABLE_TABLET | Freq: Three times a day (TID) | ORAL | Status: DC
  Administered 2021-02-26 – 2021-02-27 (×4): 80 mg via ORAL
  Filled 2021-02-25 (×4): qty 1

## 2021-02-25 MED ORDER — MIDAZOLAM HCL 2 MG/2ML IJ SOLN
INTRAMUSCULAR | Status: DC | PRN
  Administered 2021-02-25: 2 mg via INTRAVENOUS

## 2021-02-25 MED ORDER — COCONUT OIL OIL: 1.0000 "application " | TOPICAL_OIL | Status: DC | PRN

## 2021-02-25 SURGICAL SUPPLY — 35 items
BENZOIN TINCTURE PRP APPL 2/3 (GAUZE/BANDAGES/DRESSINGS) ×3 IMPLANT
CHLORAPREP W/TINT 26ML (MISCELLANEOUS) ×3 IMPLANT
CLAMP CORD UMBIL (MISCELLANEOUS) IMPLANT
CLOTH BEACON ORANGE TIMEOUT ST (SAFETY) ×3 IMPLANT
DERMABOND ADVANCED (GAUZE/BANDAGES/DRESSINGS)
DERMABOND ADVANCED .7 DNX12 (GAUZE/BANDAGES/DRESSINGS) IMPLANT
DRSG OPSITE POSTOP 4X10 (GAUZE/BANDAGES/DRESSINGS) ×3 IMPLANT
ELECT REM PT RETURN 9FT ADLT (ELECTROSURGICAL) ×3
ELECTRODE REM PT RTRN 9FT ADLT (ELECTROSURGICAL) ×2 IMPLANT
EXTRACTOR VACUUM KIWI (MISCELLANEOUS) IMPLANT
GLOVE BIOGEL PI IND STRL 7.0 (GLOVE) ×6 IMPLANT
GLOVE BIOGEL PI INDICATOR 7.0 (GLOVE) ×3
GLOVE ECLIPSE 6.5 STRL STRAW (GLOVE) ×3 IMPLANT
GOWN STRL REUS W/TWL LRG LVL3 (GOWN DISPOSABLE) ×9 IMPLANT
KIT ABG SYR 3ML LUER SLIP (SYRINGE) IMPLANT
NEEDLE HYPO 25X5/8 SAFETYGLIDE (NEEDLE) IMPLANT
NS IRRIG 1000ML POUR BTL (IV SOLUTION) ×3 IMPLANT
PACK C SECTION WH (CUSTOM PROCEDURE TRAY) ×3 IMPLANT
PAD ABD 7.5X8 STRL (GAUZE/BANDAGES/DRESSINGS) ×3 IMPLANT
PAD OB MATERNITY 4.3X12.25 (PERSONAL CARE ITEMS) ×3 IMPLANT
PENCIL SMOKE EVAC W/HOLSTER (ELECTROSURGICAL) ×3 IMPLANT
RTRCTR C-SECT PINK 25CM LRG (MISCELLANEOUS) ×3 IMPLANT
STRIP CLOSURE SKIN 1/2X4 (GAUZE/BANDAGES/DRESSINGS) ×3 IMPLANT
SUT PLAIN 0 NONE (SUTURE) IMPLANT
SUT PLAIN 2 0 XLH (SUTURE) IMPLANT
SUT VIC AB 0 CT1 27 (SUTURE) ×6
SUT VIC AB 0 CT1 27XBRD ANBCTR (SUTURE) ×4 IMPLANT
SUT VIC AB 0 CTX 36 (SUTURE) ×9
SUT VIC AB 0 CTX36XBRD ANBCTRL (SUTURE) ×6 IMPLANT
SUT VIC AB 2-0 CT1 27 (SUTURE) ×3
SUT VIC AB 2-0 CT1 TAPERPNT 27 (SUTURE) ×2 IMPLANT
SUT VIC AB 4-0 KS 27 (SUTURE) ×3 IMPLANT
TOWEL OR 17X24 6PK STRL BLUE (TOWEL DISPOSABLE) ×3 IMPLANT
TRAY FOLEY W/BAG SLVR 14FR LF (SET/KITS/TRAYS/PACK) IMPLANT
WATER STERILE IRR 1000ML POUR (IV SOLUTION) ×3 IMPLANT

## 2021-02-25 NOTE — Anesthesia Procedure Notes (Signed)
Procedure Name: Intubation Date/Time: 02/25/2021 11:30 AM Performed by: Rica Records, CRNA Pre-anesthesia Checklist: Patient identified, Emergency Drugs available, Suction available and Patient being monitored Patient Re-evaluated:Patient Re-evaluated prior to induction Oxygen Delivery Method: Circle system utilized Preoxygenation: Pre-oxygenation with 100% oxygen Induction Type: IV induction, Rapid sequence and Cricoid Pressure applied Laryngoscope Size: Glidescope Grade View: Grade II Tube type: Oral Tube size: 7.0 mm Number of attempts: 1 Airway Equipment and Method: Stylet and Video-laryngoscopy Placement Confirmation: ETT inserted through vocal cords under direct vision and positive ETCO2 Secured at: 21 cm Tube secured with: Tape Dental Injury: Teeth and Oropharynx as per pre-operative assessment

## 2021-02-25 NOTE — Progress Notes (Signed)
Pt refused inhalers   states she will take hers from home   will take neb if needed

## 2021-02-25 NOTE — Anesthesia Preprocedure Evaluation (Signed)
Anesthesia Evaluation  Patient identified by MRN, date of birth, ID band Patient awake  Preop documentation limited or incomplete due to emergent nature of procedure.  Airway Mallampati: II  TM Distance: >3 FB     Dental  (+) Dental Advisory Given   Pulmonary asthma , Current Smoker,    breath sounds clear to auscultation       Cardiovascular negative cardio ROS   Rhythm:Regular Rate:Normal     Neuro/Psych negative neurological ROS     GI/Hepatic Neg liver ROS, GERD  ,  Endo/Other  negative endocrine ROS  Renal/GU negative Renal ROS     Musculoskeletal   Abdominal   Peds  Hematology negative hematology ROS (+)   Anesthesia Other Findings   Reproductive/Obstetrics (+) Pregnancy                             Anesthesia Physical Anesthesia Plan  ASA: 3 and emergent  Anesthesia Plan: General   Post-op Pain Management:    Induction: Intravenous, Rapid sequence and Cricoid pressure planned  PONV Risk Score and Plan: 2 and Dexamethasone, Ondansetron and Treatment may vary due to age or medical condition  Airway Management Planned: Oral ETT  Additional Equipment:   Intra-op Plan:   Post-operative Plan: Extubation in OR  Informed Consent: I have reviewed the patients History and Physical, chart, labs and discussed the procedure including the risks, benefits and alternatives for the proposed anesthesia with the patient or authorized representative who has indicated his/her understanding and acceptance.     Dental advisory given  Plan Discussed with: CRNA  Anesthesia Plan Comments: (Fetal bradycardia in MAU for stat c-section under GETA.)        Anesthesia Quick Evaluation

## 2021-02-25 NOTE — Transfer of Care (Signed)
Immediate Anesthesia Transfer of Care Note  Patient: [name]  Procedure(s) Performed: CESAREAN SECTION BILATERAL TUBAL LIGATION (Bilateral)  Patient Location: PACU  Anesthesia Type:General  Level of Consciousness: awake, alert  and oriented  Airway & Oxygen Therapy: Patient Spontanous Breathing and Patient connected to nasal cannula oxygen  Post-op Assessment: Report given to RN and Post -op Vital signs reviewed and stable  Post vital signs: Reviewed and stable  Last Vitals:  Vitals Value Taken Time  BP 145/101 02/25/21 1231  Temp    Pulse 97 02/25/21 1235  Resp 19 02/25/21 1235  SpO2 100 % 02/25/21 1235  Vitals shown include unvalidated device data.  Last Pain:  Vitals:   02/25/21 0937  TempSrc: Oral  PainSc:          Complications: No notable events documented.

## 2021-02-25 NOTE — MAU Provider Note (Signed)
S: Ann Brown is a 35 y.o. [phone] at [redacted]w[redacted]d  who presents to MAU today complaining contractions. She denies vaginal bleeding. She denies LOF. She reports normal fetal movement.  Patient reports that baby was breech on one [place], but the next one that she had most recently was vertex. She was 2cm in the office at last check. RN is asking for me to check behind due to uncertainty regarding presentation.   O: BP 120/78   Pulse (!) 110   Temp 98.1 F (36.7 C) (Oral)   Resp 19   Ht 5' (1.524 m)   Wt 71.4 kg   LMP 05/24/2020 (Exact Date)   SpO2 100%   BMI 30.76 kg/m  GENERAL: Well-developed, well-nourished female in no acute distress.  HEAD: Normocephalic, atraumatic.  CHEST: Normal effort of breathing, regular heart rate ABDOMEN: Soft, nontender, gravid  Cervical exam:  Dilation: 3 Effacement (%): 50 Cervical Position: Middle Station: Ballotable Exam by:: E,.Edwards,RN   Pt informed that the ultrasound is considered a limited OB ultrasound and is not intended to be a complete ultrasound exam.  Patient also informed that the ultrasound is not being completed with the intent of assessing for fetal or placental anomalies or any pelvic abnormalities.  Explained that the purpose of today's ultrasound is to assess for  presentation.  Patient acknowledges the purpose of the exam and the limitations of the study.   VERTEX  Fetal Monitoring: Baseline: 125 Variability: moderate Accelerations: 15x15 Decelerations: none Contractions: q 3-5 mins   At 11:15am RN called MAU providers to the room due to FHR deceleration. Patient turned to right side, FHR did not recover. Patient turned to left side, FHR did not recover. Cervical exam done: 5/90/-1/BBOW. Dr. Donavan Foil notified. Patient put on hands and knees, FHR did not recover. Dr. Donavan Foil called for STAT c-section. Teams notified and patient left the unit with FHR 80s-90s.   A: SIUP at [redacted]w[redacted]d  Active labor Prolonged FHR deceleration remote from  delivery  Stat C-section   P: STAT C-section    Thressa Sheller DNP, [name]  02/25/21  11:40 AM

## 2021-02-25 NOTE — Op Note (Signed)
[name] PROCEDURE DATE: 02/25/2021  PREOPERATIVE DIAGNOSES: Intrauterine pregnancy at [redacted]w[redacted]d weeks gestation; non-reassuring fetal status, fetal bradycardia  POSTOPERATIVE DIAGNOSES: The same  PROCEDURE: Primary Low Transverse Cesarean Section  SURGEON:  Dr. Mariel Aloe  ASSISTANT:  Dr. Myna Hidalgo  ANESTHESIOLOGY TEAM: Anesthesiologist: Marcene Duos, MD CRNA: Rhymer, Doree Fudge, CRNA; Rica Records, CRNA  INDICATIONS: Ann Brown is a 35 y.o. [phone] at [redacted]w[redacted]d here for cesarean section secondary to the indications listed under preoperative diagnoses; please see preoperative note for further details.  The risks of surgery were discussed with the patient including but were not limited to: bleeding which may require transfusion or reoperation; infection which may require antibiotics; injury to bowel, bladder, ureters or other surrounding organs; injury to the fetus; need for additional procedures including hysterectomy in the event of a life-threatening hemorrhage; formation of adhesions; placental abnormalities wth subsequent pregnancies; incisional problems; thromboembolic phenomenon and other postoperative/anesthesia complications.  The patient concurred with the proposed plan, giving informed written consent for the procedure.    FINDINGS:  Viable female infant in cephalic presentation.  Apgars 8 and 9.  Clear amniotic fluid.  Intact placenta, three vessel cord.  Normal uterus, fallopian tubes and ovaries bilaterally.  ANESTHESIA: general INTRAVENOUS FLUIDS: 1000 ml   ESTIMATED BLOOD LOSS: 600 ml URINE OUTPUT:  100 ml SPECIMENS: Placenta sent to pathology COMPLICATIONS: None immediate  PROCEDURE IN DETAIL:  The patient preoperatively received intravenous antibiotics and had sequential compression devices applied to her lower extremities.  She was then taken to the operating room where general anesthesia was administered.  Pt had previously undergone splash prep  with betadine.    A foley catheter was placed into her bladder and attached to constant gravity prior to anesthesia induction.  After an adequate timeout was performed, a Pfannenstiel skin incision was made with scalpel two fingerbreaths above the pubic symphysis and carried through to the underlying layer of fascia. The fascia was incised in the midline, and this incision was extended bilaterally using blunt traction.   A similar process was carried out on the inferior aspect of the fascial incision. The rectus muscles were separated in the midline and the peritoneum was entered bluntly. A richardson retractor and bladder blade was placed in the abdominal cavity.     Attention was turned to the lower uterine segment where a low transverse hysterotomy was made with a scalpel and extended bilaterally bluntly.  The infant was successfully delivered, the cord was clamped and cut within 30 seconds, and the infant was handed over to the awaiting neonatology team. Uterine massage was then administered, and the placenta delivered intact with a three-vessel cord. The uterus was then cleared of clots and debris using manual curettage.  The Alexis self-retaining retractor was introduced into the abdominal cavity.  The uterine incision was closed with 1-0 monocryl in a running locked fashion, and an imbricating layer was also placed with 1-0 monocryl.    [place] The patient's left fallopian tube was then identified, and the Babcock clamp was then used to grasp the tube approximately 3 cm from the cornual region. A 3 cm segment of the tube was then doubly ligated with free tie of plain gut suture, transected and excised. Good hemostasis was noted. The right fallopian tube was then identified, doubly ligated, and a 3 cm segment excised in a similar fashion allowing for bilateral tubal sterilization. Excellent hemostasis was noted.     The pelvis was cleared of all clot and debris with irrigation  and suction. Hemostasis was  confirmed on all surfaces.  The retractor was removed.  The peritoneum was closed with a 2-0 Vicryl running stitch. The fascia was then closed using 0 Vicryl in a running fashion.  The subcutaneous layer was irrigated, reapproximated with 2-0 plain gut interrupted stitches, and the skin was closed with a 4-0 Vicryl subcuticular stitch. The patient tolerated the procedure well. Sponge, instrument and needle counts were correct x 3.  She was taken to the recovery room in stable condition.    Mariel Aloe, MD, FACOG Obstetrician & Gynecologist, [facility], [facility]

## 2021-02-25 NOTE — Progress Notes (Signed)
1109am. Patient ambulating to bathroom with portable efm monitor.  1114 patient returned back to stretcher. Efm monitors repositioned. Upon repositioning rn assessed fetal heart deceleration of 85. Pulse ox rechecked and applied to patients finger correctly. Patient turned to Left lateral position. Fetal heart rate remained at 85. RN called for assistance at bedside. Accompanied by mau staff. Emergency call was activated. Providers in attendance at bedside. Patient repositioned to left lateral FHR did not recovery.  Cervical exam performed by provider.  Dr. Donavan Foil called to bedside. Code cesarean called. OR teams notified.

## 2021-02-25 NOTE — Anesthesia Postprocedure Evaluation (Signed)
Anesthesia Post Note  Patient: Ann Brown  Procedure(s) Performed: CESAREAN SECTION BILATERAL TUBAL LIGATION (Bilateral)     Patient location during evaluation: PACU Anesthesia Type: General Level of consciousness: awake and alert Pain management: pain level controlled Vital Signs Assessment: post-procedure vital signs reviewed and stable Respiratory status: spontaneous breathing, nonlabored ventilation, respiratory function stable and patient connected to nasal cannula oxygen Cardiovascular status: blood pressure returned to baseline and stable Postop Assessment: no apparent nausea or vomiting Anesthetic complications: no   No notable events documented.  Last Vitals:  Vitals:   02/25/21 1430 02/25/21 1445  BP: 125/82 135/86  Pulse: 87 87  Resp: 15 17  Temp:    SpO2: 97% 96%    Last Pain:  Vitals:   02/25/21 1430  TempSrc:   PainSc: 7    Pain Goal:                   [name]

## 2021-02-25 NOTE — H&P (Signed)
OBSTETRIC ADMISSION HISTORY AND PHYSICAL Late entry s/p emergent cesarean section  Ann Brown is a 35 y.o. female [phone] with IUP at 56w4dby LMP presenting for labor check. She reports +Fms.  During her evaluation she was noted to be 5 cm dilated, but then had an 8 minute deceleration.  Pt was placed on hands and knees with some recovery to the 80s-90s.  Decision was made for stat cesarean section.     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   GERD (gastroesophageal reflux disease)    History of gestational diabetes 01/21/2013   Kidney problem    Kideney reflux   Suicidal ideation    Tobacco use    URI 03/01/2009    Past Surgical History: Past Surgical History:  Procedure Laterality Date   BOWEL RESECTION     CHOLECYSTECTOMY     Complex trunk closure  2005   CSF SHUNT  1987   TRACHEOSTOMY  1987    Obstetrical History: OB History     Gravida  4   Para  3   Term  3   Preterm  0   AB  1   Living  3      SAB  0   IAB  0   Ectopic  0   Multiple  0   Live Births  3           Social History Social History   Socioeconomic History   Marital status: Single    Spouse name: Not on file   Number of children: Not on file   Years of education: Not on file   Highest education level: Not on file  Occupational History   Not on file  Tobacco Use   Smoking status: Every Day    Packs/day: 1.00    Types: Cigarettes, E-cigarettes   Smokeless tobacco: Never   Tobacco comments:    Also uses ecigarette  Vaping Use   Vaping Use: Never used  Substance and Sexual Activity    Alcohol use: No    Alcohol/week: 0.0 standard drinks   Drug use: No   Sexual activity: Yes    Birth control/protection: None  Other Topics Concern   Not on file  Social History Narrative   ** Merged History Encounter **       Social Determinants of Health   Financial Resource Strain: Not on file  Food Insecurity: No Food Insecurity   Worried About RCharity fundraiserin the Last Year: Never true   RArboriculturistin the Last Year: Never true  Transportation Needs: No Transportation Needs   Lack of Transportation (Medical): No   Lack of Transportation (Non-Medical): No  Physical Activity: Not on file  Stress: Not on file  Social Connections: Not on file    Family History: Family History  Problem Relation Age of Onset   Multiple sclerosis Mother    Cervical cancer Mother    Breast cancer Maternal Grandmother        unsure of age    Allergies: Allergies  Allergen Reactions   Other    Ketorolac Other (See Comments)    Stomach pains     Medications Prior to Admission  Medication Sig Dispense Refill Last Dose   albuterol (PROVENTIL) (2.5 MG/3ML) 0.083% nebulizer solution Take 3 mLs (2.5 mg total) by nebulization every 6 (six) hours as needed for wheezing or shortness of breath. 75 mL 0 Past Week   amphetamine-dextroamphetamine (ADDERALL) 20 MG tablet Take 20 mg by mouth 2 (two) times daily.   02/24/2021   buPROPion (WELLBUTRIN SR) 100 MG 12 hr tablet take 1 tablet by oral route every morning   02/24/2021   busPIRone (BUSPAR) 5 MG tablet Take 5 mg by mouth 2 (two) times daily as needed.   02/24/2021   esomeprazole (NEXIUM) 40 MG capsule Take 1 capsule (40 mg total) by mouth 2 (two) times daily before a meal. 60 capsule 5 02/24/2021   fluticasone (FLONASE) 50 MCG/ACT nasal spray Place 1 spray into both nostrils daily. 11.1 mL 2 02/24/2021   hydrOXYzine (ATARAX/VISTARIL) 10 MG tablet Take 10 mg by mouth at bedtime as needed.   02/24/2021   Prenatal Vit-Fe Fumarate-FA (PREPLUS)  27-1 MG TABS Take 1 tablet by mouth daily. 30 tablet 8 02/24/2021   Accu-Chek Softclix Lancets lancets 1 each by Other route 4 (four) times daily. 100 each 12    albuterol (VENTOLIN HFA) 108 (90 Base) MCG/ACT inhaler Inhale 1-2 puffs into the lungs every 6 (six) hours as needed for wheezing or shortness of breath. 18 g 0    benzonatate (TESSALON) 100 MG capsule Take 1 capsule (100 mg total) by mouth every 8 (eight) hours. (Patient not taking: No sig reported) 30 capsule 1    Blood Glucose Monitoring Suppl (ACCU-CHEK NANO SMARTVIEW) w/Device KIT 1 kit by Subdermal route as directed. Check blood sugars for fasting, and two hours after breakfast, lunch and dinner (4 checks daily) 1 kit 0    Budeson-Glycopyrrol-Formoterol (BREZTRI AEROSPHERE) 160-9-4.8 MCG/ACT AERO Inhale 1-2 puffs into the lungs in the morning and at bedtime. 10.7 g 0    cetirizine (ZYRTEC) 10 MG tablet Take 1 tablet (10 mg total) by mouth daily. (Patient not taking: No sig reported) 30 tablet 0    glucose blood (ACCU-CHEK SMARTVIEW) test strip Use as instructed to check blood sugars 100 each 12    pseudoephedrine (SUDAFED) 30 MG tablet Take 1 tablet (30 mg total) by mouth every 4 (four) hours as needed for congestion. (Patient not taking: No sig reported) 30 tablet 0      Review of Systems   All systems reviewed and negative except as stated in HPI  Blood pressure (!) 132/92, pulse 90, temperature (!) 97.5 F (36.4 C), temperature source Oral, resp. rate 15, height 5' (1.524 m), weight 71.4 kg, last menstrual period 05/24/2020, SpO2 98 %, unknown if currently breastfeeding. Physical exam not performed due to emergent nature of the symptoms. Dilation: 5 Effacement (%): 90 Station: -1 Exam by:: H. Hogan,[name]   Prenatal labs: ABO, Rh: O/Positive/-- (04/14 1617) Antibody: Negative (04/14 1617) Rubella: 7.14 (04/14 1617) RPR: Non Reactive (08/23 0934)  HBsAg: Negative (04/14 1617)  HIV: Non Reactive (08/23 0934)  GBS:  Negative/-- (10/27 1435)   Prenatal Transfer Tool  Maternal Diabetes: Yes:  Diabetes Type:  Diet controlled Genetic Screening: Normal Maternal Ultrasounds/Referrals: Other:scan for fetal growth/EFW Fetal Ultrasounds or other Referrals:  None Maternal Substance Abuse:  No Significant Maternal Medications:  Meds include: Other: asthma meds Significant Maternal Lab Results: Group B Strep  negative  No results found for this or any previous visit (from the past 24 hour(s)).  Patient Active Problem List   Diagnosis Date Noted   Tooth pain 01/20/2021   GDM (gestational diabetes mellitus) 01/19/2021   Supervision of high risk pregnancy, antepartum 08/04/2020   AMA (advanced maternal age) multigravida 35+, third trimester 08/04/2020   Mass of mediastinum 06/30/2019   Abnormal mammogram 01/11/2017   Mild persistent asthma without complication 84/16/6063   Chronic lower back pain 09/24/2014   History of thyroid disease 02/24/2013   TOBACCO USER 04/25/2009    Assessment/Plan:  Ann Brown is a 35 y.o. [id number] at 86w4dhere for labor check and concurrent fetal bradycardia  Code section was called and pt was taken emergently to the OR for delivery. See operative note.  LGriffin Basil MD  02/25/2021, 2:13 PM

## 2021-02-25 NOTE — MAU Note (Signed)
Presents stating she's having ctx 5-6 minutes apart since 0500 this morning.  Denies VB or LOF.  Endorses +FM.

## 2021-02-26 LAB — CBC
HCT: 30.9 % — ABNORMAL LOW (ref 36.0–46.0)
Hemoglobin: 10.5 g/dL — ABNORMAL LOW (ref 12.0–15.0)
MCH: 34.5 pg — ABNORMAL HIGH (ref 26.0–34.0)
MCHC: 34 g/dL (ref 30.0–36.0)
MCV: 101.6 fL — ABNORMAL HIGH (ref 80.0–100.0)
Platelets: 221 10*3/uL (ref 150–400)
RBC: 3.04 MIL/uL — ABNORMAL LOW (ref 3.87–5.11)
RDW: 14 % (ref 11.5–15.5)
WBC: 17 10*3/uL — ABNORMAL HIGH (ref 4.0–10.5)
nRBC: 0 % (ref 0.0–0.2)

## 2021-02-26 LAB — CREATININE, SERUM
Creatinine, Ser: 0.45 mg/dL (ref 0.44–1.00)
GFR, Estimated: >60 mL/min (ref >60)

## 2021-02-26 LAB — GLUCOSE, CAPILLARY: Glucose-Capillary: 81 mg/dL (ref 70–99)

## 2021-02-26 LAB — RPR: RPR Ser Ql: NONREACTIVE

## 2021-02-26 MED ORDER — KETOROLAC TROMETHAMINE 30 MG/ML IJ SOLN
30.0000 mg | Freq: Four times a day (QID) | INTRAMUSCULAR | Status: AC
  Administered 2021-02-26 – 2021-02-27 (×4): 30 mg via INTRAVENOUS
  Filled 2021-02-26 (×4): qty 1

## 2021-02-26 NOTE — Progress Notes (Addendum)
Subjective: Postpartum Day #1  Cesarean Delivery Patient reports abdominal pain, has not been well controlled with tylenol and motrin. She has not had flatus or voided yet. Has tolerated PO.   Has PCA pump  Objective: Vital signs in last 24 hours: Temp:  [97.4 F (36.3 C)-98.1 F (36.7 C)] 98 F (36.7 C) (11/06 0619) Pulse Rate:  [79-110] 83 (11/06 0619) Resp:  [10-20] 14 (11/06 0619) BP: (109-150)/(65-102) 111/71 (11/06 0619) SpO2:  [96 %-100 %] 98 % (11/06 0619) Weight:  [71.4 kg] 71.4 kg (11/05 0932)  Physical Exam:  General: alert, cooperative, and no distress Lochia: appropriate Uterine Fundus: firm Incision: no significant drainage DVT Evaluation: No evidence of DVT seen on physical exam.  Recent Labs    02/26/21 0456  HGB 10.5*  HCT 30.9*    Assessment/Plan: Status post Cesarean section. Doing well postoperatively.  Continue current care. Patient has PCA pump but will give one dose of Oxycodone as patient reports no improvement in her pain overnight.  Plan to d/c PCA pump this morning and switch to toradol and consider gabapentin  Charlesetta Garibaldi [facility] 02/26/2021, 6:57 AM

## 2021-02-27 MED ORDER — IBUPROFEN 600 MG PO TABS: 600.0000 mg | ORAL_TABLET | Freq: Four times a day (QID) | ORAL | 0 refills | Status: AC | PRN

## 2021-02-27 MED ORDER — IBUPROFEN 600 MG PO TABS
600.0000 mg | ORAL_TABLET | Freq: Four times a day (QID) | ORAL | Status: DC
  Administered 2021-02-27: 600 mg via ORAL
  Filled 2021-02-27: qty 1

## 2021-02-27 MED ORDER — SIMETHICONE 80 MG PO CHEW: 80.0000 mg | CHEWABLE_TABLET | Freq: Three times a day (TID) | ORAL | 0 refills | Status: DC

## 2021-02-27 MED ORDER — ACETAMINOPHEN 325 MG PO TABS
650.0000 mg | ORAL_TABLET | Freq: Four times a day (QID) | ORAL | Status: DC
  Administered 2021-02-27 (×2): 650 mg via ORAL
  Filled 2021-02-27: qty 2

## 2021-02-27 MED ORDER — ACETAMINOPHEN 325 MG PO TABS: 650.0000 mg | ORAL_TABLET | Freq: Four times a day (QID) | ORAL | 0 refills | Status: AC | PRN

## 2021-02-27 MED ORDER — OXYCODONE HCL 5 MG PO TABS
5.0000 mg | ORAL_TABLET | ORAL | 0 refills | Status: DC | PRN
Start: 2021-02-27 — End: 2021-07-12

## 2021-02-27 MED ORDER — SENNOSIDES-DOCUSATE SODIUM 8.6-50 MG PO TABS
2.0000 | ORAL_TABLET | Freq: Every day | ORAL | 0 refills | Status: AC
Start: 2021-02-27 — End: 2021-03-29

## 2021-02-27 NOTE — Discharge Summary (Signed)
Postpartum Discharge Summary       Patient Name: Ann Brown DOB: 1986/01/04 MRN: [id number]  Date of admission: 02/25/2021 Delivery date:02/25/2021  Delivering provider: Griffin Basil  Date of discharge: 02/27/2021  Admitting diagnosis: S/P emergency cesarean section [Z98.891] Intrauterine pregnancy: [redacted]w[redacted]d    Secondary diagnosis:  Active Problems:   History of thyroid disease   Mild persistent asthma without complication   Abnormal mammogram   Supervision of high risk pregnancy, antepartum   AMA (advanced maternal age) multigravida 35+, third trimester   GDM (gestational diabetes mellitus)   S/P emergency cesarean section  Additional problems: none    Discharge diagnosis: Term Pregnancy Delivered, GDM A1, and Anemia                                              Post partum procedures:postpartum tubal ligation Augmentation: N/A Complications: None  Hospital course: Onset of Labor With Unplanned C/S   35y.o. yo [id number] [facility]. Patient had a labor course significant for prolonged deceleration in the MAU. The patient went for emergency cesarean section due to  prolonged fetal bradycardia . Delivery details as follows: Membrane Rupture Time/Date: 11:30 AM ,02/25/2021   Delivery Method:C-Section, Low Transverse  Details of operation can be found in separate operative note. Patient had an uncomplicated postpartum course.  She is ambulating,tolerating a regular diet, passing flatus, and urinating well.  Patient is discharged home in stable condition 02/27/21.  Newborn Data: Birth date:02/25/2021  Birth time:11:31 AM  Gender:Female  Living status:Living  Apgars:8 ,9  Weight:3580 g   Magnesium Sulfate received: No BMZ received: No Rhophylac:N/A MMR:N/A T-DaP:Given prenatally Flu: No Transfusion:No  Physical exam  Vitals:   02/26/21 1400 02/26/21 2136 02/26/21 2325 02/27/21 0609  BP: 108/72 129/89 112/80 110/70  Pulse:  88 (!) 111 91 (!) 107  Resp: _0 Temp: 98 F (36.7 C) 98.2 F (36.8 C)  98.3 F (36.8 C)  TempSrc:  Oral  Oral  SpO2:  100%  100%  Weight:      Height:       General: alert, cooperative, and no distress Lochia: appropriate Uterine Fundus: firm Incision: Healing well with no significant drainage, No significant erythema, Dressing is clean, dry, and intact DVT Evaluation: No evidence of DVT seen on physical exam. Negative Homan's sign. No cords or calf tenderness. No significant calf/ankle edema. Labs: Lab Results  Component Value Date   WBC 17.0 (H) 02/26/2021   HGB 10.5 (L) 02/26/2021   HCT 30.9 (L) 02/26/2021   MCV 101.6 (H) 02/26/2021   PLT 221 02/26/2021   CMP Latest Ref Rng & Units 02/26/2021  Glucose 70 - 99 mg/dL -  BUN 6 - 20 mg/dL -  Creatinine 0.44 - 1.00 mg/dL 0.45  Sodium 135 - 145 mmol/L -  Potassium 3.5 - 5.1 mmol/L -  Chloride 98 - 111 mmol/L -  CO2 22 - 32 mmol/L -  Calcium 8.9 - 10.3 mg/dL -  Total Protein 6.5 - 8.1 g/dL -  Total Bilirubin 0.3 - 1.2 mg/dL -  Alkaline Phos 38 - 126 U/L -  AST 15 - 41 U/L -  ALT 0 - 44 U/L -   Edinburgh Score: Edinburgh Postnatal Depression Scale Screening Tool 02/26/2021  I have been able to laugh and  see the funny side of things. 0  I have looked forward with enjoyment to things. 0  I have blamed myself unnecessarily when things went wrong. 1  I have been anxious or worried for no good reason. 0  I have felt scared or panicky for no good reason. 0  Things have been getting on top of me. 2  I have been so unhappy that I have had difficulty sleeping. 0  I have felt sad or miserable. 0  I have been so unhappy that I have been crying. 0  The thought of harming myself has occurred to me. 0  Edinburgh Postnatal Depression Scale Total 3     After visit meds:  Allergies as of 02/27/2021       Reactions   Other    Ketorolac Other (See Comments)   Stomach pains        Medication List     STOP taking  these medications    Accu-Chek Nano SmartView w/Device Kit   Accu-Chek SmartView test strip Generic drug: glucose blood   Accu-Chek Softclix Lancets lancets       TAKE these medications    acetaminophen 325 MG tablet Commonly known as: TYLENOL Take 2 tablets (650 mg total) by mouth every 6 (six) hours as needed for moderate pain.   albuterol (2.5 MG/3ML) 0.083% nebulizer solution Commonly known as: PROVENTIL Take 3 mLs (2.5 mg total) by nebulization every 6 (six) hours as needed for wheezing or shortness of breath.   albuterol 108 (90 Base) MCG/ACT inhaler Commonly known as: VENTOLIN HFA Inhale 1-2 puffs into the lungs every 6 (six) hours as needed for wheezing or shortness of breath.   amphetamine-dextroamphetamine 20 MG tablet Commonly known as: ADDERALL Take 20 mg by mouth 2 (two) times daily.   benzonatate 100 MG capsule Commonly known as: TESSALON Take 1 capsule (100 mg total) by mouth every 8 (eight) hours.   Breztri Aerosphere 160-9-4.8 MCG/ACT Aero Generic drug: Budeson-Glycopyrrol-Formoterol Inhale 1-2 puffs into the lungs in the morning and at bedtime.   buPROPion ER 100 MG 12 hr tablet Commonly known as: WELLBUTRIN SR take 1 tablet by oral route every morning   busPIRone 5 MG tablet Commonly known as: BUSPAR Take 5 mg by mouth 2 (two) times daily as needed.   cetirizine 10 MG tablet Commonly known as: ZYRTEC Take 1 tablet (10 mg total) by mouth daily.   esomeprazole 40 MG capsule Commonly known as: NexIUM Take 1 capsule (40 mg total) by mouth 2 (two) times daily before a meal.   fluticasone 50 MCG/ACT nasal spray Commonly known as: FLONASE Place 1 spray into both nostrils daily.   hydrOXYzine 10 MG tablet Commonly known as: ATARAX/VISTARIL Take 10 mg by mouth at bedtime as needed.   ibuprofen 600 MG tablet Commonly known as: ADVIL Take 1 tablet (600 mg total) by mouth every 6 (six) hours as needed for moderate pain or cramping.   oxyCODONE  5 MG immediate release tablet Commonly known as: Oxy IR/ROXICODONE Take 1 tablet (5 mg total) by mouth every 4 (four) hours as needed for up to 10 doses for moderate pain.   PrePLUS 27-1 MG Tabs Take 1 tablet by mouth daily.   pseudoephedrine 30 MG tablet Commonly known as: Sudafed Take 1 tablet (30 mg total) by mouth every 4 (four) hours as needed for congestion.   senna-docusate 8.6-50 MG tablet Commonly known as: Senokot-S Take 2 tablets by mouth daily.   simethicone 80 MG chewable  tablet Commonly known as: MYLICON Chew 1 tablet (80 mg total) by mouth 3 (three) times daily after meals.         Discharge home in stable condition Infant Feeding: Bottle Infant Disposition:home with mother Discharge instruction: per After Visit Summary and Postpartum booklet. Activity: Advance as tolerated. Pelvic rest for 6 weeks.  Diet: routine diet Future Appointments:No future appointments. Follow up Visit:  Message sent to [facility] by Dr. Cy Blamer on 11/7  Please schedule this patient for a In person postpartum visit in 4 weeks with the following provider: MD. Additional Postpartum F/U:2 hour GTT and Incision check 1 week   High risk pregnancy complicated by: GDM Delivery mode:  C-Section, Low Transverse  Anticipated Birth Control:  BTL done PP  Araceli Bouche, MD PGY1  GME ATTESTATION:  I saw and evaluated the patient. I agree with the findings and the plan of care as documented in the resident's note and have made all necessary changes.  Renard Matter, MD, MPH OB Fellow, [place] for [facility]

## 2021-02-27 NOTE — Social Work (Signed)
CSW received consult for hx of ADHD, SI and depression. CSW met with [name] to offer support and complete assessment.    CSW met with [name] at bedside and introduced CSW role. CSW observed [name] lying in bed and the nurse entered to take infant to circumcision procedure. [name] presented calm and welcomed CSW visit. CSW inquired how [name] has felt since giving birth. [name] reported she is still having pain from TBL procedure but overall doing well. CSW inquired about [name] mental health history. [name] disclosed she has history of anxiety, ADHD, PTSD, and depression. [name] reported she is being treated with Wellbutrin, Buspar and Adderall medication. [name] shared she has been seeing therapist Ann Brown at [facility] for the past 2 years. [name] reported she will continue medication and therapy treatment postpartum. [name] reported no history of PPD. CSW recommended [name] complete a self-evaluation during the postpartum time period using the New Mom Checklist from Postpartum Progress and encouraged [name] to contact a medical professional if symptoms are noted. [name] denied SI/HI/DV. CSW inquired about [name] supports. [name] identified FOB, her mom and grandma. [name] shared her son and daughter are looking forward to seeing the baby.   CSW provided review of Sudden Infant Death Syndrome (SIDS) precautions and reiterated not to use tobacco near the infant. [name] reported she smokes outdoors and will use precautions when handling the infant.  [name] reported she has essential items for the infant including a pack n play where the infant will sleep. [name] has is still deciding on a pediatrician. CSW assessed [name] for additional needs. [name] reported no further needs.   CSW identifies no further need for intervention and no barriers to discharge at this time.   Ann Brown, MSW, LCSW Women's and [place] Worker  [phone] 07/03/20  9:57 AM

## 2021-02-28 ENCOUNTER — Inpatient Hospital Stay (HOSPITAL_COMMUNITY): Payer: 59

## 2021-02-28 ENCOUNTER — Inpatient Hospital Stay (HOSPITAL_COMMUNITY): Admission: AD | Admit: 2021-02-28 | Payer: 59 | Source: Home / Self Care | Admitting: Family Medicine

## 2021-03-01 LAB — SURGICAL PATHOLOGY

## 2021-03-11 ENCOUNTER — Telehealth (HOSPITAL_COMMUNITY): Payer: Self-pay

## 2021-03-11 NOTE — Telephone Encounter (Signed)
No answer. Left message to return nurse call.  Marcelino Duster [facility] 03/11/2021,1504

## 2021-03-21 ENCOUNTER — Other Ambulatory Visit: Payer: Self-pay | Admitting: Family

## 2021-03-21 ENCOUNTER — Encounter: Payer: Self-pay | Admitting: Obstetrics and Gynecology

## 2021-03-22 ENCOUNTER — Other Ambulatory Visit: Payer: Self-pay | Admitting: Family

## 2021-03-23 NOTE — Telephone Encounter (Signed)
Patient with recent emergency C-section November 2022 and currently followed by OB/GYN. Patient should continue care with the same until medically released back to primary care.

## 2021-03-30 ENCOUNTER — Encounter: Payer: Self-pay | Admitting: Obstetrics & Gynecology

## 2021-04-03 ENCOUNTER — Ambulatory Visit (INDEPENDENT_AMBULATORY_CARE_PROVIDER_SITE_OTHER): Payer: 59 | Admitting: Obstetrics & Gynecology

## 2021-04-03 ENCOUNTER — Encounter: Payer: Self-pay | Admitting: Obstetrics & Gynecology

## 2021-04-03 ENCOUNTER — Other Ambulatory Visit: Payer: Self-pay

## 2021-04-03 VITALS — BP 117/80 | HR 94 | Ht 60.0 in | Wt 129.4 lb

## 2021-04-03 DIAGNOSIS — Z98891 History of uterine scar from previous surgery: Secondary | ICD-10-CM

## 2021-04-03 NOTE — Progress Notes (Signed)
    Post Partum Visit Note  Ann Brown is a 35 y.o. [phone] female who presents for a postpartum visit. She is 5 weeks postpartum following a primary cesarean section.  I have fully reviewed the prenatal and intrapartum course. The delivery was at 39/4 gestational weeks.  Anesthesia: general. Postpartum course has been complicated by mid-back pain. Baby is doing well . Baby is feeding by bottle - [employer] Good Start. Bleeding staining only. Bowel function is normal. Bladder function is normal. Patient is not sexually active. Contraception method is tubal ligation. Postpartum depression screening: negative.   The pregnancy intention screening data noted above was reviewed. Potential methods of contraception were discussed. The patient elected to proceed with No data recorded.    Health Maintenance Due  Topic Date Due   COVID-19 Vaccine (1) Never done   Pneumococcal Vaccine 34-72 Years old (2 - PCV) 07/02/2014   MAMMOGRAM  05/01/2018   INFLUENZA VACCINE  11/21/2020    The following portions of the patient's history were reviewed and updated as appropriate: allergies, current medications, past family history, past medical history, past social history, past surgical history, and problem list.  Review of Systems Musculoskeletal:positive for back pain  Objective:  LMP 05/24/2020 (Exact Date)    General:  alert, cooperative, and no distress   Breasts:  normal  Lungs: Effort normal  Heart:  regular rate and rhythm  Abdomen: soft, non-tender; bowel sounds normal; no masses,  no organomegaly   Wound well approximated incision  GU exam:  not indicated       Assessment:    There are no diagnoses linked to this encounter.  normal postpartum exam. MS back pain  Plan:   Essential components of care per ACOG recommendations:  1.  Mood and well being: Patient with negative depression screening today. Reviewed local resources for support.  - Patient tobacco use? Yes. Patient  desires to quit? No.   - hx of drug use? No.    2. Infant care and feeding:  -Patient currently breastmilk feeding? No.  -Social determinants of health (SDOH) reviewed in EPIC. No concerns 3. Sexuality, contraception and birth spacing - Patient does not want a pregnancy in the next year.  Desired family size is 3 children.  - Reviewed forms of contraception in tiered fashion. Patient desired bilateral tubal ligation today.   - Discussed birth spacing of 18 months  4. Sleep and fatigue -Encouraged family/partner/community support of 4 hrs of uninterrupted sleep to help with mood and fatigue  5. Physical Recovery  - Discussed patients delivery and complications. She describes her labor as good. - Patient had a C-section. - Patient has urinary incontinence? No. - Patient is safe to resume physical and sexual activity  6.  Health Maintenance - HM due items addressed Yes - Last pap smear  Diagnosis  Date Value Ref Range Status  08/04/2020   Final   - Negative for intraepithelial lesion or malignancy (NILM)   Pap smear not done at today's visit.  -Breast Cancer screening indicated? No.   7. Chronic Disease/Pregnancy Condition follow up:  back pain-chiropractic  - PCP follow up  Adam Phenix, MD  Center for [facility], [facility]

## 2021-04-26 ENCOUNTER — Encounter (HOSPITAL_COMMUNITY): Payer: Self-pay | Admitting: Obstetrics & Gynecology

## 2021-05-01 ENCOUNTER — Encounter: Payer: Self-pay | Admitting: Obstetrics & Gynecology

## 2021-05-08 ENCOUNTER — Other Ambulatory Visit: Payer: Self-pay

## 2021-05-08 ENCOUNTER — Ambulatory Visit: Payer: Medicaid Other | Admitting: *Deleted

## 2021-05-08 ENCOUNTER — Other Ambulatory Visit (HOSPITAL_COMMUNITY)
Admission: RE | Admit: 2021-05-08 | Discharge: 2021-05-08 | Disposition: A | Discharge status: Home / Self Care | Payer: Medicaid Other | Source: Ambulatory Visit | Attending: Family Medicine | Admitting: Family Medicine

## 2021-05-08 VITALS — BP 124/71 | HR 103 | Ht 60.0 in | Wt 132.1 lb

## 2021-05-08 DIAGNOSIS — N898 Other specified noninflammatory disorders of vagina: Secondary | ICD-10-CM | POA: Insufficient documentation

## 2021-05-08 NOTE — Progress Notes (Signed)
Reviewed patient chart. Agree with nurse assessment and plan.   Milas Hock, MD Attending Obstetrician & Gynecologist, [facility] for [facility], [facility]

## 2021-05-08 NOTE — Progress Notes (Signed)
Here for self swab. States since she had her baby has yellowish vaginal discharge. Denies itching or bad odor. Wants to test for BV and yeast. States has not had intercourse since delivery. Informed will be notified of results by phone or by Mychart if + and needs treatment. She voices understanding. Taiylor Virden,RN

## 2021-05-09 ENCOUNTER — Other Ambulatory Visit: Payer: Self-pay | Admitting: Obstetrics and Gynecology

## 2021-05-09 DIAGNOSIS — N898 Other specified noninflammatory disorders of vagina: Secondary | ICD-10-CM

## 2021-05-09 LAB — CERVICOVAGINAL ANCILLARY ONLY
Bacterial Vaginitis (gardnerella): POSITIVE — AB
Candida Glabrata: NEGATIVE
Candida Vaginitis: NEGATIVE
Comment: NEGATIVE
Comment: NEGATIVE
Comment: NEGATIVE

## 2021-05-09 MED ORDER — METRONIDAZOLE 500 MG PO TABS: 500.0000 mg | ORAL_TABLET | Freq: Two times a day (BID) | ORAL | 0 refills | Status: DC

## 2021-06-12 ENCOUNTER — Encounter: Payer: Self-pay | Admitting: Obstetrics & Gynecology

## 2021-06-20 ENCOUNTER — Telehealth: Payer: Medicaid Other | Admitting: Nurse Practitioner

## 2021-06-20 DIAGNOSIS — K0889 Other specified disorders of teeth and supporting structures: Secondary | ICD-10-CM | POA: Diagnosis not present

## 2021-06-20 MED ORDER — AMOXICILLIN 500 MG PO CAPS: 500.0000 mg | ORAL_CAPSULE | Freq: Three times a day (TID) | ORAL | 0 refills | Status: AC

## 2021-06-20 MED ORDER — IBUPROFEN 600 MG PO TABS: 600.0000 mg | ORAL_TABLET | Freq: Four times a day (QID) | ORAL | 0 refills | Status: DC | PRN

## 2021-06-20 NOTE — Progress Notes (Signed)
Virtual Visit Consent   [name], you are scheduled for a virtual visit with a  provider today.     Just as with appointments in the office, your consent must be obtained to participate.  Your consent will be active for this visit and any virtual visit you may have with one of our providers in the next 365 days.     If you have a MyChart account, a copy of this consent can be sent to you electronically.  All virtual visits are billed to your insurance company just like a traditional visit in the office.    As this is a virtual visit, video technology does not allow for your provider to perform a traditional examination.  This may limit your provider's ability to fully assess your condition.  If your provider identifies any concerns that need to be evaluated in person or the need to arrange testing (such as labs, EKG, etc.), we will make arrangements to do so.     Although advances in technology are sophisticated, we cannot ensure that it will always work on either your end or our end.  If the connection with a video visit is poor, the visit may have to be switched to a telephone visit.  With either a video or telephone visit, we are not always able to ensure that we have a secure connection.     I need to obtain your verbal consent now.   Are you willing to proceed with your visit today?    Ann Brown has provided verbal consent on 06/20/2021 for a virtual visit (video or telephone).   Viviano Simas, FNP   Date: 06/20/2021 5:43 PM   Virtual Visit via Video Note   I, Viviano Simas, connected with  Ann Brown  ([id number], February 08, 1986) on 06/20/21 at  5:45 PM EST by a video-enabled telemedicine application and verified that I am speaking with the correct person using two identifiers.  Location: Patient: Virtual Visit Location Patient: Home Provider: Virtual Visit Location Provider: Home Office   I discussed the limitations of evaluation and management by  telemedicine and the availability of in person appointments. The patient expressed understanding and agreed to proceed.    History of Present Illness: Ann Brown is a 36 y.o. who identifies as a female who was assigned female at birth, and is being seen today with complaints of a broken tooth that occurred two days ago. She now feels that her glands are swollen underneath and that her jaw is hurting.   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   Abnormal mammogram 01/11/2017   Mild persistent asthma without complication 08/17/2016   Chronic lower back pain 09/24/2014   History of thyroid disease 02/24/2013   TOBACCO USER 04/25/2009    Allergies:  Allergies  Allergen Reactions   Other    Ketorolac Other (See Comments)    Stomach pains    Medications:  Current Outpatient Medications:    acetaminophen (TYLENOL) 325 MG tablet, Take 2 tablets (650 mg total) by mouth every 6 (six) hours as needed for moderate pain., Disp: 60 tablet, Rfl: 0   albuterol (PROVENTIL) (2.5 MG/3ML) 0.083% nebulizer solution, Take 3 mLs (2.5 mg total) by nebulization every 6 (six) hours as needed for wheezing or shortness of breath., Disp: 75  mL, Rfl: 0   albuterol (VENTOLIN HFA) 108 (90 Base) MCG/ACT inhaler, Inhale 1-2 puffs into the lungs every 6 (six) hours as needed for wheezing or shortness of breath., Disp: 18 g, Rfl: 0   amphetamine-dextroamphetamine (ADDERALL) 20 MG tablet, Take 20 mg by mouth 2 (two) times daily., Disp: , Rfl:    benzonatate (TESSALON) 100 MG capsule, Take 1 capsule (100 mg total) by mouth every 8 (eight) hours. (Patient not taking: Reported on 02/16/2021), Disp: 30 capsule, Rfl: 1   Budeson-Glycopyrrol-Formoterol (BREZTRI AEROSPHERE) 160-9-4.8 MCG/ACT AERO, Inhale 1-2 puffs into the lungs in the morning and at  bedtime., Disp: 10.7 g, Rfl: 0   buPROPion (WELLBUTRIN SR) 100 MG 12 hr tablet, take 1 tablet by oral route every morning, Disp: , Rfl:    busPIRone (BUSPAR) 5 MG tablet, Take 5 mg by mouth 2 (two) times daily as needed., Disp: , Rfl:    cetirizine (ZYRTEC) 10 MG tablet, Take 1 tablet (10 mg total) by mouth daily., Disp: 30 tablet, Rfl: 0   esomeprazole (NEXIUM) 40 MG capsule, Take 1 capsule (40 mg total) by mouth 2 (two) times daily before a meal., Disp: 60 capsule, Rfl: 5   fluticasone (FLONASE) 50 MCG/ACT nasal spray, Place 1 spray into both nostrils daily., Disp: 11.1 mL, Rfl: 2   hydrOXYzine (ATARAX/VISTARIL) 10 MG tablet, Take 10 mg by mouth at bedtime as needed., Disp: , Rfl:    metroNIDAZOLE (FLAGYL) 500 MG tablet, Take 1 tablet (500 mg total) by mouth 2 (two) times daily., Disp: 14 tablet, Rfl: 0   oxyCODONE (OXY IR/ROXICODONE) 5 MG immediate release tablet, Take 1 tablet (5 mg total) by mouth every 4 (four) hours as needed for up to 10 doses for moderate pain., Disp: 10 tablet, Rfl: 0   Prenatal Vit-Fe Fumarate-FA (PREPLUS) 27-1 MG TABS, Take 1 tablet by mouth daily., Disp: 30 tablet, Rfl: 8   pseudoephedrine (SUDAFED) 30 MG tablet, Take 1 tablet (30 mg total) by mouth every 4 (four) hours as needed for congestion. (Patient not taking: Reported on 02/16/2021), Disp: 30 tablet, Rfl: 0   simethicone (MYLICON) 80 MG chewable tablet, Chew 1 tablet (80 mg total) by mouth 3 (three) times daily after meals. (Patient not taking: Reported on 05/08/2021), Disp: 30 tablet, Rfl: 0  Observations/Objective: Patient is well-developed, well-nourished in no acute distress.  Resting comfortably at home.  Head is normocephalic, atraumatic.  No labored breathing.  Speech is clear and coherent with logical content.  Patient is alert and oriented at baseline.    Assessment and Plan: 1. Pain, dental  - amoxicillin (AMOXIL) 500 MG capsule; Take 1 capsule (500 mg total) by mouth 3 (three) times daily for 10  days.  Dispense: 30 capsule; Refill: 0 - ibuprofen (ADVIL) 600 MG tablet; Take 1 tablet (600 mg total) by mouth every 6 (six) hours as needed for moderate pain. Take with food  Dispense: 30 tablet; Refill: 0    Schedule with a dentist, advised patient to call the number on her Medicaid card to find a provider in her network Follow Up Instructions: I discussed the assessment and treatment plan with the patient. The patient was provided an opportunity to ask questions and all were answered. The patient agreed with the plan and demonstrated an understanding of the instructions.  A copy of instructions were sent to the patient via MyChart unless otherwise noted below.   The patient was advised to call back or seek an in-person evaluation if the symptoms worsen  or if the condition fails to improve as anticipated.  Time:  I spent 10 minutes with the patient via telehealth technology discussing the above problems/concerns.    Viviano Simas, FNP

## 2021-07-12 ENCOUNTER — Telehealth: Payer: Medicaid Other | Admitting: Family

## 2021-07-12 DIAGNOSIS — H1031 Unspecified acute conjunctivitis, right eye: Secondary | ICD-10-CM

## 2021-07-12 MED ORDER — POLYMYXIN B-TRIMETHOPRIM 10000-0.1 UNIT/ML-% OP SOLN: 1.0000 [drp] | Freq: Four times a day (QID) | OPHTHALMIC | 0 refills | Status: AC

## 2021-07-12 NOTE — Progress Notes (Signed)
?Virtual Visit Consent  ? ?Ann Brown, you are scheduled for a virtual visit with a [facility] provider today.   ?  ?Just as with appointments in the office, your consent must be obtained to participate.  Your consent will be active for this visit and any virtual visit you may have with one of our providers in the next 365 days.   ?  ?If you have a MyChart account, a copy of this consent can be sent to you electronically.  All virtual visits are billed to your insurance company just like a traditional visit in the office.   ? ?As this is a virtual visit, video technology does not allow for your provider to perform a traditional examination.  This may limit your provider's ability to fully assess your condition.  If your provider identifies any concerns that need to be evaluated in person or the need to arrange testing (such as labs, EKG, etc.), we will make arrangements to do so.   ?  ?Although advances in technology are sophisticated, we cannot ensure that it will always work on either your end or our end.  If the connection with a video visit is poor, the visit may have to be switched to a telephone visit.  With either a video or telephone visit, we are not always able to ensure that we have a secure connection.    ? ?I need to obtain your verbal consent now.   Are you willing to proceed with your visit today?  ?  ?[facility] has provided verbal consent on 07/12/2021 for a virtual visit (video or telephone). ?  ?Evelina Dun, FNP  ? ?Date: 07/12/2021 1:14 PM ? ? ?Virtual Visit via Video Note  ? ?IEvelina Dun, connected with  Ann Brown  ([id number], [birth year]) on 07/12/21 at  1:15 PM EDT by a video-enabled telemedicine application and verified that I am speaking with the correct person using two identifiers. ? ?Location: ?Patient: Virtual Visit Location Patient: Home ?Provider: Virtual Visit Location Provider: Home Office ?  ?I discussed the limitations of evaluation and management by  telemedicine and the availability of in person appointments. The patient expressed understanding and agreed to proceed.   ? ?History of Present Illness: ?Ann Brown is a 36 y.o. who identifies as a female who was assigned female at birth, and is being seen today for redness of her right eye that started yesterday. She reports she has had URI symptoms since the 03/06/233.  ? ?HPI: Conjunctivitis  ?The current episode started yesterday. The onset was sudden. The problem occurs continuously. The problem has been gradually worsening. The problem is moderate. Associated symptoms include decreased vision, eye itching, eye discharge and eye redness. Pertinent negatives include no double vision, no photophobia, no congestion, no ear discharge, no ear pain, no headaches, no hearing loss and no eye pain.   ?Problems:  ?Patient Active Problem List  ? Diagnosis Date Noted  ? S/P emergency cesarean section 02/25/2021  ? Tooth pain 01/20/2021  ? Mass of mediastinum 06/30/2019  ? Abnormal mammogram 01/11/2017  ? Mild persistent asthma without complication [id number]  ? Chronic lower back pain 09/24/2014  ? History of thyroid disease 02/24/2013  ? TOBACCO USER 04/25/2009  ?  ?Allergies:  ?Allergies  ?Allergen Reactions  ? Other   ? Ketorolac Other (See Comments)  ?  Stomach pains ?  ? ?Medications:  ?Current Outpatient Medications:  ?  trimethoprim-polymyxin b (POLYTRIM) ophthalmic solution, Place 1 drop into  the right eye every 6 (six) hours., Disp: 10 mL, Rfl: 0 ?  acetaminophen (TYLENOL) 325 MG tablet, Take 2 tablets (650 mg total) by mouth every 6 (six) hours as needed for moderate pain., Disp: 60 tablet, Rfl: 0 ?  albuterol (PROVENTIL) (2.5 MG/3ML) 0.083% nebulizer solution, Take 3 mLs (2.5 mg total) by nebulization every 6 (six) hours as needed for wheezing or shortness of breath., Disp: 75 mL, Rfl: 0 ?  albuterol (VENTOLIN HFA) 108 (90 Base) MCG/ACT inhaler, Inhale 1-2 puffs into the lungs every 6 (six) hours as  needed for wheezing or shortness of breath., Disp: 18 g, Rfl: 0 ?  amphetamine-dextroamphetamine (ADDERALL) 20 MG tablet, Take 20 mg by mouth 2 (two) times daily., Disp: , Rfl:  ?  Budeson-Glycopyrrol-Formoterol (BREZTRI AEROSPHERE) 160-9-4.8 MCG/ACT AERO, Inhale 1-2 puffs into the lungs in the morning and at bedtime., Disp: 10.7 g, Rfl: 0 ?  buPROPion (WELLBUTRIN SR) 100 MG 12 hr tablet, take 1 tablet by oral route every morning, Disp: , Rfl:  ?  busPIRone (BUSPAR) 5 MG tablet, Take 5 mg by mouth 2 (two) times daily as needed., Disp: , Rfl:  ?  cetirizine (ZYRTEC) 10 MG tablet, Take 1 tablet (10 mg total) by mouth daily., Disp: 30 tablet, Rfl: 0 ?  esomeprazole (NEXIUM) 40 MG capsule, Take 1 capsule (40 mg total) by mouth 2 (two) times daily before a meal., Disp: 60 capsule, Rfl: 5 ?  fluticasone (FLONASE) 50 MCG/ACT nasal spray, Place 1 spray into both nostrils daily., Disp: 11.1 mL, Rfl: 2 ?  hydrOXYzine (ATARAX/VISTARIL) 10 MG tablet, Take 10 mg by mouth at bedtime as needed., Disp: , Rfl:  ?  ibuprofen (ADVIL) 600 MG tablet, Take 1 tablet (600 mg total) by mouth every 6 (six) hours as needed for moderate pain. Take with food, Disp: 30 tablet, Rfl: 0 ?  Prenatal Vit-Fe Fumarate-FA (PREPLUS) 27-1 MG TABS, Take 1 tablet by mouth daily., Disp: 30 tablet, Rfl: 8 ? ?Observations/Objective: ?Patient is well-developed, well-nourished in no acute distress.  ?Resting comfortably  at home.  ?Head is normocephalic, atraumatic.  ?No labored breathing.  ?Speech is clear and coherent with logical content.  ?Patient is alert and oriented at baseline.  ? ? ?Assessment and Plan: ?1. Acute conjunctivitis of right eye, unspecified acute conjunctivitis type ?- trimethoprim-polymyxin b (POLYTRIM) ophthalmic solution; Place 1 drop into the right eye every 6 (six) hours.  Dispense: 10 mL; Refill: 0 ? ?Good hand hygiene  ?Cool compresses  ?Avoid rubbing eye ?Start using drops if left eye develops symptoms  ? ?Follow Up Instructions: ?I  discussed the assessment and treatment plan with the patient. The patient was provided an opportunity to ask questions and all were answered. The patient agreed with the plan and demonstrated an understanding of the instructions.  A copy of instructions were sent to the patient via MyChart unless otherwise noted below.  ? ? ? ?The patient was advised to call back or seek an in-person evaluation if the symptoms worsen or if the condition fails to improve as anticipated. ? ?Time:  ?I spent 9 minutes with the patient via telehealth technology discussing the above problems/concerns.   ? ?Evelina Dun, FNP ? ?

## 2021-09-13 ENCOUNTER — Ambulatory Visit
Admission: RE | Admit: 2021-09-13 | Discharge: 2021-09-13 | Disposition: A | Discharge status: Home / Self Care | Payer: Medicaid Other | Source: Ambulatory Visit | Attending: Internal Medicine | Admitting: Internal Medicine

## 2021-09-13 VITALS — BP 140/87 | HR 77 | Temp 97.8°F | Resp 18 | Ht 60.0 in | Wt 130.0 lb

## 2021-09-13 DIAGNOSIS — T22211A Burn of second degree of right forearm, initial encounter: Secondary | ICD-10-CM

## 2021-09-13 DIAGNOSIS — Z23 Encounter for immunization: Secondary | ICD-10-CM

## 2021-09-13 MED ORDER — TETANUS-DIPHTH-ACELL PERTUSSIS 5-2.5-18.5 LF-MCG/0.5 IM SUSY
0.5000 mL | PREFILLED_SYRINGE | Freq: Once | INTRAMUSCULAR | Status: AC
  Administered 2021-09-13: 0.5 mL via INTRAMUSCULAR

## 2021-09-13 MED ORDER — SILVER SULFADIAZINE 1 % EX CREA: 1.0000 "application " | TOPICAL_CREAM | Freq: Every day | CUTANEOUS | 0 refills | Status: AC
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
## 2021-09-13 NOTE — Discharge Instructions (Addendum)
You have been prescribed an antibiotic cream to apply to burn.  It may be beneficial to keep burn covered to prevent infection.  Please follow-up if signs of infection occur that include increased redness, swelling, pus.

## 2021-11-13 ENCOUNTER — Telehealth: Payer: Medicaid Other | Admitting: Physician Assistant

## 2021-11-13 DIAGNOSIS — K047 Periapical abscess without sinus: Secondary | ICD-10-CM | POA: Diagnosis not present

## 2021-11-13 MED ORDER — IBUPROFEN 600 MG PO TABS: 600.0000 mg | ORAL_TABLET | Freq: Three times a day (TID) | ORAL | 0 refills | Status: AC | PRN

## 2021-11-13 MED ORDER — AMOXICILLIN 500 MG PO CAPS: 500.0000 mg | ORAL_CAPSULE | Freq: Three times a day (TID) | ORAL | 0 refills | Status: AC

## 2021-11-13 NOTE — Patient Instructions (Signed)
Ann Brown, thank you for joining Margaretann Loveless, PA-C for today's virtual visit.  While this provider is not your primary care provider (PCP), if your PCP is located in our provider database this encounter information will be shared with them immediately following your visit.  Consent: (Patient) Ann Brown provided verbal consent for this virtual visit at the beginning of the encounter.  Current Medications:  Current Outpatient Medications:    amoxicillin (AMOXIL) 500 MG capsule, Take 1 capsule (500 mg total) by mouth 3 (three) times daily for 10 days., Disp: 30 capsule, Rfl: 0   ibuprofen (ADVIL) 600 MG tablet, Take 1 tablet (600 mg total) by mouth every 8 (eight) hours as needed., Disp: 30 tablet, Rfl: 0   acetaminophen (TYLENOL) 325 MG tablet, Take 2 tablets (650 mg total) by mouth every 6 (six) hours as needed for moderate pain., Disp: 60 tablet, Rfl: 0   albuterol (PROVENTIL) (2.5 MG/3ML) 0.083% nebulizer solution, Take 3 mLs (2.5 mg total) by nebulization every 6 (six) hours as needed for wheezing or shortness of breath., Disp: 75 mL, Rfl: 0   albuterol (VENTOLIN HFA) 108 (90 Base) MCG/ACT inhaler, Inhale 1-2 puffs into the lungs every 6 (six) hours as needed for wheezing or shortness of breath., Disp: 18 g, Rfl: 0   amphetamine-dextroamphetamine (ADDERALL) 20 MG tablet, Take 20 mg by mouth 2 (two) times daily., Disp: , Rfl:    Budeson-Glycopyrrol-Formoterol (BREZTRI AEROSPHERE) 160-9-4.8 MCG/ACT AERO, Inhale 1-2 puffs into the lungs in the morning and at bedtime., Disp: 10.7 g, Rfl: 0   buPROPion (WELLBUTRIN SR) 100 MG 12 hr tablet, take 1 tablet by oral route every morning, Disp: , Rfl:    busPIRone (BUSPAR) 5 MG tablet, Take 5 mg by mouth 2 (two) times daily as needed., Disp: , Rfl:    cetirizine (ZYRTEC) 10 MG tablet, Take 1 tablet (10 mg total) by mouth daily., Disp: 30 tablet, Rfl: 0   esomeprazole (NEXIUM) 40 MG capsule, Take 1 capsule (40 mg total) by mouth 2  (two) times daily before a meal., Disp: 60 capsule, Rfl: 5   fluticasone (FLONASE) 50 MCG/ACT nasal spray, Place 1 spray into both nostrils daily., Disp: 11.1 mL, Rfl: 2   hydrOXYzine (ATARAX/VISTARIL) 10 MG tablet, Take 10 mg by mouth at bedtime as needed., Disp: , Rfl:    Prenatal Vit-Fe Fumarate-FA (PREPLUS) 27-1 MG TABS, Take 1 tablet by mouth daily., Disp: 30 tablet, Rfl: 8   silver sulfADIAZINE (SILVADENE) 1 % cream, Apply 1 application. topically daily., Disp: 50 g, Rfl: 0   trimethoprim-polymyxin b (POLYTRIM) ophthalmic solution, Place 1 drop into the right eye every 6 (six) hours., Disp: 10 mL, Rfl: 0   Medications ordered in this encounter:  Meds ordered this encounter  Medications   amoxicillin (AMOXIL) 500 MG capsule    Sig: Take 1 capsule (500 mg total) by mouth 3 (three) times daily for 10 days.    Dispense:  30 capsule    Refill:  0    Order Specific Question:   Supervising Provider    Answer:   MILLER, BRIAN [[id number]]   ibuprofen (ADVIL) 600 MG tablet    Sig: Take 1 tablet (600 mg total) by mouth every 8 (eight) hours as needed.    Dispense:  30 tablet    Refill:  0    Order Specific Question:   Supervising Provider    Answer:   Hyacinth Meeker, BRIAN [[id number]]     *If you need refills on  other medications prior to your next appointment, please contact your pharmacy*  Follow-Up: Call back or seek an in-person evaluation if the symptoms worsen or if the condition fails to improve as anticipated.  Other Instructions Dental Abscess  A dental abscess is an area of pus in or around a tooth. It comes from an infection. It can cause pain and other symptoms. Treatment will help with symptoms and prevent the infection from spreading. What are the causes? This condition is caused by an infection in or around the tooth. This can be from: Very bad tooth decay (cavities). A bad injury to the tooth, such as a broken or chipped tooth. What increases the risk? The risk to get an abscess is  higher in males. It is also more likely in people who: Have dental decay. Have very bad gum disease. Eat sugary snacks between meals. Use tobacco. Have diabetes. Have a weak disease-fighting system (immune system). Do not brush their teeth regularly. What are the signs or symptoms? Some mild symptoms are: Tenderness. Bad breath. Fever. A sharp, sour taste in the mouth. Pain in and around the infected tooth. Worse symptoms of this condition include: Swollen neck glands. Chills. Pus draining around the tooth. Swelling and redness around the tooth, the mouth, or the face. Very bad pain in and around the tooth. The worst symptoms can include: Difficulty swallowing. Difficulty opening your mouth. Feeling like you may vomit or vomiting. How is this treated? This is treated by getting rid of the infection. Your dentist will discuss ways to do this, including: Antibiotic medicines. Antibacterial mouth rinse. An incision in the abscess to drain out the pus. A root canal. Removing the tooth. Follow these instructions at home: Medicines Take over-the-counter and prescription medicines only as told by your dentist. If you were prescribed an antibiotic medicine, take it as told by your dentist. Do not stop taking it even if you start to feel better. If you were prescribed a gel that has numbing medicine in it, use it exactly as told. Ask your dentist if you should avoid driving or using machines while you are taking your medicine. General instructions Rinse your mouth often with salt water. To make salt water, dissolve -1 tsp (3-6 g) of salt in 1 cup (237 mL) of warm water. Eat a soft diet while your mouth is healing. Drink enough fluid to keep your pee (urine) pale yellow. Do not apply heat to the outside of your mouth. Do not smoke or use any products that contain nicotine or tobacco. If you need help quitting, ask your dentist. Keep all follow-up visits. Prevent an abscess Brush  your teeth every morning and every night. Use fluoride toothpaste. Floss your teeth each day. Get dental cleanings as often as told by your dentist. Think about getting dental sealant put on teeth that have deep holes (decay). Drink water that has fluoride in it. Most tap water has fluoride. Check the label on bottled water to see if it has fluoride in it. Drink water instead of sugary drinks. Eat healthy meals and snacks. Wear a mouth guard or face shield when you play sports. Contact a doctor if: Your pain is worse and medicine does not help. Get help right away if: You have a fever or chills. Your symptoms suddenly get worse. You have a very bad headache. You have problems breathing or swallowing. You have trouble opening your mouth. You have swelling in your neck or close to your eye. These symptoms may be  an emergency. Get help right away. Call your local emergency services (911 in the [place] Do not wait to see if the symptoms will go away. Do not drive yourself to the hospital. Summary A dental abscess is an area of pus in or around a tooth. It is caused by an infection. Treatment will help with symptoms and prevent the infection from spreading. Take over-the-counter and prescription medicines only as told by your dentist. To prevent an abscess, take good care of your teeth. Brush your teeth every morning and night. Use floss every day. Get dental cleanings as often as told by your dentist. This information is not intended to replace advice given to you by your health care provider. Make sure you discuss any questions you have with your health care provider. Document Revised: 06/16/2020 Document Reviewed: 06/16/2020 Elsevier Patient Education  2023 [employer]vier Inc.    If you have been instructed to have an in-person evaluation today at a local Urgent Care facility, please use the link below. It will take you to a list of all of our available [facility], including  address, phone number and hours of operation. Please do not delay care.  [facility]  If you or a family member do not have a primary care provider, use the link below to schedule a visit and establish care. When you choose a [facility] primary care physician or advanced practice provider, you gain a long-term partner in health. Find a Primary Care Provider  Learn more about [facility]'s in-office and virtual care options: [facility] - Get Care Now

## 2021-11-13 NOTE — Progress Notes (Signed)
Virtual Visit Consent   [name], you are scheduled for a virtual visit with a [facility] provider today. Just as with appointments in the office, your consent must be obtained to participate. Your consent will be active for this visit and any virtual visit you may have with one of our providers in the next 365 days. If you have a MyChart account, a copy of this consent can be sent to you electronically.  As this is a virtual visit, video technology does not allow for your provider to perform a traditional examination. This may limit your provider's ability to fully assess your condition. If your provider identifies any concerns that need to be evaluated in person or the need to arrange testing (such as labs, EKG, etc.), we will make arrangements to do so. Although advances in technology are sophisticated, we cannot ensure that it will always work on either your end or our end. If the connection with a video visit is poor, the visit may have to be switched to a telephone visit. With either a video or telephone visit, we are not always able to ensure that we have a secure connection.  By engaging in this virtual visit, you consent to the provision of healthcare and authorize for your insurance to be billed (if applicable) for the services provided during this visit. Depending on your insurance coverage, you may receive a charge related to this service.  I need to obtain your verbal consent now. Are you willing to proceed with your visit today? [name] has provided verbal consent on 11/13/2021 for a virtual visit (video or telephone). Margaretann Loveless, PA-C  Date: 11/13/2021 2:19 PM  Virtual Visit via Video Note   I, Margaretann Loveless, connected with  Ann Brown  ([id number], 06/20/1985) on 11/13/21 at  2:15 PM EDT by a video-enabled telemedicine application and verified that I am speaking with the correct person using two identifiers.  Location: Patient: Virtual Visit  Location Patient: Home Provider: Virtual Visit Location Provider: Home Office   I discussed the limitations of evaluation and management by telemedicine and the availability of in person appointments. The patient expressed understanding and agreed to proceed.    History of Present Illness: Ann Brown is a 36 y.o. who identifies as a female who was assigned female at birth, and is being seen today for dental pain/broken tooth.  HPI: Dental Pain  This is a recurrent problem. The current episode started in the past 7 days. The problem occurs constantly. The problem has been gradually worsening. The pain is moderate. Associated symptoms include facial pain, sinus pressure and thermal sensitivity. Pertinent negatives include no fever. She has tried acetaminophen and ice for the symptoms. The treatment provided no relief.      Problems:  Patient Active Problem List   Diagnosis Date Noted   S/P emergency cesarean section 02/25/2021   Tooth pain 01/20/2021   Mass of mediastinum 06/30/2019   Abnormal mammogram 01/11/2017   Mild persistent asthma without complication 08/17/2016   Chronic lower back pain 09/24/2014   History of thyroid disease 02/24/2013   TOBACCO USER 04/25/2009    Allergies:  Allergies  Allergen Reactions   Other    Ketorolac Other (See Comments)    Stomach pains    Medications:  Current Outpatient Medications:    amoxicillin (AMOXIL) 500 MG capsule, Take 1 capsule (500 mg total) by mouth 3 (three) times daily for 10 days., Disp: 30 capsule, Rfl: 0  ibuprofen (ADVIL) 600 MG tablet, Take 1 tablet (600 mg total) by mouth every 8 (eight) hours as needed., Disp: 30 tablet, Rfl: 0   acetaminophen (TYLENOL) 325 MG tablet, Take 2 tablets (650 mg total) by mouth every 6 (six) hours as needed for moderate pain., Disp: 60 tablet, Rfl: 0   albuterol (PROVENTIL) (2.5 MG/3ML) 0.083% nebulizer solution, Take 3 mLs (2.5 mg total) by nebulization every 6 (six) hours as needed  for wheezing or shortness of breath., Disp: 75 mL, Rfl: 0   albuterol (VENTOLIN HFA) 108 (90 Base) MCG/ACT inhaler, Inhale 1-2 puffs into the lungs every 6 (six) hours as needed for wheezing or shortness of breath., Disp: 18 g, Rfl: 0   amphetamine-dextroamphetamine (ADDERALL) 20 MG tablet, Take 20 mg by mouth 2 (two) times daily., Disp: , Rfl:    Budeson-Glycopyrrol-Formoterol (BREZTRI AEROSPHERE) 160-9-4.8 MCG/ACT AERO, Inhale 1-2 puffs into the lungs in the morning and at bedtime., Disp: 10.7 g, Rfl: 0   buPROPion (WELLBUTRIN SR) 100 MG 12 hr tablet, take 1 tablet by oral route every morning, Disp: , Rfl:    busPIRone (BUSPAR) 5 MG tablet, Take 5 mg by mouth 2 (two) times daily as needed., Disp: , Rfl:    cetirizine (ZYRTEC) 10 MG tablet, Take 1 tablet (10 mg total) by mouth daily., Disp: 30 tablet, Rfl: 0   esomeprazole (NEXIUM) 40 MG capsule, Take 1 capsule (40 mg total) by mouth 2 (two) times daily before a meal., Disp: 60 capsule, Rfl: 5   fluticasone (FLONASE) 50 MCG/ACT nasal spray, Place 1 spray into both nostrils daily., Disp: 11.1 mL, Rfl: 2   hydrOXYzine (ATARAX/VISTARIL) 10 MG tablet, Take 10 mg by mouth at bedtime as needed., Disp: , Rfl:    Prenatal Vit-Fe Fumarate-FA (PREPLUS) 27-1 MG TABS, Take 1 tablet by mouth daily., Disp: 30 tablet, Rfl: 8   silver sulfADIAZINE (SILVADENE) 1 % cream, Apply 1 application. topically daily., Disp: 50 g, Rfl: 0   trimethoprim-polymyxin b (POLYTRIM) ophthalmic solution, Place 1 drop into the right eye every 6 (six) hours., Disp: 10 mL, Rfl: 0  Observations/Objective: Patient is well-developed, well-nourished in no acute distress.  Resting comfortably at home.  Head is normocephalic, atraumatic.  No labored breathing.  Speech is clear and coherent with logical content.  Patient is alert and oriented at baseline.    Assessment and Plan: 1. Dental infection - amoxicillin (AMOXIL) 500 MG capsule; Take 1 capsule (500 mg total) by mouth 3 (three)  times daily for 10 days.  Dispense: 30 capsule; Refill: 0 - ibuprofen (ADVIL) 600 MG tablet; Take 1 tablet (600 mg total) by mouth every 8 (eight) hours as needed.  Dispense: 30 tablet; Refill: 0  - Suspected infection with broken tooth - Amoxicillin and ibuprofen prescribed - Can use ice on outside jaw/cheek for swelling - Can also take tylenol for pain with other medications - Discussed DenTemp putty that can be used to cover a broken tooth - Schedule a follow with a dentist as soon as possible (Can contact [facility] or [facility] [phone] associated with [facility] department if underinsured or uninsured) - Seek in person evaluation if symptoms fail to improve or if they worsen  Follow Up Instructions: I discussed the assessment and treatment plan with the patient. The patient was provided an opportunity to ask questions and all were answered. The patient agreed with the plan and demonstrated an understanding of the instructions.  A copy of instructions were sent  to the patient via MyChart unless otherwise noted below.   The patient was advised to call back or seek an in-person evaluation if the symptoms worsen or if the condition fails to improve as anticipated.  Time:  I spent 8 minutes with the patient via telehealth technology discussing the above problems/concerns.    Margaretann Loveless, PA-C

## 2021-12-17 ENCOUNTER — Telehealth: Payer: Medicaid Other | Admitting: Nurse Practitioner

## 2021-12-17 DIAGNOSIS — K047 Periapical abscess without sinus: Secondary | ICD-10-CM

## 2021-12-17 MED ORDER — PENICILLIN V POTASSIUM 500 MG PO TABS: 500.0000 mg | ORAL_TABLET | Freq: Four times a day (QID) | ORAL | 0 refills | Status: AC

## 2021-12-17 MED ORDER — IBUPROFEN 800 MG PO TABS: 800.0000 mg | ORAL_TABLET | Freq: Three times a day (TID) | ORAL | 0 refills | Status: AC | PRN

## 2021-12-17 NOTE — Patient Instructions (Addendum)
Ann Brown, thank you for joining Abran Cantor, NP for today's virtual visit.  While this provider is not your primary care provider (PCP), if your PCP is located in our provider database this encounter information will be shared with them immediately following your visit.  Consent: (Patient) Ann Brown provided verbal consent for this virtual visit at the beginning of the encounter.  Current Medications:  Current Outpatient Medications:    ibuprofen (ADVIL) 800 MG tablet, Take 1 tablet (800 mg total) by mouth every 8 (eight) hours as needed., Disp: 30 tablet, Rfl: 0   penicillin v potassium (VEETID) 500 MG tablet, Take 1 tablet (500 mg total) by mouth 4 (four) times daily for 7 days., Disp: 28 tablet, Rfl: 0   acetaminophen (TYLENOL) 325 MG tablet, Take 2 tablets (650 mg total) by mouth every 6 (six) hours as needed for moderate pain., Disp: 60 tablet, Rfl: 0   albuterol (PROVENTIL) (2.5 MG/3ML) 0.083% nebulizer solution, Take 3 mLs (2.5 mg total) by nebulization every 6 (six) hours as needed for wheezing or shortness of breath., Disp: 75 mL, Rfl: 0   albuterol (VENTOLIN HFA) 108 (90 Base) MCG/ACT inhaler, Inhale 1-2 puffs into the lungs every 6 (six) hours as needed for wheezing or shortness of breath., Disp: 18 g, Rfl: 0   amphetamine-dextroamphetamine (ADDERALL) 20 MG tablet, Take 20 mg by mouth 2 (two) times daily., Disp: , Rfl:    Budeson-Glycopyrrol-Formoterol (BREZTRI AEROSPHERE) 160-9-4.8 MCG/ACT AERO, Inhale 1-2 puffs into the lungs in the morning and at bedtime., Disp: 10.7 g, Rfl: 0   buPROPion (WELLBUTRIN SR) 100 MG 12 hr tablet, take 1 tablet by oral route every morning, Disp: , Rfl:    busPIRone (BUSPAR) 5 MG tablet, Take 5 mg by mouth 2 (two) times daily as needed., Disp: , Rfl:    cetirizine (ZYRTEC) 10 MG tablet, Take 1 tablet (10 mg total) by mouth daily., Disp: 30 tablet, Rfl: 0   esomeprazole (NEXIUM) 40 MG capsule, Take 1 capsule (40 mg total) by  mouth 2 (two) times daily before a meal., Disp: 60 capsule, Rfl: 5   fluticasone (FLONASE) 50 MCG/ACT nasal spray, Place 1 spray into both nostrils daily., Disp: 11.1 mL, Rfl: 2   hydrOXYzine (ATARAX/VISTARIL) 10 MG tablet, Take 10 mg by mouth at bedtime as needed., Disp: , Rfl:    ibuprofen (ADVIL) 600 MG tablet, Take 1 tablet (600 mg total) by mouth every 8 (eight) hours as needed., Disp: 30 tablet, Rfl: 0   Prenatal Vit-Fe Fumarate-FA (PREPLUS) 27-1 MG TABS, Take 1 tablet by mouth daily., Disp: 30 tablet, Rfl: 8   silver sulfADIAZINE (SILVADENE) 1 % cream, Apply 1 application. topically daily., Disp: 50 g, Rfl: 0   trimethoprim-polymyxin b (POLYTRIM) ophthalmic solution, Place 1 drop into the right eye every 6 (six) hours., Disp: 10 mL, Rfl: 0   Medications ordered in this encounter:  Meds ordered this encounter  Medications   penicillin v potassium (VEETID) 500 MG tablet    Sig: Take 1 tablet (500 mg total) by mouth 4 (four) times daily for 7 days.    Dispense:  28 tablet    Refill:  0   ibuprofen (ADVIL) 800 MG tablet    Sig: Take 1 tablet (800 mg total) by mouth every 8 (eight) hours as needed.    Dispense:  30 tablet    Refill:  0    Has been taking Ibuprofen without side effects     *If you need  refills on other medications prior to your next appointment, please contact your pharmacy*  Follow-Up: Call back or seek an in-person evaluation if the symptoms worsen or if the condition fails to improve as anticipated.  Other Instructions   Take medication as prescribed. Stop the Clindamycin you are currently taking.  May take over-the-counter Tylenol extra strength  500mg  or Tylenol Arthritis Strength 650 mg 1 to 2 hours after ibuprofen for breakthrough pain. Warm salt water gargles 3-4 times daily until symptoms improve. Continue warm compresses to the affected area to help with pain and discomfort. Go to the emergency room if you develop include increased facial swelling, fever,  chills, abdominal pain,, nausea, vomiting or other concerns Follow up with dentist on 12/18/21.    If you have been instructed to have an in-person evaluation today at a local Urgent Care facility, please use the link below. It will take you to a list of all of our available [facility], including address, phone number and hours of operation. Please do not delay care.  [facility]  If you or a family member do not have a primary care provider, use the link below to schedule a visit and establish care. When you choose a [facility] primary care physician or advanced practice provider, you gain a long-term partner in health. Find a Primary Care Provider  Learn more about [facility]'s in-office and virtual care options: [facility] - Get Care Now

## 2021-12-17 NOTE — Progress Notes (Signed)
Virtual Visit Consent   Ann Brown, you are scheduled for a virtual visit with a [facility] provider today. Just as with appointments in the office, your consent must be obtained to participate. Your consent will be active for this visit and any virtual visit you may have with one of our providers in the next 365 days. If you have a MyChart account, a copy of this consent can be sent to you electronically.  As this is a virtual visit, video technology does not allow for your provider to perform a traditional examination. This may limit your provider's ability to fully assess your condition. If your provider identifies any concerns that need to be evaluated in person or the need to arrange testing (such as labs, EKG, etc.), we will make arrangements to do so. Although advances in technology are sophisticated, we cannot ensure that it will always work on either your end or our end. If the connection with a video visit is poor, the visit may have to be switched to a telephone visit. With either a video or telephone visit, we are not always able to ensure that we have a secure connection.  By engaging in this virtual visit, you consent to the provision of healthcare and authorize for your insurance to be billed (if applicable) for the services provided during this visit. Depending on your insurance coverage, you may receive a charge related to this service.  I need to obtain your verbal consent now. Are you willing to proceed with your visit today? Ann Brown has provided verbal consent on 12/17/2021 for a virtual visit (video or telephone). Abran Cantor, NP  Date: 12/17/2021 12:50 PM  Virtual Visit via Video Note   I, Ann Brown, connected with  Ann Brown  ([id number], 1986/02/14) on 12/17/21 at 12:30 PM EDT by a video-enabled telemedicine application and verified that I am speaking with the correct person using two identifiers.  Location: Patient: Virtual  Visit Location Patient: Home Provider: Virtual Visit Location Provider: Home   I discussed the limitations of evaluation and management by telemedicine and the availability of in person appointments. The patient expressed understanding and agreed to proceed.    History of Present Illness: Ann Brown is a 36 y.o. who identifies as a female who was assigned female at birth, and is being seen today for dental pain. Patient reports she was prescribed Clindamycin on 12/11/21 but started having increased pain and swelling in the left jaw x 1 day. States her dentist won't remove the tooth until she can have a bone graft at the same time. History of broken tooth on the left side. Patient reports increased pain with talking and chewing and gum swelling. States she can also see red spot on her face where the swelling is. Denies fever, chills, chest pain, abdominal pain, nausea, vomiting or diarrhea. She has also been taking Ibuprofen every 8 hours, using warm compresses and warm saltwater gargles.   HPI: HPI  Problems:  Patient Active Problem List   Diagnosis Date Noted   S/P emergency cesarean section 02/25/2021   Tooth pain 01/20/2021   Mass of mediastinum 06/30/2019   Abnormal mammogram 01/11/2017   Mild persistent asthma without complication 08/17/2016   Chronic lower back pain 09/24/2014   History of thyroid disease 02/24/2013   TOBACCO USER 04/25/2009    Allergies:  Allergies  Allergen Reactions   Other    Ketorolac Other (See Comments)    Stomach pains  Medications:  Current Outpatient Medications:    ibuprofen (ADVIL) 800 MG tablet, Take 1 tablet (800 mg total) by mouth every 8 (eight) hours as needed., Disp: 30 tablet, Rfl: 0   penicillin v potassium (VEETID) 500 MG tablet, Take 1 tablet (500 mg total) by mouth 4 (four) times daily for 7 days., Disp: 28 tablet, Rfl: 0   acetaminophen (TYLENOL) 325 MG tablet, Take 2 tablets (650 mg total) by mouth every 6 (six) hours as needed  for moderate pain., Disp: 60 tablet, Rfl: 0   albuterol (PROVENTIL) (2.5 MG/3ML) 0.083% nebulizer solution, Take 3 mLs (2.5 mg total) by nebulization every 6 (six) hours as needed for wheezing or shortness of breath., Disp: 75 mL, Rfl: 0   albuterol (VENTOLIN HFA) 108 (90 Base) MCG/ACT inhaler, Inhale 1-2 puffs into the lungs every 6 (six) hours as needed for wheezing or shortness of breath., Disp: 18 g, Rfl: 0   amphetamine-dextroamphetamine (ADDERALL) 20 MG tablet, Take 20 mg by mouth 2 (two) times daily., Disp: , Rfl:    Budeson-Glycopyrrol-Formoterol (BREZTRI AEROSPHERE) 160-9-4.8 MCG/ACT AERO, Inhale 1-2 puffs into the lungs in the morning and at bedtime., Disp: 10.7 g, Rfl: 0   buPROPion (WELLBUTRIN SR) 100 MG 12 hr tablet, take 1 tablet by oral route every morning, Disp: , Rfl:    busPIRone (BUSPAR) 5 MG tablet, Take 5 mg by mouth 2 (two) times daily as needed., Disp: , Rfl:    cetirizine (ZYRTEC) 10 MG tablet, Take 1 tablet (10 mg total) by mouth daily., Disp: 30 tablet, Rfl: 0   esomeprazole (NEXIUM) 40 MG capsule, Take 1 capsule (40 mg total) by mouth 2 (two) times daily before a meal., Disp: 60 capsule, Rfl: 5   fluticasone (FLONASE) 50 MCG/ACT nasal spray, Place 1 spray into both nostrils daily., Disp: 11.1 mL, Rfl: 2   hydrOXYzine (ATARAX/VISTARIL) 10 MG tablet, Take 10 mg by mouth at bedtime as needed., Disp: , Rfl:    ibuprofen (ADVIL) 600 MG tablet, Take 1 tablet (600 mg total) by mouth every 8 (eight) hours as needed., Disp: 30 tablet, Rfl: 0   Prenatal Vit-Fe Fumarate-FA (PREPLUS) 27-1 MG TABS, Take 1 tablet by mouth daily., Disp: 30 tablet, Rfl: 8   silver sulfADIAZINE (SILVADENE) 1 % cream, Apply 1 application. topically daily., Disp: 50 g, Rfl: 0   trimethoprim-polymyxin b (POLYTRIM) ophthalmic solution, Place 1 drop into the right eye every 6 (six) hours., Disp: 10 mL, Rfl: 0  Observations/Objective: Patient is well-developed, well-nourished in no acute distress.  Resting  comfortably at home.  Head is normocephalic, atraumatic. Left sided facial swelling is present. No labored breathing.  Speech is clear and coherent with logical content.  Patient is alert and oriented at baseline.   Assessment and Plan: 1. Dental abscess - penicillin v potassium (VEETID) 500 MG tablet; Take 1 tablet (500 mg total) by mouth 4 (four) times daily for 7 days.  Dispense: 28 tablet; Refill: 0 - ibuprofen (ADVIL) 800 MG tablet; Take 1 tablet (800 mg total) by mouth every 8 (eight) hours as needed.  Dispense: 30 tablet; Refill: 0  Presents via virtual visit for facial swelling and dentalgia consistent with dental abscess. Will start patient on Penicillin v at this time. Patient advised to stop the Clindamycin. Support care recommendations were provided to the patient to include warm saltwater gargles, warm/cool compresses. Recommended taking Tylenol 500mg  or Tylenol 650mg  tablet 1-2 hours after Ibuprofen for breakthrough pain. Patient will need to contact dentist on 12/18/21. Strict  indications of when to go to the ER were provided to include increased facial swelling, fever, chills, abdominal pain,, nausea, vomiting or other concerns. Patient verbalizes understanding, all questions were answered.   Follow Up Instructions: I discussed the assessment and treatment plan with the patient. The patient was provided an opportunity to ask questions and all were answered. The patient agreed with the plan and demonstrated an understanding of the instructions.  A copy of instructions were sent to the patient via MyChart unless otherwise noted below.    The patient was advised to call back or seek an in-person evaluation if the symptoms worsen or if the condition fails to improve as anticipated.  Time:  I spent 10 minutes with the patient via telehealth technology discussing the above problems/concerns.    Abran Cantor, NP

## 2022-04-14 ENCOUNTER — Telehealth: Payer: Medicaid Other | Admitting: Urgent Care

## 2022-04-14 DIAGNOSIS — K047 Periapical abscess without sinus: Secondary | ICD-10-CM

## 2022-04-14 MED ORDER — DICLOFENAC SODIUM 75 MG PO TBEC: 75.0000 mg | DELAYED_RELEASE_TABLET | Freq: Two times a day (BID) | ORAL | 0 refills | Status: AC

## 2022-04-14 MED ORDER — CHLORHEXIDINE GLUCONATE 0.12 % MT SOLN: 10.0000 mL | Freq: Two times a day (BID) | OROMUCOSAL | 0 refills | Status: AC

## 2022-04-14 MED ORDER — AMOXICILLIN-POT CLAVULANATE 875-125 MG PO TABS: 1.0000 | ORAL_TABLET | Freq: Two times a day (BID) | ORAL | 0 refills | Status: DC

## 2022-04-14 NOTE — Patient Instructions (Signed)
Ann Brown, thank you for joining Maretta Bees, PA for today's virtual visit.  While this provider is not your primary care provider (PCP), if your PCP is located in our provider database this encounter information will be shared with them immediately following your visit.   A [facility] MyChart account gives you access to today's visit and all your visits, tests, and labs performed at [facility] " click here if you don't have a [facility] MyChart account or go to [email or web address]  Consent: (Patient) Ann Brown provided verbal consent for this virtual visit at the beginning of the encounter.  Current Medications:  Current Outpatient Medications:    amoxicillin-clavulanate (AUGMENTIN) 875-125 MG tablet, Take 1 tablet by mouth 2 (two) times daily., Disp: 20 tablet, Rfl: 0   chlorhexidine (PERIDEX) 0.12 % solution, Use as directed 10 mLs in the mouth or throat 2 (two) times daily., Disp: 120 mL, Rfl: 0   diclofenac (VOLTAREN) 75 MG EC tablet, Take 1 tablet (75 mg total) by mouth 2 (two) times daily with a meal for 7 days., Disp: 14 tablet, Rfl: 0   acetaminophen (TYLENOL) 325 MG tablet, Take 2 tablets (650 mg total) by mouth every 6 (six) hours as needed for moderate pain., Disp: 60 tablet, Rfl: 0   albuterol (PROVENTIL) (2.5 MG/3ML) 0.083% nebulizer solution, Take 3 mLs (2.5 mg total) by nebulization every 6 (six) hours as needed for wheezing or shortness of breath., Disp: 75 mL, Rfl: 0   albuterol (VENTOLIN HFA) 108 (90 Base) MCG/ACT inhaler, Inhale 1-2 puffs into the lungs every 6 (six) hours as needed for wheezing or shortness of breath., Disp: 18 g, Rfl: 0   amphetamine-dextroamphetamine (ADDERALL) 20 MG tablet, Take 20 mg by mouth 2 (two) times daily., Disp: , Rfl:    Budeson-Glycopyrrol-Formoterol (BREZTRI AEROSPHERE) 160-9-4.8 MCG/ACT AERO, Inhale 1-2 puffs into the lungs in the morning and at bedtime., Disp: 10.7 g, Rfl: 0   buPROPion (WELLBUTRIN SR)  100 MG 12 hr tablet, take 1 tablet by oral route every morning, Disp: , Rfl:    busPIRone (BUSPAR) 5 MG tablet, Take 5 mg by mouth 2 (two) times daily as needed., Disp: , Rfl:    cetirizine (ZYRTEC) 10 MG tablet, Take 1 tablet (10 mg total) by mouth daily., Disp: 30 tablet, Rfl: 0   esomeprazole (NEXIUM) 40 MG capsule, Take 1 capsule (40 mg total) by mouth 2 (two) times daily before a meal., Disp: 60 capsule, Rfl: 5   fluticasone (FLONASE) 50 MCG/ACT nasal spray, Place 1 spray into both nostrils daily., Disp: 11.1 mL, Rfl: 2   hydrOXYzine (ATARAX/VISTARIL) 10 MG tablet, Take 10 mg by mouth at bedtime as needed., Disp: , Rfl:    ibuprofen (ADVIL) 600 MG tablet, Take 1 tablet (600 mg total) by mouth every 8 (eight) hours as needed., Disp: 30 tablet, Rfl: 0   ibuprofen (ADVIL) 800 MG tablet, Take 1 tablet (800 mg total) by mouth every 8 (eight) hours as needed., Disp: 30 tablet, Rfl: 0   Prenatal Vit-Fe Fumarate-FA (PREPLUS) 27-1 MG TABS, Take 1 tablet by mouth daily., Disp: 30 tablet, Rfl: 8   silver sulfADIAZINE (SILVADENE) 1 % cream, Apply 1 application. topically daily., Disp: 50 g, Rfl: 0   trimethoprim-polymyxin b (POLYTRIM) ophthalmic solution, Place 1 drop into the right eye every 6 (six) hours., Disp: 10 mL, Rfl: 0   Medications ordered in this encounter:  Meds ordered this encounter  Medications   amoxicillin-clavulanate (AUGMENTIN) 875-125 MG tablet  Sig: Take 1 tablet by mouth 2 (two) times daily.    Dispense:  20 tablet    Refill:  0    Order Specific Question:   Supervising Provider    Answer:   Merrilee Jansky [id number]   chlorhexidine (PERIDEX) 0.12 % solution    Sig: Use as directed 10 mLs in the mouth or throat 2 (two) times daily.    Dispense:  120 mL    Refill:  0    Order Specific Question:   Supervising Provider    Answer:   Merrilee Jansky [id number]   diclofenac (VOLTAREN) 75 MG EC tablet    Sig: Take 1 tablet (75 mg total) by mouth 2 (two) times daily with a  meal for 7 days.    Dispense:  14 tablet    Refill:  0    Order Specific Question:   Supervising Provider    Answer:   Merrilee Jansky [id number]     *If you need refills on other medications prior to your next appointment, please contact your pharmacy*  Follow-Up: Call back or seek an in-person evaluation if the symptoms worsen or if the condition fails to improve as anticipated.  [facility] [phone]  Other Instructions Please start taking the antibiotic prescribed twice daily until gone. Take with food to prevent upset stomach.  This will help prevent any worsening infection. Start taking the medication for the pain as prescribed.  Use only for severe pain.  Must take with food to prevent upset stomach. Do NOT take any additional over the counter pain medications (Do not take advil, aleve, naproxen, ibuprofen, motrin) Use the mouth rinse twice daily. Please follow-up with a dentist as soon as possible for further evaluation.  If you develop a fever or swelling in your face/ neck, head to the ER.   If you have been instructed to have an in-person evaluation today at a local Urgent Care facility, please use the link below. It will take you to a list of all of our available [facility], including address, phone number and hours of operation. Please do not delay care.  [facility]  If you or a family member do not have a primary care provider, use the link below to schedule a visit and establish care. When you choose a [facility] primary care physician or advanced practice provider, you gain a long-term partner in health. Find a Primary Care Provider  Learn more about [facility]'s in-office and virtual care options: [facility] - Get Care Now

## 2022-04-14 NOTE — Progress Notes (Signed)
Virtual Visit Consent   [name], you are scheduled for a virtual visit with a [facility] provider today. Just as with appointments in the office, your consent must be obtained to participate. Your consent will be active for this visit and any virtual visit you may have with one of our providers in the next 365 days. If you have a MyChart account, a copy of this consent can be sent to you electronically.  As this is a virtual visit, video technology does not allow for your provider to perform a traditional examination. This may limit your provider's ability to fully assess your condition. If your provider identifies any concerns that need to be evaluated in person or the need to arrange testing (such as labs, EKG, etc.), we will make arrangements to do so. Although advances in technology are sophisticated, we cannot ensure that it will always work on either your end or our end. If the connection with a video visit is poor, the visit may have to be switched to a telephone visit. With either a video or telephone visit, we are not always able to ensure that we have a secure connection.  By engaging in this virtual visit, you consent to the provision of healthcare and authorize for your insurance to be billed (if applicable) for the services provided during this visit. Depending on your insurance coverage, you may receive a charge related to this service.  I need to obtain your verbal consent now. Are you willing to proceed with your visit today? KATHALENE SPORER has provided verbal consent on 04/14/2022 for a virtual visit (video or telephone). Maretta Bees, PA  Date: 04/14/2022 5:34 PM  Virtual Visit via Video Note   I, Ann Brown, connected with  Ann Brown  ([id number], August 04, 1985) on 04/14/22 at  5:30 PM EST by a video-enabled telemedicine application and verified that I am speaking with the correct person using two identifiers.  Location: Patient: Virtual Visit Location  Patient: Other: stationary, parked vehicle Provider: Virtual Visit Location Provider: Home Office   I discussed the limitations of evaluation and management by telemedicine and the availability of in person appointments. The patient expressed understanding and agreed to proceed.    History of Present Illness: Ann Brown is a 36 y.o. who identifies as a female who was assigned female at birth, and is being seen today for dental pain.  HPI: 36yo female presents today due to concerns of dental abscess. States she has a bump on the gums on the top left, posterior where she no longer has molars. Called dentist and cannot get in until Jan 3. Bump is painful, denies discharge from it but states she has a foul taste in her mouth. Does have L submandibular lymphadenopathy. Denies any swelling to face or jaw. Denies trismus. Denies fever. Has tried tylenol which has not helped with the pain. Has had abscess in the past.   Problems:  Patient Active Problem List   Diagnosis Date Noted   S/P emergency cesarean section 02/25/2021   Tooth pain 01/20/2021   Mass of mediastinum 06/30/2019   Abnormal mammogram 01/11/2017   Mild persistent asthma without complication 08/17/2016   Chronic lower back pain 09/24/2014   History of thyroid disease 02/24/2013   TOBACCO USER 04/25/2009    Allergies:  Allergies  Allergen Reactions   Other    Ketorolac Other (See Comments)    Stomach pains    Medications:  Current Outpatient Medications:  amoxicillin-clavulanate (AUGMENTIN) 875-125 MG tablet, Take 1 tablet by mouth 2 (two) times daily., Disp: 20 tablet, Rfl: 0   chlorhexidine (PERIDEX) 0.12 % solution, Use as directed 10 mLs in the mouth or throat 2 (two) times daily., Disp: 120 mL, Rfl: 0   diclofenac (VOLTAREN) 75 MG EC tablet, Take 1 tablet (75 mg total) by mouth 2 (two) times daily with a meal for 7 days., Disp: 14 tablet, Rfl: 0   acetaminophen (TYLENOL) 325 MG tablet, Take 2 tablets (650 mg  total) by mouth every 6 (six) hours as needed for moderate pain., Disp: 60 tablet, Rfl: 0   albuterol (PROVENTIL) (2.5 MG/3ML) 0.083% nebulizer solution, Take 3 mLs (2.5 mg total) by nebulization every 6 (six) hours as needed for wheezing or shortness of breath., Disp: 75 mL, Rfl: 0   albuterol (VENTOLIN HFA) 108 (90 Base) MCG/ACT inhaler, Inhale 1-2 puffs into the lungs every 6 (six) hours as needed for wheezing or shortness of breath., Disp: 18 g, Rfl: 0   amphetamine-dextroamphetamine (ADDERALL) 20 MG tablet, Take 20 mg by mouth 2 (two) times daily., Disp: , Rfl:    Budeson-Glycopyrrol-Formoterol (BREZTRI AEROSPHERE) 160-9-4.8 MCG/ACT AERO, Inhale 1-2 puffs into the lungs in the morning and at bedtime., Disp: 10.7 g, Rfl: 0   buPROPion (WELLBUTRIN SR) 100 MG 12 hr tablet, take 1 tablet by oral route every morning, Disp: , Rfl:    busPIRone (BUSPAR) 5 MG tablet, Take 5 mg by mouth 2 (two) times daily as needed., Disp: , Rfl:    cetirizine (ZYRTEC) 10 MG tablet, Take 1 tablet (10 mg total) by mouth daily., Disp: 30 tablet, Rfl: 0   esomeprazole (NEXIUM) 40 MG capsule, Take 1 capsule (40 mg total) by mouth 2 (two) times daily before a meal., Disp: 60 capsule, Rfl: 5   fluticasone (FLONASE) 50 MCG/ACT nasal spray, Place 1 spray into both nostrils daily., Disp: 11.1 mL, Rfl: 2   hydrOXYzine (ATARAX/VISTARIL) 10 MG tablet, Take 10 mg by mouth at bedtime as needed., Disp: , Rfl:    ibuprofen (ADVIL) 600 MG tablet, Take 1 tablet (600 mg total) by mouth every 8 (eight) hours as needed., Disp: 30 tablet, Rfl: 0   ibuprofen (ADVIL) 800 MG tablet, Take 1 tablet (800 mg total) by mouth every 8 (eight) hours as needed., Disp: 30 tablet, Rfl: 0   Prenatal Vit-Fe Fumarate-FA (PREPLUS) 27-1 MG TABS, Take 1 tablet by mouth daily., Disp: 30 tablet, Rfl: 8   silver sulfADIAZINE (SILVADENE) 1 % cream, Apply 1 application. topically daily., Disp: 50 g, Rfl: 0   trimethoprim-polymyxin b (POLYTRIM) ophthalmic solution,  Place 1 drop into the right eye every 6 (six) hours., Disp: 10 mL, Rfl: 0  Observations/Objective: Patient is well-developed, well-nourished in no acute distress.  Resting in parked car.  Head is normocephalic, atraumatic.  No labored breathing.  Speech is clear and coherent with logical content.  Patient is alert and oriented at baseline.  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 Unable to fully visualize painful lesion, but no facial swelling or erythema of skin.  Pain with palpation of submandibular lymph node on L.  Assessment and Plan: 1. Dental abscess  Pt has appointment 04/25/22 with dentist. Pt without systemic s/sx. Will start BID augmentin x 10 days, chlorhexdine mouthwash, and diclofenac. No NSAID allergies, has had stomach pain if taken without food. Must stop all other OTC nsaids.  Follow Up Instructions: I discussed the assessment and treatment plan with the patient. The patient was  provided an opportunity to ask questions and all were answered. The patient agreed with the plan and demonstrated an understanding of the instructions.  A copy of instructions were sent to the patient via MyChart unless otherwise noted below.    The patient was advised to call back or seek an in-person evaluation if the symptoms worsen or if the condition fails to improve as anticipated.  Time:  I spent 7 minutes with the patient via telehealth technology discussing the above problems/concerns.    Brier Firebaugh L Vallarie Fei, PA

## 2022-09-14 ENCOUNTER — Ambulatory Visit: Payer: Medicaid Other

## 2022-09-18 ENCOUNTER — Ambulatory Visit
Admission: RE | Admit: 2022-09-18 | Discharge: 2022-09-18 | Disposition: A | Discharge status: Home / Self Care | Payer: Medicaid Other | Source: Ambulatory Visit | Attending: Internal Medicine | Admitting: Internal Medicine

## 2022-09-18 ENCOUNTER — Ambulatory Visit (INDEPENDENT_AMBULATORY_CARE_PROVIDER_SITE_OTHER): Payer: Medicaid Other

## 2022-09-18 VITALS — BP 116/78 | HR 101 | Temp 98.5°F | Resp 16

## 2022-09-18 DIAGNOSIS — R0781 Pleurodynia: Secondary | ICD-10-CM

## 2022-09-18 MED ORDER — LIDOCAINE 5 % EX PTCH
1.0000 | MEDICATED_PATCH | CUTANEOUS | 0 refills | Status: AC
Start: 2022-09-18 — End: ?

## 2022-09-18 NOTE — ED Provider Notes (Signed)
[facility]    CSN: [id number] Arrival date & time: 09/18/22  1348      History   Chief Complaint No chief complaint on file.   HPI Ann Brown is a 37 y.o. female.   Patient presents with left rib pain that has been present for 3 weeks.  Reports that her son had RSV which then caused her to get RSV.  She reports that she had a strong coughing fit when she felt something pop and twist in her left rib area.  Taking Advil and Tylenol for pain with minimal improvement.  Reports she does have pain with inspiration but is not reporting any shortness of breath.     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   GERD (gastroesophageal reflux disease)    History of gestational diabetes 01/21/2013   Kidney problem    Kideney reflux   Suicidal ideation    Tobacco use    URI 03/01/2009    Patient Active Problem List   Diagnosis Date Noted   S/P emergency cesarean section 02/25/2021   Tooth pain 01/20/2021   Mass of mediastinum 06/30/2019   Abnormal mammogram 01/11/2017   Mild persistent asthma without complication 08/17/2016   Chronic lower back pain 09/24/2014   History of thyroid disease 02/24/2013   TOBACCO USER 04/25/2009    Past Surgical History:  Procedure Laterality Date   BOWEL RESECTION     CESAREAN SECTION N/A 02/25/2021   Procedure: CESAREAN SECTION;  Surgeon: Myna Hidalgo, DO;  Location: [facility] LD ORS;  Service: Obstetrics;  Laterality: N/A;   CHOLECYSTECTOMY     Complex trunk closure  2005   CSF SHUNT  1987   TRACHEOSTOMY  1987   TUBAL LIGATION Bilateral 02/25/2021   Procedure: BILATERAL TUBAL LIGATION;  Surgeon: Myna Hidalgo, DO;  Location: [facility] LD ORS;  Service: Obstetrics;  Laterality: Bilateral;    OB History     Gravida  4   Para  3   Term  3   Preterm  0   AB  1   Living  3       SAB  0   IAB  0   Ectopic  0   Multiple  0   Live Births  3            Home Medications    Prior to Admission medications   Medication Sig Start Date End Date Taking? Authorizing Provider  lidocaine (LIDODERM) 5 % Place 1 patch onto the skin daily. Remove & Discard patch within 12 hours or as directed by MD 09/18/22  Yes Gustavus Bryant, FNP  acetaminophen (TYLENOL) 325 MG tablet Take 2 tablets (650 mg total) by mouth every 6 (six) hours as needed for moderate pain. 02/27/21   Warner Mccreedy, MD  albuterol (PROVENTIL) (2.5 MG/3ML) 0.083% nebulizer solution Take 3 mLs (2.5 mg total) by nebulization every 6 (six) hours as needed for wheezing or shortness of breath. 05/26/19   Cathie Hoops, Amy V, PA-C  albuterol (VENTOLIN HFA) 108 (90 Base) MCG/ACT inhaler Inhale 1-2 puffs into the lungs every 6 (six) hours as needed for wheezing or shortness of breath. 05/04/20   Wieters, Hallie C, PA-C  amoxicillin-clavulanate (AUGMENTIN) 875-125 MG tablet Take 1 tablet by mouth 2 (two) times daily. 04/14/22  Crain, Whitney L, PA  amphetamine-dextroamphetamine (ADDERALL) 20 MG tablet Take 20 mg by mouth 2 (two) times daily.    [provider]  Budeson-Glycopyrrol-Formoterol (BREZTRI AEROSPHERE) 160-9-4.8 MCG/ACT AERO Inhale 1-2 puffs into the lungs in the morning and at bedtime. 02/16/21   Adam Phenix, MD  buPROPion [facility] SR) 100 MG 12 hr tablet take 1 tablet by oral route every morning 07/10/17   [provider]  busPIRone (BUSPAR) 5 MG tablet Take 5 mg by mouth 2 (two) times daily as needed. 07/22/20   [provider]  cetirizine (ZYRTEC) 10 MG tablet Take 1 tablet (10 mg total) by mouth daily. 01/09/21   Gustavus Bryant, FNP  chlorhexidine (PERIDEX) 0.12 % solution Use as directed 10 mLs in the mouth or throat 2 (two) times daily. 04/14/22   Crain, Whitney L, PA  esomeprazole (NEXIUM) 40 MG capsule Take 1 capsule (40 mg total) by mouth 2 (two) times daily before a meal. 11/29/20    Venora Maples, MD  fluticasone (FLONASE) 50 MCG/ACT nasal spray Place 1 spray into both nostrils daily. 12/13/20   Edd Arbour R, [name]  hydrOXYzine (ATARAX/VISTARIL) 10 MG tablet Take 10 mg by mouth at bedtime as needed. 09/06/20   [provider]  ibuprofen (ADVIL) 600 MG tablet Take 1 tablet (600 mg total) by mouth every 8 (eight) hours as needed. 11/13/21   Margaretann Loveless, PA-C  ibuprofen (ADVIL) 800 MG tablet Take 1 tablet (800 mg total) by mouth every 8 (eight) hours as needed. 12/17/21   Leath-Warren, Sadie Haber, NP  Prenatal Vit-Fe Fumarate-FA (PREPLUS) 27-1 MG TABS Take 1 tablet by mouth daily. 10/04/20   Venora Maples, MD  silver sulfADIAZINE (SILVADENE) 1 % cream Apply 1 application. topically daily. 09/13/21   Gustavus Bryant, FNP  trimethoprim-polymyxin b (POLYTRIM) ophthalmic solution Place 1 drop into the right eye every 6 (six) hours. 07/12/21   Jannifer Rodney A, FNP  Fluticasone-Salmeterol (ADVAIR) 100-50 MCG/DOSE AEPB Inhale 1 puff into the lungs 2 (two) times daily. 05/27/17 05/04/20  Howard Pouch, MD    Family History Family History  Problem Relation Age of Onset   Multiple sclerosis Mother    Cervical cancer Mother    Breast cancer Maternal Grandmother        unsure of age    Social History Social History   Tobacco Use   Smoking status: Every Day    Packs/day: 1    Types: Cigarettes, E-cigarettes   Smokeless tobacco: Never   Tobacco comments:    Also uses ecigarette  Vaping Use   Vaping Use: Never used  Substance Use Topics   Alcohol use: No    Alcohol/week: 0.0 standard drinks of alcohol   Drug use: No     Allergies   Other and Ketorolac   Review of Systems Review of Systems Per HPI  Physical Exam Triage Vital Signs ED Triage Vitals  Enc Vitals Group     BP 09/18/22 1421 116/78     Pulse Rate 09/18/22 1421 (!) 101     Resp 09/18/22 1421 16     Temp 09/18/22 1421 98.5 F (36.9 C)     Temp Source 09/18/22 1421 Oral     SpO2  09/18/22 1421 98 %     Weight --      Height --      Head Circumference --      Peak Flow --      Pain Score 09/18/22 1424  8     Pain Loc --      Pain Edu? --      Excl. in GC? --    No data found.  Updated Vital Signs BP 116/78 (BP Location: Left Arm)   Pulse (!) 101   Temp 98.5 F (36.9 C) (Oral)   Resp 16   LMP 09/18/2022   SpO2 98%   Breastfeeding No   Visual Acuity Right Eye Distance:   Left Eye Distance:   Bilateral Distance:    Right Eye Near:   Left Eye Near:    Bilateral Near:     Physical Exam Constitutional:      General: She is not in acute distress.    Appearance: Normal appearance. She is not toxic-appearing or diaphoretic.  HENT:     Head: Normocephalic and atraumatic.  Eyes:     Extraocular Movements: Extraocular movements intact.     Conjunctiva/sclera: Conjunctivae normal.  Cardiovascular:     Rate and Rhythm: Normal rate and regular rhythm.     Pulses: Normal pulses.     Heart sounds: Normal heart sounds.  Pulmonary:     Effort: Pulmonary effort is normal. No respiratory distress.     Breath sounds: Normal breath sounds. No stridor. No wheezing, rhonchi or rales.  Chest:       Comments: Patient has tenderness to palpation to left anterior lower ribs.  No obvious swelling or discoloration noted.  No crepitus noted. Neurological:     General: No focal deficit present.     Mental Status: She is alert and oriented to person, place, and time. Mental status is at baseline.  Psychiatric:        Mood and Affect: Mood normal.        Behavior: Behavior normal.        Thought Content: Thought content normal.        Judgment: Judgment normal.      UC Treatments / Results  Labs (all labs ordered are listed, but only abnormal results are displayed) Labs Reviewed - No data to display  EKG   Radiology DG Ribs Unilateral W/Chest Left  Result Date: 09/18/2022 CLINICAL DATA:  Rib pain , twisting rib pain. EXAM: LEFT RIBS AND CHEST - 3+ VIEW  COMPARISON:  None Available. FINDINGS: No fracture or other bone lesions are seen involving the ribs. There is no evidence of pneumothorax or pleural effusion. Both lungs are clear. Heart size and mediastinal contours are within normal limits. IMPRESSION: No rib fracture identified. Electronically Signed   By: Genevive Bi M.D.   On: 09/18/2022 15:04    Procedures Procedures (including critical care time)  Medications Ordered in UC Medications - No data to display  Initial Impression / Assessment and Plan / UC Course  I have reviewed the triage vital signs and the nursing notes.  Pertinent labs & imaging results that were available during my care of the patient were reviewed by me and considered in my medical decision making (see chart for details).     Left rib x-ray was negative for any acute bony abnormality.  Suspect possible muscular strain/injury given mechanism of injury.  Advised ice application and patient was prescribed lidocaine patches to apply directly to area.  Advised deep breathing techniques as well.  Advised following up with orthopedist if symptoms persist or worsen at provided contact information.  Patient verbalized understanding and was agreeable with plan. Final Clinical Impressions(s) / UC Diagnoses   Final diagnoses:  Rib pain  on left side     Discharge Instructions      X-ray was normal.  Suspect muscular strain/injury.  I have prescribed you lidocaine patch to apply to area.  Also advise ice application and deep breathing as we discussed.  Follow-up with orthopedist if symptoms persist or worsen.     ED Prescriptions     Medication Sig Dispense Auth. Provider   lidocaine (LIDODERM) 5 % Place 1 patch onto the skin daily. Remove & Discard patch within 12 hours or as directed by MD 30 patch Caprina Wussow, Acie Fredrickson, FNP      PDMP not reviewed this encounter.   Gustavus Bryant, [place] 09/18/22 [phone]

## 2022-09-18 NOTE — ED Triage Notes (Signed)
Pt c/o rib pain pt states her son was diagnosed with RSV she ended up catching it coughed so hard that she felt something "twist and rip at the same time. Pt states he is still in pain from this.

## 2022-09-18 NOTE — Discharge Instructions (Signed)
X-ray was normal.  Suspect muscular strain/injury.  I have prescribed you lidocaine patch to apply to area.  Also advise ice application and deep breathing as we discussed.  Follow-up with orthopedist if symptoms persist or worsen.

## 2022-10-03 ENCOUNTER — Encounter: Payer: Self-pay | Admitting: Family

## 2022-10-03 ENCOUNTER — Ambulatory Visit (INDEPENDENT_AMBULATORY_CARE_PROVIDER_SITE_OTHER): Payer: Medicaid Other | Admitting: Family

## 2022-10-03 DIAGNOSIS — R0781 Pleurodynia: Secondary | ICD-10-CM | POA: Diagnosis not present

## 2022-10-03 MED ORDER — METHOCARBAMOL 500 MG PO TABS: 500.0000 mg | ORAL_TABLET | Freq: Four times a day (QID) | ORAL | 0 refills | Status: AC | PRN

## 2022-10-03 MED ORDER — NABUMETONE 750 MG PO TABS: 750.0000 mg | ORAL_TABLET | Freq: Two times a day (BID) | ORAL | 3 refills | Status: AC | PRN

## 2022-10-03 MED ORDER — LIDOCAINE 5 % EX PTCH
1.0000 | MEDICATED_PATCH | CUTANEOUS | 0 refills | Status: AC
Start: 2022-10-03 — End: ?

## 2022-10-03 NOTE — Progress Notes (Signed)
Office Visit Note   Patient: Ann Brown           Date of Birth: 20-Sep-1985           MRN: [id number] Visit Date: 10/03/2022              Requested by: Rema Fendt, NP [address] [place],  [place] [address] PCP: Rema Fendt, NP  No chief complaint on file.     HPI: The patient is a 37 year old woman who presents with a 5-week now history of left-sided rib pain and flank pain she reports that this began when her son had RSV which she also contracted she was having a coughing fit when she felt something pop and twisted her left anterior rib area she has been using Advil and Tylenol for pain without improvement.  She has had an emergency department visit for the same at this visit repeat radiographs were performed which were reassuring no fracture.  A lidocaine patch was written but unfortunately she did require prior authorization so she has not been able to pick this up yet she does not feel she has improved much in the last 3 weeks  Denies shortness of breath or difficulty breathing  Pain is brought on by deep inspiration as well as flexion extension of the spine and turning side-to-side or pressure to her ribs on the left.  States that muscle spasms in the left flank wake her at night  Assessment & Plan: Visit Diagnoses: No diagnosis found.  Plan: Reassurance provided discussed costochondritis.  Will provide his prescription for the lidocaine patches as well as anti-inflammatories and muscle relaxers she will follow-up if she fails to improve  Follow-Up Instructions: No follow-ups on file.   Ortho Exam  Patient is alert, oriented, no adenopathy, well-dressed, normal affect, normal respiratory effort.  Respirations easy easily conversing without shortness of breath.  No cough. Diffuse tenderness to the left anterior lower ribs as well as some soft tissue tenderness.    Imaging: No results found. No images are attached to the encounter.  Labs: Lab  Results  Component Value Date   HGBA1C 5.1 08/04/2020   REPTSTATUS 01/12/2021 FINAL 01/09/2021   CULT  01/09/2021    NO GROUP A STREP (S.PYOGENES) ISOLATED Performed at [facility], [address], [place], [place] [address]    LABORGA NO GROWTH 03/30/2013     Lab Results  Component Value Date   ALBUMIN 2.2 (L) 01/16/2021   ALBUMIN 3.8 07/19/2017   ALBUMIN 4.5 07/06/2016    No results found for: "MG" No results found for: "VD25OH"  No results found for: "PREALBUMIN"    Latest Ref Rng & Units 02/26/2021    4:56 AM 01/16/2021    5:22 PM 12/13/2020    9:34 AM  CBC EXTENDED  WBC 4.0 - 10.5 K/uL 17.0  10.6  10.7   RBC 3.87 - 5.11 MIL/uL 3.04  3.44  3.63   Hemoglobin 12.0 - 15.0 g/dL 75.6  43.3  29.5   HCT 36.0 - 46.0 % 30.9  34.8  36.5   Platelets 150 - 400 K/uL 221  207  228   NEUT# 1.7 - 7.7 K/uL  7.3    Lymph# 0.7 - 4.0 K/uL  2.4       There is no height or weight on file to calculate BMI.  Orders:  No orders of the defined types were placed in this encounter.  No orders of the  defined types were placed in this encounter.    Procedures: No procedures performed  Clinical Data: No additional findings.  ROS:  All other systems negative, except as noted in the HPI. Review of Systems  Objective: Vital Signs: LMP 09/18/2022   Specialty Comments:  No specialty comments available.  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   GERD  (gastroesophageal reflux disease)    History of gestational diabetes 01/21/2013   Kidney problem    Kideney reflux   Suicidal ideation    Tobacco use    URI 03/01/2009    Family History  Problem Relation Age of Onset   Multiple sclerosis Mother    Cervical cancer Mother    Breast cancer Maternal Grandmother        unsure of age    Past Surgical History:  Procedure Laterality Date   BOWEL RESECTION     CESAREAN SECTION N/A 02/25/2021   Procedure: CESAREAN SECTION;  Surgeon: Myna Hidalgo, DO;  Location: [facility] LD ORS;  Service: Obstetrics;  Laterality: N/A;   CHOLECYSTECTOMY     Complex trunk closure  2005   CSF SHUNT  1987   TRACHEOSTOMY  1987   TUBAL LIGATION Bilateral 02/25/2021   Procedure: BILATERAL TUBAL LIGATION;  Surgeon: Myna Hidalgo, DO;  Location: [facility] ORS;  Service: Obstetrics;  Laterality: Bilateral;   Social History   Occupational History   Not on file  Tobacco Use   Smoking status: Every Day    Packs/day: 1    Types: Cigarettes, E-cigarettes   Smokeless tobacco: Never   Tobacco comments:    Also uses ecigarette  Vaping Use   Vaping Use: Never used  Substance and Sexual Activity   Alcohol use: No    Alcohol/week: 0.0 standard drinks of alcohol   Drug use: No   Sexual activity: Yes    Birth control/protection: None

## 2022-12-21 ENCOUNTER — Telehealth: Payer: Medicaid Other | Admitting: Family Medicine

## 2022-12-21 DIAGNOSIS — K047 Periapical abscess without sinus: Secondary | ICD-10-CM

## 2022-12-21 MED ORDER — CLINDAMYCIN HCL 300 MG PO CAPS: 300.0000 mg | ORAL_CAPSULE | Freq: Three times a day (TID) | ORAL | 0 refills | Status: AC

## 2022-12-21 NOTE — Progress Notes (Signed)

## 2023-06-16 ENCOUNTER — Emergency Department (HOSPITAL_COMMUNITY): Payer: Self-pay

## 2023-06-16 ENCOUNTER — Encounter (HOSPITAL_COMMUNITY): Payer: Self-pay | Admitting: Emergency Medicine

## 2023-06-16 ENCOUNTER — Other Ambulatory Visit: Payer: Self-pay

## 2023-06-16 ENCOUNTER — Emergency Department (HOSPITAL_COMMUNITY)
Admission: EM | Admit: 2023-06-16 | Discharge: 2023-06-16 | Disposition: A | Discharge status: Home / Self Care | Payer: Self-pay | Attending: Emergency Medicine | Admitting: Emergency Medicine

## 2023-06-16 DIAGNOSIS — W01198A Fall on same level from slipping, tripping and stumbling with subsequent striking against other object, initial encounter: Secondary | ICD-10-CM | POA: Insufficient documentation

## 2023-06-16 DIAGNOSIS — Z720 Tobacco use: Secondary | ICD-10-CM | POA: Insufficient documentation

## 2023-06-16 DIAGNOSIS — Y9301 Activity, walking, marching and hiking: Secondary | ICD-10-CM | POA: Insufficient documentation

## 2023-06-16 DIAGNOSIS — K0381 Cracked tooth: Secondary | ICD-10-CM | POA: Insufficient documentation

## 2023-06-16 DIAGNOSIS — W19XXXA Unspecified fall, initial encounter: Secondary | ICD-10-CM

## 2023-06-16 DIAGNOSIS — S01512A Laceration without foreign body of oral cavity, initial encounter: Secondary | ICD-10-CM | POA: Insufficient documentation

## 2023-06-16 DIAGNOSIS — J4530 Mild persistent asthma, uncomplicated: Secondary | ICD-10-CM | POA: Insufficient documentation

## 2023-06-16 LAB — I-STAT CHEM 8, ED
BUN: 11 mg/dL (ref 6–20)
Calcium, Ion: 1.19 mmol/L (ref 1.15–1.40)
Chloride: 107 mmol/L (ref 98–111)
Creatinine, Ser: 0.6 mg/dL (ref 0.44–1.00)
Glucose, Bld: 92 mg/dL (ref 70–99)
HCT: 35 % — ABNORMAL LOW (ref 36.0–46.0)
Hemoglobin: 11.9 g/dL — ABNORMAL LOW (ref 12.0–15.0)
Potassium: 3.3 mmol/L — ABNORMAL LOW (ref 3.5–5.1)
Sodium: 139 mmol/L (ref 135–145)
TCO2: 23 mmol/L (ref 22–32)

## 2023-06-16 LAB — I-STAT CG4 LACTIC ACID, ED: Lactic Acid, Venous: 0.7 mmol/L (ref 0.5–1.9)

## 2023-06-16 MED ORDER — IOHEXOL 300 MG/ML  SOLN
100.0000 mL | Freq: Once | INTRAMUSCULAR | Status: AC | PRN
  Administered 2023-06-16: 100 mL via INTRAVENOUS

## 2023-06-16 MED ORDER — NAPROXEN 500 MG PO TABS
500.0000 mg | ORAL_TABLET | Freq: Once | ORAL | Status: AC
  Administered 2023-06-16: 500 mg via ORAL
  Filled 2023-06-16: qty 1

## 2023-06-16 NOTE — ED Notes (Signed)
 Patient transported to CT

## 2023-06-16 NOTE — ED Triage Notes (Signed)
 Patient while walking into a storage building, hitting her face resulting in a nose bleed and hurting her right 4th finger.  Patient endorses pain to nose and right fourth finger. Patient denies LOC.  Patient denies being on blood thinners.

## 2023-06-16 NOTE — ED Provider Notes (Signed)
 [place] EMERGENCY DEPARTMENT AT [facility] Provider Note   CSN: [id number] Arrival date & time: 06/16/23  1909     History  Chief Complaint  Patient presents with   Ann Brown    Ann Brown is a 38 y.o. female with past medical history of thyroid disease, tobacco use, mild persistent asthma, dental abnormalities presents to emergency department for evaluation of nose pain and right finger pain following fall today.  She reports that she was carrying a ceramic pottery mold when she tripped coming into her mother's apartment.  She fell face first and was unable to put her hands in front of her onto floor.  She then filled 3 entire paper towels of blood from epistaxis but was able to get blood controlled.  She denies LOC, visual disturbances, AMS, complaints prior to fall.  Fall Pertinent negatives include no chest pain, no abdominal pain, no headaches and no shortness of breath.      Home Medications Prior to Admission medications   Medication Sig Start Date End Date Taking? Authorizing Provider  acetaminophen (TYLENOL) 325 MG tablet Take 2 tablets (650 mg total) by mouth every 6 (six) hours as needed for moderate pain. 02/27/21   Warner Mccreedy, MD  albuterol (PROVENTIL) (2.5 MG/3ML) 0.083% nebulizer solution Take 3 mLs (2.5 mg total) by nebulization every 6 (six) hours as needed for wheezing or shortness of breath. 05/26/19   Cathie Hoops, Amy V, PA-C  albuterol (VENTOLIN HFA) 108 (90 Base) MCG/ACT inhaler Inhale 1-2 puffs into the lungs every 6 (six) hours as needed for wheezing or shortness of breath. 05/04/20   Wieters, Hallie C, PA-C  amoxicillin-clavulanate (AUGMENTIN) 875-125 MG tablet Take 1 tablet by mouth 2 (two) times daily. 04/14/22   Crain, Whitney L, PA  amphetamine-dextroamphetamine (ADDERALL) 20 MG tablet Take 20 mg by mouth 2 (two) times daily.    [provider]  Budeson-Glycopyrrol-Formoterol (BREZTRI AEROSPHERE) 160-9-4.8 MCG/ACT AERO Inhale 1-2 puffs into the  lungs in the morning and at bedtime. 02/16/21   Adam Phenix, MD  buPROPion [facility] SR) 100 MG 12 hr tablet take 1 tablet by oral route every morning 07/10/17   [provider]  busPIRone (BUSPAR) 5 MG tablet Take 5 mg by mouth 2 (two) times daily as needed. 07/22/20   [provider]  cetirizine (ZYRTEC) 10 MG tablet Take 1 tablet (10 mg total) by mouth daily. 01/09/21   Gustavus Bryant, FNP  chlorhexidine (PERIDEX) 0.12 % solution Use as directed 10 mLs in the mouth or throat 2 (two) times daily. 04/14/22   Crain, Whitney L, PA  esomeprazole (NEXIUM) 40 MG capsule Take 1 capsule (40 mg total) by mouth 2 (two) times daily before a meal. 11/29/20   Venora Maples, MD  fluticasone (FLONASE) 50 MCG/ACT nasal spray Place 1 spray into both nostrils daily. 12/13/20   Edd Arbour R, [name]  hydrOXYzine (ATARAX/VISTARIL) 10 MG tablet Take 10 mg by mouth at bedtime as needed. 09/06/20   [provider]  ibuprofen (ADVIL) 600 MG tablet Take 1 tablet (600 mg total) by mouth every 8 (eight) hours as needed. 11/13/21   Margaretann Loveless, PA-C  ibuprofen (ADVIL) 800 MG tablet Take 1 tablet (800 mg total) by mouth every 8 (eight) hours as needed. 12/17/21   Leath-Warren, Sadie Haber, NP  lidocaine (LIDODERM) 5 % Place 1 patch onto the skin daily. Remove & Discard patch within 12 hours or as directed by MD 09/18/22   Ervin Knack  E, FNP  lidocaine (LIDODERM) 5 % Place 1 patch onto the skin daily. Remove & Discard patch within 12 hours or as directed by MD 10/03/22   Adonis Huguenin, NP  methocarbamol (ROBAXIN) 500 MG tablet Take 1 tablet (500 mg total) by mouth every 6 (six) hours as needed for muscle spasms. 10/03/22   Adonis Huguenin, NP  Prenatal Vit-Fe Fumarate-FA (PREPLUS) 27-1 MG TABS Take 1 tablet by mouth daily. 10/04/20   Venora Maples, MD  silver sulfADIAZINE (SILVADENE) 1 % cream Apply 1 application. topically daily. 09/13/21   Gustavus Bryant, FNP  trimethoprim-polymyxin b  (POLYTRIM) ophthalmic solution Place 1 drop into the right eye every 6 (six) hours. 07/12/21   Jannifer Rodney A, FNP  Fluticasone-Salmeterol (ADVAIR) 100-50 MCG/DOSE AEPB Inhale 1 puff into the lungs 2 (two) times daily. 05/27/17 05/04/20  Howard Pouch, MD      Allergies    Other and Ketorolac    Review of Systems   Review of Systems  Constitutional:  Negative for chills, fatigue and fever.  Respiratory:  Negative for cough, chest tightness, shortness of breath and wheezing.   Cardiovascular:  Negative for chest pain and palpitations.  Gastrointestinal:  Negative for abdominal pain, constipation, diarrhea, nausea and vomiting.  Neurological:  Negative for dizziness, seizures, weakness, light-headedness, numbness and headaches.    Physical Exam Updated Vital Signs BP 134/80 (BP Location: Right Arm)   Pulse 98   Temp 98 F (36.7 C) (Oral)   Resp 17   Wt 52.2 kg   LMP 05/26/2023 (Approximate)   SpO2 100%   BMI 22.46 kg/m  Physical Exam Vitals and nursing note reviewed.  Constitutional:      General: She is not in acute distress.    Appearance: Normal appearance. She is not diaphoretic.  HENT:     Head: Normocephalic and atraumatic. No raccoon eyes, Battle's sign or laceration.      Comments: No hematoma nor TTP of cranium No crepitus to facial bones    Right Ear: External ear normal. No hemotympanum.     Left Ear: External ear normal. No hemotympanum.     Nose: Nasal tenderness present.     Right Nostril: No epistaxis or septal hematoma.     Left Nostril: No epistaxis or septal hematoma.     Right Sinus: No maxillary sinus tenderness or frontal sinus tenderness.     Left Sinus: No maxillary sinus tenderness or frontal sinus tenderness.     Mouth/Throat:     Mouth: Mucous membranes are moist. No injury or lacerations.     Dentition: Abnormal dentition (past broken teeth and extractions). No dental abscesses.     Comments: Nonhemorrhaging laceration to inside of bottom oral  mucosa Eyes:     General: Lids are normal. Vision grossly intact. No visual field deficit.       Right eye: No discharge.        Left eye: No discharge.     Extraocular Movements: Extraocular movements intact.     Right eye: Normal extraocular motion and no nystagmus.     Left eye: Normal extraocular motion and no nystagmus.     Conjunctiva/sclera: Conjunctivae normal.     Pupils: Pupils are equal, round, and reactive to light.     Comments: No subconjunctival hemorrhage, hyphema, tear drop pupil, or fluid leakage bilaterally  Neck:     Vascular: No carotid bruit.  Cardiovascular:     Rate and Rhythm: Normal rate.  Pulses: Normal pulses.          Radial pulses are 2+ on the right side and 2+ on the left side.       Dorsalis pedis pulses are 2+ on the right side and 2+ on the left side.  Pulmonary:     Effort: Pulmonary effort is normal. No respiratory distress.     Breath sounds: Normal breath sounds. No wheezing.  Chest:     Chest wall: No tenderness.  Abdominal:     General: Bowel sounds are normal. There is no distension.     Palpations: Abdomen is soft.     Tenderness: There is no abdominal tenderness. There is no guarding or rebound.  Musculoskeletal:     Cervical back: Full passive range of motion without pain, normal range of motion and neck supple. No deformity, rigidity or bony tenderness. Normal range of motion.     Thoracic back: No deformity or bony tenderness. Normal range of motion.     Lumbar back: No deformity or bony tenderness. Normal range of motion.     Right hip: No bony tenderness or crepitus.     Left hip: No bony tenderness or crepitus.     Right lower leg: No edema.     Left lower leg: No edema.     Comments: No obvious deformity to joints or long bones Pelvis stable with no shortening or rotation of LE bilaterally  Skin:    General: Skin is warm and dry.     Capillary Refill: Capillary refill takes less than 2 seconds.  Neurological:     General:  No focal deficit present.     Mental Status: She is alert and oriented to person, place, and time. Mental status is at baseline.     GCS: GCS eye subscore is 4. GCS verbal subscore is 5. GCS motor subscore is 6.     Cranial Nerves: Cranial nerves 2-12 are intact. No cranial nerve deficit.     Sensory: Sensation is intact. No sensory deficit.     Motor: Motor function is intact. No weakness, tremor, seizure activity or pronator drift.     Coordination: Coordination is intact. Coordination normal. Finger-Nose-Finger Test and Heel to [facility] Test normal. Rapid alternating movements normal.     Gait: Gait is intact. Gait normal.     Deep Tendon Reflexes: Reflexes are normal and symmetric. Reflexes normal.     Comments: following commands appropriately and ambulates without difficulty     ED Results / Procedures / Treatments   Labs (all labs ordered are listed, but only abnormal results are displayed) Labs Reviewed  I-STAT CHEM 8, ED - Abnormal; Notable for the following components:      Result Value   Potassium 3.3 (*)    Hemoglobin 11.9 (*)    HCT 35.0 (*)    All other components within normal limits    EKG None  Radiology CT Maxillofacial W Contrast Result Date: 06/16/2023 CLINICAL DATA:  Facial trauma EXAM: CT MAXILLOFACIAL WITH CONTRAST TECHNIQUE: Multidetector CT imaging of the maxillofacial structures was performed with intravenous contrast. Multiplanar CT image reconstructions were also generated. RADIATION DOSE REDUCTION: This exam was performed according to the departmental dose-optimization program which includes automated exposure control, adjustment of the mA and/or kV according to patient size and/or use of iterative reconstruction technique. CONTRAST:  OMNIPAQUE IOHEXOL 300 MG/ML  SOLN COMPARISON:  None Available. FINDINGS: Osseous: No facial or mandibular fracture. Orbits: The globes are intact. Normal appearance  of the intra- and extraconal fat. Symmetric extraocular  muscles. Sinuses: No fluid levels or advanced mucosal thickening. Soft tissues: Normal visualized extracranial soft tissues. Limited intracranial: Normal. Other: None. IMPRESSION: No facial or mandibular fracture. Electronically Signed   By: Deatra Robinson M.D.   On: 06/16/2023 22:01   DG Finger Ring Right Result Date: 06/16/2023 CLINICAL DATA:  Fall injuring right fourth finger. EXAM: RIGHT RING FINGER 2+V COMPARISON:  None Available. FINDINGS: Normal bone mineralization. Joint spaces are preserved. Normal alignment. No acute fracture. Possible mild soft tissue swelling at the medial/ulnar aspect of the PIP joint. IMPRESSION: Possible mild soft tissue swelling at the medial/ulnar aspect of the PIP joint. No acute fracture. Electronically Signed   By: Neita Garnet M.D.   On: 06/16/2023 19:58    Procedures Procedures    Medications Ordered in ED Medications  naproxen (NAPROSYN) tablet 500 mg (500 mg Oral Given 06/16/23 2020)  iohexol (OMNIPAQUE) 300 MG/ML solution 100 mL (100 mLs Intravenous Contrast Given 06/16/23 2151)    ED Course/ Medical Decision Making/ A&P                                 Medical Decision Making Amount and/or Complexity of Data Reviewed Radiology: ordered.  Risk Prescription drug management.   Patient presents to the ED for concern of nose and right little finger pain following fall, this involves an extensive number of treatment options, and is a complaint that carries with it a high risk of complications and morbidity.  The differential diagnosis includes fracture, contusion, dislocation, sprain, septal hematoma, facial fracture   Co morbidities that complicate the patient evaluation  Past medical history of broken nose Dental abnormalities   Additional history obtained:  Additional history obtained from Nursing   External records from outside source obtained and reviewed including triage RN note    Imaging Studies ordered:  I ordered imaging  studies including XR, CT maxillofacial  I independently visualized and interpreted imaging which showed no fracture, dislocation, nor septal hematoma I agree with the radiologist interpretation    Medicines ordered and prescription drug management:  I ordered medication including naprosyn  for pain  Reevaluation of the patient after these medicines showed that the patient improved I have reviewed the patients home medicines and have made adjustments as needed   Test Considered:  CT head Neurologically intact, well-appearing, no LOC nor blood thinners.  Low suspicion for ICH otherwise healthy adult     Problem List / ED Course:  Fall X-ray of right little finger as well as CT maxillofacial are negative for fracture, septal hematoma No other injuries found on exam.  Physical exam is not remarkable for alarm signs Will have patient follow-up with ENT as needed as she notes that she has recurrent sinusitis and has had a nasal fracture in the past Discussed ED workup, disposition, return to emergency department cautions with patient expressed understand agrees with plan.  All questions answered to her satisfaction.  She is agreeable to plan.  Discharge instructions provided on paperwork   Reevaluation:  After the interventions noted above, I reevaluated the patient and found that they have :improved   Social Determinants of Health:  No PCP on record.  Provided [place] [facility]   Dispostion:  After consideration of the diagnostic results and the patients response to treatment, I feel that the patent would benefit from outpatient management with symptomatic care.  Dr. Lockie Mola individually assessed patient, reviewed ED workup and agrees with plan Final Clinical Impression(s) / ED Diagnoses Final diagnoses:  Fall, initial encounter    Rx / DC Orders ED Discharge Orders     None         Judithann Sheen, PA 06/16/23 2256    Virgina Norfolk, DO 06/16/23 2312

## 2023-06-16 NOTE — Discharge Instructions (Addendum)
 You for letting us evaluate you today.  Your CT scan was negative for fractures.  You XR of your finger was negative for fracture.  I provided you an ear nose and throat follow-up if you continue to have recurrent sinus infections, nose issues.  You may have a bruise around your nose tomorrow.  You may use Tylenol or ibuprofen as needed for pain  Turn to emergency department if you experience altered mentation, seizures, loss of consciousness, vomiting, repetitive questioning

## 2023-06-26 ENCOUNTER — Encounter (INDEPENDENT_AMBULATORY_CARE_PROVIDER_SITE_OTHER): Payer: Self-pay | Admitting: Otolaryngology

## 2023-06-26 ENCOUNTER — Ambulatory Visit (INDEPENDENT_AMBULATORY_CARE_PROVIDER_SITE_OTHER): Admitting: Otolaryngology

## 2023-06-26 VITALS — BP 131/83 | HR 87 | Ht 60.0 in | Wt 115.0 lb

## 2023-06-26 DIAGNOSIS — J3089 Other allergic rhinitis: Secondary | ICD-10-CM | POA: Diagnosis not present

## 2023-06-26 DIAGNOSIS — F17210 Nicotine dependence, cigarettes, uncomplicated: Secondary | ICD-10-CM

## 2023-06-26 DIAGNOSIS — F17200 Nicotine dependence, unspecified, uncomplicated: Secondary | ICD-10-CM

## 2023-06-26 DIAGNOSIS — J342 Deviated nasal septum: Secondary | ICD-10-CM

## 2023-06-26 DIAGNOSIS — J343 Hypertrophy of nasal turbinates: Secondary | ICD-10-CM

## 2023-06-26 DIAGNOSIS — J3489 Other specified disorders of nose and nasal sinuses: Secondary | ICD-10-CM | POA: Diagnosis not present

## 2023-06-26 DIAGNOSIS — S0993XA Unspecified injury of face, initial encounter: Secondary | ICD-10-CM

## 2023-06-26 DIAGNOSIS — R0981 Nasal congestion: Secondary | ICD-10-CM

## 2023-06-26 DIAGNOSIS — E329 Disease of thymus, unspecified: Secondary | ICD-10-CM

## 2023-06-26 MED ORDER — CETIRIZINE HCL 10 MG PO TABS: 10.0000 mg | ORAL_TABLET | Freq: Every day | ORAL | 11 refills | Status: AC

## 2023-06-26 MED ORDER — FLUTICASONE PROPIONATE 50 MCG/ACT NA SUSP: 2.0000 | Freq: Every day | NASAL | 6 refills | Status: AC

## 2023-06-26 NOTE — Progress Notes (Signed)
 ENT CONSULT:  Reason for Consult: nasal obstruction R > L and facial trauma    HPI: Discussed the use of AI scribe software for clinical note transcription with the patient, who gave verbal consent to proceed.  History of Present Illness   Ann Brown is a 38 year old female who presents with chronic nasal obstruction and pain along the nasal bridge, in the setting of a recent fall. She was referred for evaluation of her nasal obstruction and pain.  She experienced a fall while carrying ceramic molds, resulting in significant nasal bleeding. The bleeding was profuse. It resolved and currently she denies persistent epistaxis.   She has a history of multiple facial traumas, including a broken nose in her teenage years and a hematoma from a door-related injury in her twenties. These past injuries have contributed to her chronic nasal congestion. She thinks she is allergic to cats and dogs, her mother has cats and dogs. She has not been using any nasal sprays or allergy medications recently.  She smokes approximately one pack of cigarettes per day and has a history of quitting for a year before resuming. Smoking likely exacerbates her nasal congestion and overall respiratory health issues.  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 She manages her asthma with Breztri. She also reports a history of anemia and lactic acidosis diagnosed during a recent hospital visit.     Records Reviewed:  ED visit 06/16/23  Ann Brown is a 38 y.o. female with past medical history of thyroid disease, tobacco use, mild persistent asthma, dental abnormalities presents to emergency department for evaluation of nose pain and right finger pain following fall today.  She reports that she was carrying a ceramic pottery mold when she tripped coming into her mother's apartment.  She  fell face first and was unable to put her hands in front of her onto floor.  She then filled 3 entire paper towels of blood from epistaxis but was able to get blood controlled.  She denies LOC, visual disturbances, AMS, complaints prior to fall.   Fall X-ray of right little finger as well as CT maxillofacial are negative for fracture, septal hematoma No other injuries found on exam.  Physical exam is not remarkable for alarm signs Will have patient follow-up with ENT as needed as she notes that she has recurrent sinusitis and has had a nasal fracture in the past Discussed ED workup, disposition, return to emergency department cautions with patient expressed understand agrees with plan.  All questions answered to her satisfaction.  She is agreeable to plan.  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   GERD (gastroesophageal reflux disease)    History of gestational diabetes 01/21/2013   Kidney problem    Kideney reflux   Suicidal ideation    Tobacco use    URI 03/01/2009    Past Surgical History:  Procedure Laterality Date   BOWEL RESECTION     CESAREAN SECTION N/A 02/25/2021   Procedure: CESAREAN SECTION;  Surgeon: Myna Hidalgo, DO;  Location: [facility] LD ORS;  Service: Obstetrics;  Laterality: N/A;   CHOLECYSTECTOMY     Complex  trunk closure  2005   CSF SHUNT  1987   TRACHEOSTOMY  1987   TUBAL LIGATION Bilateral 02/25/2021   Procedure: BILATERAL TUBAL LIGATION;  Surgeon: Myna Hidalgo, DO;  Location: [facility] LD ORS;  Service: Obstetrics;  Laterality: Bilateral;    Family History  Problem Relation Age of Onset   Multiple sclerosis Mother    Cervical cancer Mother    Breast cancer Maternal Grandmother        unsure of age    Social History:  reports that she has been smoking cigarettes and  e-cigarettes. She has never used smokeless tobacco. She reports that she does not drink alcohol and does not use drugs.  Allergies:  Allergies  Allergen Reactions   Diphenhydramine Hcl Other (See Comments)   Other    Ketorolac Other (See Comments)    Stomach pains     Medications: I have reviewed the patient's current medications.  The PMH, PSH, Medications, Allergies, and SH were reviewed and updated.  ROS: Constitutional: Negative for fever, weight loss and weight gain. Cardiovascular: Negative for chest pain and dyspnea on exertion. Respiratory: Is not experiencing shortness of breath at rest. Gastrointestinal: Negative for nausea and vomiting. Neurological: Negative for headaches. Psychiatric: The patient is not nervous/anxious  Blood pressure 131/83, pulse 87, height 5' (1.524 m), weight 115 lb (52.2 kg), last menstrual period 05/26/2023, SpO2 100%. Body mass index is 22.46 kg/m.  PHYSICAL EXAM:  Exam: General: Well-developed, well-nourished Respiratory Respiratory effort: Equal inspiration and expiration without stridor Cardiovascular Peripheral Vascular: Warm extremities with equal color/perfusion Eyes: No nystagmus with equal extraocular motion bilaterally Neuro/Psych/Balance: Patient oriented to person, place, and time; Appropriate mood and affect; Gait is intact with no imbalance; Cranial nerves I-XII are intact Head and Face Inspection: Normocephalic and atraumatic without mass or lesion Palpation: Facial skeleton intact without bony stepoffs Salivary Glands: No mass or tenderness Facial Strength: Facial motility symmetric and full bilaterally ENT Pinna: External ear intact and fully developed External canal: Canal is patent with intact skin Tympanic Membrane: Clear and mobile External Nose: No scar or anatomic deformity Internal Nose: Septum is S-shaped with significant narrowing of the L > R nasal passages. No polyp, or purulence. Mucosal edema and erythema  present.  Bilateral inferior turbinate hypertrophy.  Lips, Teeth, and gums: Mucosa and teeth intact and viable TMJ: No pain to palpation with full mobility Oral cavity/oropharynx: No erythema or exudate, no lesions present Nasopharynx: No mass or lesion with intact mucosa Neck Neck and Trachea: Midline trachea without mass or lesion Thyroid: No mass or nodularity Lymphatics: No lymphadenopathy  Procedure:   PROCEDURE NOTE: nasal endoscopy  Preoperative diagnosis: chronic nasal congestion and obstruction symptoms  Postoperative diagnosis: same  Procedure: Diagnostic nasal endoscopy (16109)  Surgeon: Ashok Croon, M.D.  Anesthesia: Topical lidocaine and Afrin  H&P REVIEW: The patient's history and physical were reviewed today prior to procedure. All medications were reviewed and updated as well. Complications: None Condition is stable throughout exam Indications and consent: The patient presents with symptoms of chronic sinusitis not responding to previous therapies. All the risks, benefits, and potential complications were reviewed with the patient preoperatively and informed consent was obtained. The time out was completed with confirmation of the correct procedure.   Procedure: The patient was seated upright in the clinic. Topical lidocaine and Afrin were applied to the nasal cavity. After adequate anesthesia had occurred, the rigid nasal endoscope was passed into the nasal cavity. The nasal mucosa, turbinates, septum, and sinus drainage pathways were  visualized bilaterally. This revealed no purulence or significant secretions that might be cultured. There were no polyps or sites of significant inflammation. The mucosa was intact and there was no crusting present. The scope was then slowly withdrawn and the patient tolerated the procedure well. There were no complications or blood loss.   Studies Reviewed: CT max/face 06/16/23 FINDINGS: Osseous: No facial or mandibular  fracture.   Orbits: The globes are intact. Normal appearance of the intra- and extraconal fat. Symmetric extraocular muscles.   Sinuses: No fluid levels or advanced mucosal thickening.   Soft tissues: Normal visualized extracranial soft tissues.   Limited intracranial: Normal.   Other: None.   IMPRESSION: No facial or mandibular fracture.      Assessment/Plan: Encounter Diagnoses  Name Primary?   Tobacco use disorder Yes   Nasal septal deviation    Hypertrophy of both inferior nasal turbinates    Chronic nasal congestion    Nasal obstruction    Environmental and seasonal allergies    Facial injury, initial encounter     Assessment and Plan    Chronic nasal congestion and nasal obstruction with evidence of septal deviation/turbinate hypertrophy on exam (including nasal endoscopy) Nasal obstruction and difficulty breathing through the nose, exacerbated by recent facial trauma. History of multiple facial traumas, including a broken nose in her teenage years and a recent fall resulting in significant epistaxis. CT scan showed no fractures and no evidence of chronic sinusitis, but there is a septal deviation/ITH and nasal mucosa/ edema, likely due to allergies and smoking hx. We discussed starting medical management first and lifestyle changes before considering surgical intervention. Septoplasty is considered if symptoms do not improve with medical management. - Start Flonase nasal spray twice daily - Prescribe Zyrtec 10 mg to be taken at night - Recommend nasal saline rinses using a NeilMed bottle  - Provide resources for smoking cessation - Schedule follow-up in 3 months to reassess symptoms and consider septoplasty/ITR if no improvement  Epistaxis - resolved Significant epistaxis following a recent fall, which has since resolved.  - monitor   Environmental allergies  Allergic rhinitis with known allergies to dog dander and other allergens. Experiences nasal congestion  when exposed to these allergens, particularly at her mother's house where there are dogs and cats. Not currently on any allergy medications. - Prescribe Zyrtec 10 mg to be taken at night - Start Flonase 2 puffs b/l nares twice daily  Smoking/Tobacco Use d/o Smokes approximately one pack of cigarettes per day. Smoking contributes to nasal congestion and overall respiratory health issues. Previously quit smoking for a year but has since resumed. Smoking cessation is advised to improve nasal congestion and overall health. 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 No intervention has been done due to her pregnancy and subsequent lack of primary care follow-up. Requires further evaluation and management. - Recommend establishing care with a primary care physician for further evaluation and management of the thymic remnant - she is in the process of scheduling an appt now      Thank you for allowing me to participate in the care of this patient. Please do not hesitate to contact me with any questions or concerns.   Ashok Croon, MD Otolaryngology [facility] Phone: [phone] Fax: [phone]    06/26/2023, 5:01 PM

## 2023-06-26 NOTE — Patient Instructions (Signed)
 Dear Ann Brown,   Congratulations for your interest in quitting smoking!  Find a program that suits you best: when you want to quit, how you need support, where you live, and how you like to learn.    If you're ready to get started TODAY, consider scheduling a visit through [email or web address].  Appointments are available from 8am to 8pm, Monday to Friday.   Most health insurance plans will cover some level of tobacco cessation visits and medications.    Additional Resources: [employer] are also available to help you quit & provide the support you'll need. Many programs are available in both [place] and Spanish and have a long history of successfully helping people get off and stay off tobacco.    Quit Smoking Apps:  quitSTART at [employer].de QuitGuide?at [employer].dk Online education and resources: Smokefree  at [employer].gov Free Telephone Coaching: QuitNow,  Call 1-800-QUIT-NOW (262-014-3611) or Text- Ready to 6478062319 *Quitline [place] has teamed up with Medicaid to offer a free 14 week program    Vaping- Want to Quit? Free 24/7 support. Call [facility]  [place], [place], [place], [place], [place]  [place]   [employer] Med Nasal Saline Rinse   - start nasal saline rinses with NeilMed Bottle available over the counter or online to help with nasal congestion

## 2023-07-31 ENCOUNTER — Encounter: Payer: Self-pay | Admitting: Family

## 2023-07-31 ENCOUNTER — Ambulatory Visit (INDEPENDENT_AMBULATORY_CARE_PROVIDER_SITE_OTHER): Admitting: Family

## 2023-07-31 VITALS — BP 114/78 | HR 87 | Temp 97.9°F | Resp 16 | Ht 60.0 in | Wt 117.2 lb

## 2023-07-31 DIAGNOSIS — J45909 Unspecified asthma, uncomplicated: Secondary | ICD-10-CM

## 2023-07-31 DIAGNOSIS — Z130 Encounter for screening for diseases of the blood and blood-forming organs and certain disorders involving the immune mechanism: Secondary | ICD-10-CM | POA: Diagnosis not present

## 2023-07-31 MED ORDER — BREZTRI AEROSPHERE 160-9-4.8 MCG/ACT IN AERO
1.0000 | INHALATION_SPRAY | Freq: Two times a day (BID) | RESPIRATORY_TRACT | 6 refills | Status: AC
Start: 2023-07-31 — End: ?

## 2023-07-31 NOTE — Progress Notes (Signed)
 Went to the emergency room following up on being anemia and Lactic Acid was high. Pass out and broke her nose. She has seen ENT for broke nose.  Patient balance has been off since she passed out

## 2023-07-31 NOTE — Progress Notes (Signed)
 Patient ID: Ann Brown, female    DOB: [birth year]  MRN: [id number]  CC: Chronic Conditions Follow-Up  Subjective: Ann Brown is a 38 y.o. female who presents for chronic conditions follow-up.  Her concerns today include:   06/16/2023 [facility] Emergency Department at [facility] per DO note:    ED Course/ Medical Decision Making/ A&P                               Medical Decision Making Amount and/or Complexity of Data Reviewed Radiology: ordered.   Risk Prescription drug management.     Patient presents to the ED for concern of nose and right little finger pain following fall, this involves an extensive number of treatment options, and is a complaint that carries with it a high risk of complications and morbidity.  The differential diagnosis includes fracture, contusion, dislocation, sprain, septal hematoma, facial fracture     Co morbidities that complicate the patient evaluation   Past medical history of broken nose Dental abnormalities     Additional history obtained:   Additional history obtained from Nursing    External records from outside source obtained and reviewed including triage RN note       Imaging Studies ordered:   I ordered imaging studies including XR, CT maxillofacial  I independently visualized and interpreted imaging which showed no fracture, dislocation, nor septal hematoma I agree with the radiologist interpretation       Medicines ordered and prescription drug management:   I ordered medication including naprosyn  for pain  Reevaluation of the patient after these medicines showed that the patient improved I have reviewed the patients home medicines and have made adjustments as needed     Test Considered:   CT head Neurologically intact, well-appearing, no LOC nor blood thinners.  Low suspicion for ICH otherwise healthy adult         Problem List / ED Course:   Fall X-ray of right little finger as well  as CT maxillofacial are negative for fracture, septal hematoma No other injuries found on exam.  Physical exam is not remarkable for alarm signs Will have patient follow-up with ENT as needed as she notes that she has recurrent sinusitis and has had a nasal fracture in the past Discussed ED workup, disposition, return to emergency department cautions with patient expressed understand agrees with plan.  All questions answered to her satisfaction.  She is agreeable to plan.  Discharge instructions provided on paperwork     Reevaluation:   After the interventions noted above, I reevaluated the patient and found that they have :improved     Social Determinants of Health:   No PCP on record.  Provided [facility]     Dispostion:   After consideration of the diagnostic results and the patients response to treatment, I feel that the patent would benefit from outpatient management with symptomatic care.    Dr. Lockie Mola individually assessed patient, reviewed ED workup and agrees with plan  Follow-Ups  Follow up with [facility]; For primary care provider establishment, routine medical complaints, annual visits Follow up with Ashok Croon, MD (Otolaryngology); ENT f/u, As needed  Today's office visit 07/31/2023: - Reports feeling improved since Emergency Department discharge. Denies red flag symptoms. Doing well on medication regimen, no issues/concerns. States established with ENT. - States established with a specialist for thymus issues.  -  Asthma. States Pulmonology will not refill Breztri and told her to come to Primary Care for refills. Reports doing well on Breztri, no issues/concerns. Denies red flag symptoms associated with asthma. - Anemia screening.  - Most recent lactic acid normal at 0.7 on 06/16/2023.  - No further issues/concerns for discussion today.   Patient Active Problem List   Diagnosis Date Noted   S/P  emergency cesarean section 02/25/2021   Tooth pain 01/20/2021   Mass of mediastinum 06/30/2019   Abnormal mammogram 01/11/2017   Mild persistent asthma without complication 08/17/2016   Chronic lower back pain 09/24/2014   History of thyroid disease 02/24/2013   TOBACCO USER 04/25/2009     Current Outpatient Medications on File Prior to Visit  Medication Sig Dispense Refill   acetaminophen (TYLENOL) 325 MG tablet Take 2 tablets (650 mg total) by mouth every 6 (six) hours as needed for moderate pain. 60 tablet 0   albuterol (PROVENTIL) (2.5 MG/3ML) 0.083% nebulizer solution Take 3 mLs (2.5 mg total) by nebulization every 6 (six) hours as needed for wheezing or shortness of breath. 75 mL 0   albuterol (VENTOLIN HFA) 108 (90 Base) MCG/ACT inhaler Inhale 1-2 puffs into the lungs every 6 (six) hours as needed for wheezing or shortness of breath. 18 g 0   amoxicillin-clavulanate (AUGMENTIN) 875-125 MG tablet Take 1 tablet by mouth 2 (two) times daily. 20 tablet 0   amphetamine-dextroamphetamine (ADDERALL) 20 MG tablet Take 20 mg by mouth 2 (two) times daily.     buPROPion (WELLBUTRIN SR) 100 MG 12 hr tablet take 1 tablet by oral route every morning     busPIRone (BUSPAR) 5 MG tablet Take 5 mg by mouth 2 (two) times daily as needed.     cetirizine (ZYRTEC) 10 MG tablet Take 1 tablet (10 mg total) by mouth daily. 30 tablet 11   chlorhexidine (PERIDEX) 0.12 % solution Use as directed 10 mLs in the mouth or throat 2 (two) times daily. 120 mL 0   esomeprazole (NEXIUM) 40 MG capsule Take 1 capsule (40 mg total) by mouth 2 (two) times daily before a meal. 60 capsule 5   fluticasone (FLONASE) 50 MCG/ACT nasal spray Place 2 sprays into both nostrils daily. 16 g 6   hydrOXYzine (ATARAX/VISTARIL) 10 MG tablet Take 10 mg by mouth at bedtime as needed.     ibuprofen (ADVIL) 600 MG tablet Take 1 tablet (600 mg total) by mouth every 8 (eight) hours as needed. 30 tablet 0   ibuprofen (ADVIL) 800 MG tablet Take 1  tablet (800 mg total) by mouth every 8 (eight) hours as needed. 30 tablet 0   lidocaine (LIDODERM) 5 % Place 1 patch onto the skin daily. Remove & Discard patch within 12 hours or as directed by MD 30 patch 0   lidocaine (LIDODERM) 5 % Place 1 patch onto the skin daily. Remove & Discard patch within 12 hours or as directed by MD 30 patch 0   methocarbamol (ROBAXIN) 500 MG tablet Take 1 tablet (500 mg total) by mouth every 6 (six) hours as needed for muscle spasms. 30 tablet 0   Prenatal Vit-Fe Fumarate-FA (PREPLUS) 27-1 MG TABS Take 1 tablet by mouth daily. 30 tablet 8   silver sulfADIAZINE (SILVADENE) 1 % cream Apply 1 application. topically daily. 50 g 0   trimethoprim-polymyxin b (POLYTRIM) ophthalmic solution Place 1 drop into the right eye every 6 (six) hours. 10 mL 0   [DISCONTINUED] Fluticasone-Salmeterol (ADVAIR) 100-50 MCG/DOSE AEPB Inhale 1  puff into the lungs 2 (two) times daily. 60 each 5   No current facility-administered medications on file prior to visit.    Allergies  Allergen Reactions   Diphenhydramine Hcl Other (See Comments)   Other    Ketorolac Other (See Comments)    Stomach pains     Social History   Socioeconomic History   Marital status: Single    Spouse name: Not on file   Number of children: Not on file   Years of education: Not on file   Highest education level: Not on file  Occupational History   Not on file  Tobacco Use   Smoking status: Every Day    Current packs/day: 1.00    Types: Cigarettes, E-cigarettes   Smokeless tobacco: Never   Tobacco comments:    Also uses ecigarette  Vaping Use   Vaping status: Never Used  Substance and Sexual Activity   Alcohol use: No    Alcohol/week: 0.0 standard drinks of alcohol   Drug use: No   Sexual activity: Yes    Birth control/protection: None  Other Topics Concern   Not on file  Social History Narrative   ** Merged History Encounter **       Social Drivers of Health   Financial Resource  Strain: Low Risk  (07/31/2023)   Overall Financial Resource Strain (CARDIA)    Difficulty of Paying Living Expenses: Not hard at all  Food Insecurity: No Food Insecurity (07/31/2023)   Hunger Vital Sign    Worried About Running Out of Food in the Last Year: Never true    Ran Out of Food in the Last Year: Never true  Transportation Needs: No Transportation Needs (07/31/2023)   PRAPARE - Administrator, Civil Service (Medical): No    Lack of Transportation (Non-Medical): No  Physical Activity: Sufficiently Active (07/31/2023)   Exercise Vital Sign    Days of Exercise per Week: 5 days    Minutes of Exercise per Session: 40 min  Stress: No Stress Concern Present (07/31/2023)   [employer] of Occupational Health - Occupational Stress Questionnaire    Feeling of Stress : Only a little  Social Connections: Socially Isolated (07/31/2023)   Social Connection and Isolation Panel [NHANES]    Frequency of Communication with Friends and Family: Twice a week    Frequency of Social Gatherings with Friends and Family: Never    Attends Religious Services: Never    Database administrator or Organizations: No    Attends Banker Meetings: Never    Marital Status: Never married  Intimate Partner Violence: Not At Risk (07/31/2023)   Humiliation, Afraid, Rape, and Kick questionnaire    Fear of Current or Ex-Partner: No    Emotionally Abused: No    Physically Abused: No    Sexually Abused: No    Family History  Problem Relation Age of Onset   Multiple sclerosis Mother    Cervical cancer Mother    Breast cancer Maternal Grandmother        unsure of age    Past Surgical History:  Procedure Laterality Date   BOWEL RESECTION     CESAREAN SECTION N/A 02/25/2021   Procedure: CESAREAN SECTION;  Surgeon: Myna Hidalgo, DO;  Location: [facility] LD [facility];  Service: Obstetrics;  Laterality: N/A;   CHOLECYSTECTOMY     Complex trunk closure  2005   CSF SHUNT  1987   TRACHEOSTOMY  1987   TUBAL  LIGATION Bilateral 02/25/2021  Procedure: BILATERAL TUBAL LIGATION;  Surgeon: Myna Hidalgo, DO;  Location: [facility] ORS;  Service: Obstetrics;  Laterality: Bilateral;    ROS: Review of Systems Negative except as stated above  PHYSICAL EXAM: BP 114/78   Pulse 87   Temp 97.9 F (36.6 C) (Oral)   Resp 16   Ht 5' (1.524 m)   Wt 117 lb 3.2 oz (53.2 kg)   SpO2 97%   BMI 22.89 kg/m   Physical Exam HENT:     Head: Normocephalic and atraumatic.     Nose: Nose normal.     Mouth/Throat:     Mouth: Mucous membranes are moist.     Pharynx: Oropharynx is clear.  Eyes:     Extraocular Movements: Extraocular movements intact.     Conjunctiva/sclera: Conjunctivae normal.     Pupils: Pupils are equal, round, and reactive to light.  Cardiovascular:     Rate and Rhythm: Normal rate and regular rhythm.     Pulses: Normal pulses.     Heart sounds: Normal heart sounds.  Pulmonary:     Effort: Pulmonary effort is normal.     Breath sounds: Normal breath sounds.  Musculoskeletal:        General: Normal range of motion.     Cervical back: Normal range of motion and neck supple.  Neurological:     General: No focal deficit present.     Mental Status: She is alert and oriented to person, place, and time.  Psychiatric:        Mood and Affect: Affect is angry.        Behavior: Behavior is agitated and aggressive.    ASSESSMENT AND PLAN: 1. Asthma, unspecified asthma severity, unspecified whether complicated, unspecified whether persistent (Primary) - Continue Budeson-Glycopyrrolate-Formoterol as prescribed. Counseled on medication adherence/adverse effects. - Keep all scheduled appointments with Pulmonology.  - budeson-glycopyrrolate-formoterol (BREZTRI AEROSPHERE) 160-9-4.8 MCG/ACT AERO inhaler; Inhale 1-2 puffs into the lungs in the morning and at bedtime.  Dispense: 10.7 g; Refill: 6  2. Screening for deficiency anemia - Routine screening.  - CBC    Patient was given the opportunity  to ask questions.  Patient verbalized understanding of the plan and was able to repeat key elements of the plan. Patient was given clear instructions to go to Emergency Department or return to medical center if symptoms don't improve, worsen, or new problems develop.The patient verbalized understanding.   Orders Placed This Encounter  Procedures   CBC     Requested Prescriptions   Signed Prescriptions Disp Refills   budeson-glycopyrrolate-formoterol (BREZTRI AEROSPHERE) 160-9-4.8 MCG/ACT AERO inhaler 10.7 g 6    Sig: Inhale 1-2 puffs into the lungs in the morning and at bedtime.    Return in about 4 weeks (around 08/28/2023) for Follow-Up or next available with Georganna Skeans, MD.  Rema Fendt, NP

## 2023-08-01 ENCOUNTER — Encounter: Payer: Self-pay | Admitting: Family

## 2023-08-01 LAB — CBC
Hematocrit: 36.4 % (ref 34.0–46.6)
Hemoglobin: 11.4 g/dL (ref 11.1–15.9)
MCH: 29.3 pg (ref 26.6–33.0)
MCHC: 31.3 g/dL — ABNORMAL LOW (ref 31.5–35.7)
MCV: 94 fL (ref 79–97)
Platelets: 337 10*3/uL (ref 150–450)
RBC: 3.89 x10E6/uL (ref 3.77–5.28)
RDW: 17.2 % — ABNORMAL HIGH (ref 11.7–15.4)
WBC: 10.4 10*3/uL (ref 3.4–10.8)

## 2023-08-01 NOTE — Progress Notes (Signed)
 I called and made patient aware of results and NP recommendations

## 2023-09-02 ENCOUNTER — Ambulatory Visit (INDEPENDENT_AMBULATORY_CARE_PROVIDER_SITE_OTHER)

## 2023-09-02 ENCOUNTER — Encounter: Payer: Self-pay | Admitting: Family

## 2023-09-02 ENCOUNTER — Ambulatory Visit (INDEPENDENT_AMBULATORY_CARE_PROVIDER_SITE_OTHER): Admitting: Family

## 2023-09-02 VITALS — BP 105/71 | HR 86 | Temp 98.0°F | Resp 16 | Ht 60.0 in | Wt 116.8 lb

## 2023-09-02 DIAGNOSIS — Z8739 Personal history of other diseases of the musculoskeletal system and connective tissue: Secondary | ICD-10-CM | POA: Diagnosis not present

## 2023-09-02 DIAGNOSIS — K219 Gastro-esophageal reflux disease without esophagitis: Secondary | ICD-10-CM

## 2023-09-02 DIAGNOSIS — M25552 Pain in left hip: Secondary | ICD-10-CM | POA: Diagnosis not present

## 2023-09-02 DIAGNOSIS — M545 Low back pain, unspecified: Secondary | ICD-10-CM

## 2023-09-02 DIAGNOSIS — Z130 Encounter for screening for diseases of the blood and blood-forming organs and certain disorders involving the immune mechanism: Secondary | ICD-10-CM

## 2023-09-02 MED ORDER — ESOMEPRAZOLE MAGNESIUM 40 MG PO CPDR: 40.0000 mg | DELAYED_RELEASE_CAPSULE | Freq: Two times a day (BID) | ORAL | 0 refills | Status: AC

## 2023-09-02 MED ORDER — TRIAMCINOLONE ACETONIDE 40 MG/ML IJ SUSP
40.0000 mg | Freq: Once | INTRAMUSCULAR | Status: AC
  Administered 2023-09-03: 40 mg via INTRAMUSCULAR

## 2023-09-02 NOTE — Progress Notes (Signed)
 4 week follow up,  hip and lower back hurting since she fell, medication refill

## 2023-09-02 NOTE — Progress Notes (Signed)
 Patient ID: Ann Brown, female    DOB: [birth year]  MRN: [id number]  CC: Back Pain  Subjective: Ann Brown is a 38 y.o. female who presents for back pain.  Her concerns today include:  - Left hip pain radiating to lower back for several weeks. Denies recent trauma/injury and red flag symptoms. Reports history of right hip bone spur and degenerative disc disease. Reports in the past she was established with Orthopedics. - Doing well on Nexium , no issues/concerns.  - Anemia follow-up.  Patient Active Problem List   Diagnosis Date Noted   S/P emergency cesarean section 02/25/2021   Tooth pain 01/20/2021   Mass of mediastinum 06/30/2019   Abnormal mammogram 01/11/2017   Mild persistent asthma without complication 08/17/2016   Chronic lower back pain 09/24/2014   History of thyroid  disease 02/24/2013   TOBACCO USER 04/25/2009     Current Outpatient Medications on File Prior to Visit  Medication Sig Dispense Refill   acetaminophen  (TYLENOL ) 325 MG tablet Take 2 tablets (650 mg total) by mouth every 6 (six) hours as needed for moderate pain. 60 tablet 0   albuterol  (PROVENTIL ) (2.5 MG/3ML) 0.083% nebulizer solution Take 3 mLs (2.5 mg total) by nebulization every 6 (six) hours as needed for wheezing or shortness of breath. 75 mL 0   amphetamine -dextroamphetamine  (ADDERALL) 20 MG tablet Take 20 mg by mouth 2 (two) times daily.     budeson-glycopyrrolate-formoterol  (BREZTRI  AEROSPHERE) 160-9-4.8 MCG/ACT AERO inhaler Inhale 1-2 puffs into the lungs in the morning and at bedtime. 10.7 g 6   buPROPion  (WELLBUTRIN  SR) 100 MG 12 hr tablet take 1 tablet by oral route every morning     busPIRone  (BUSPAR ) 5 MG tablet Take 5 mg by mouth 2 (two) times daily as needed.     cetirizine  (ZYRTEC ) 10 MG tablet Take 1 tablet (10 mg total) by mouth daily. 30 tablet 11   fluticasone  (FLONASE ) 50 MCG/ACT nasal spray Place 2 sprays into both nostrils daily. 16 g 6   albuterol  (VENTOLIN  HFA) 108 (90  Base) MCG/ACT inhaler Inhale 1-2 puffs into the lungs every 6 (six) hours as needed for wheezing or shortness of breath. (Patient not taking: Reported on 09/02/2023) 18 g 0   amoxicillin -clavulanate (AUGMENTIN ) 875-125 MG tablet Take 1 tablet by mouth 2 (two) times daily. (Patient not taking: Reported on 09/02/2023) 20 tablet 0   chlorhexidine  (PERIDEX ) 0.12 % solution Use as directed 10 mLs in the mouth or throat 2 (two) times daily. (Patient not taking: Reported on 09/02/2023) 120 mL 0   hydrOXYzine (ATARAX/VISTARIL) 10 MG tablet Take 10 mg by mouth at bedtime as needed. (Patient not taking: Reported on 09/02/2023)     ibuprofen  (ADVIL ) 600 MG tablet Take 1 tablet (600 mg total) by mouth every 8 (eight) hours as needed. (Patient not taking: Reported on 09/02/2023) 30 tablet 0   ibuprofen  (ADVIL ) 800 MG tablet Take 1 tablet (800 mg total) by mouth every 8 (eight) hours as needed. (Patient not taking: Reported on 09/02/2023) 30 tablet 0   lidocaine  (LIDODERM ) 5 % Place 1 patch onto the skin daily. Remove & Discard patch within 12 hours or as directed by MD (Patient not taking: Reported on 09/02/2023) 30 patch 0   lidocaine  (LIDODERM ) 5 % Place 1 patch onto the skin daily. Remove & Discard patch within 12 hours or as directed by MD (Patient not taking: Reported on 09/02/2023) 30 patch 0   methocarbamol  (ROBAXIN ) 500 MG tablet Take 1 tablet (500  mg total) by mouth every 6 (six) hours as needed for muscle spasms. (Patient not taking: Reported on 09/02/2023) 30 tablet 0   Prenatal Vit-Fe Fumarate-FA (PREPLUS) 27-1 MG TABS Take 1 tablet by mouth daily. (Patient not taking: Reported on 09/02/2023) 30 tablet 8   silver  sulfADIAZINE  (SILVADENE ) 1 % cream Apply 1 application. topically daily. (Patient not taking: Reported on 09/02/2023) 50 g 0   trimethoprim -polymyxin b  (POLYTRIM ) ophthalmic solution Place 1 drop into the right eye every 6 (six) hours. (Patient not taking: Reported on 09/02/2023) 10 mL 0   [DISCONTINUED]  Fluticasone -Salmeterol (ADVAIR) 100-50 MCG/DOSE AEPB Inhale 1 puff into the lungs 2 (two) times daily. 60 each 5   No current facility-administered medications on file prior to visit.    Allergies  Allergen Reactions   Diphenhydramine  Hcl Other (See Comments)   Other    Ketorolac  Other (See Comments)    Stomach pains     Social History   Socioeconomic History   Marital status: Single    Spouse name: Not on file   Number of children: Not on file   Years of education: Not on file   Highest education level: Not on file  Occupational History   Not on file  Tobacco Use   Smoking status: Every Day    Current packs/day: 1.00    Types: Cigarettes, E-cigarettes   Smokeless tobacco: Never   Tobacco comments:    Also uses ecigarette  Vaping Use   Vaping status: Never Used  Substance and Sexual Activity   Alcohol use: No    Alcohol/week: 0.0 standard drinks of alcohol   Drug use: No   Sexual activity: Yes    Birth control/protection: None  Other Topics Concern   Not on file  Social History Narrative   ** Merged History Encounter **       Social Drivers of Health   Financial Resource Strain: Low Risk  (07/31/2023)   Overall Financial Resource Strain (CARDIA)    Difficulty of Paying Living Expenses: Not hard at all  Food Insecurity: No Food Insecurity (07/31/2023)   Hunger Vital Sign    Worried About Running Out of Food in the Last Year: Never true    Ran Out of Food in the Last Year: Never true  Transportation Needs: No Transportation Needs (07/31/2023)   PRAPARE - Administrator, Civil Service (Medical): No    Lack of Transportation (Non-Medical): No  Physical Activity: Sufficiently Active (07/31/2023)   Exercise Vital Sign    Days of Exercise per Week: 5 days    Minutes of Exercise per Session: 40 min  Stress: No Stress Concern Present (07/31/2023)   [employer] of Occupational Health - Occupational Stress Questionnaire    Feeling of Stress : Only a little   Social Connections: Socially Isolated (07/31/2023)   Social Connection and Isolation Panel [NHANES]    Frequency of Communication with Friends and Family: Twice a week    Frequency of Social Gatherings with Friends and Family: Never    Attends Religious Services: Never    Database administrator or Organizations: No    Attends Banker Meetings: Never    Marital Status: Never married  Intimate Partner Violence: Not At Risk (07/31/2023)   Humiliation, Afraid, Rape, and Kick questionnaire    Fear of Current or Ex-Partner: No    Emotionally Abused: No    Physically Abused: No    Sexually Abused: No    Family History  Problem  Relation Age of Onset   Multiple sclerosis Mother    Cervical cancer Mother    Breast cancer Maternal Grandmother        unsure of age    Past Surgical History:  Procedure Laterality Date   BOWEL RESECTION     CESAREAN SECTION N/A 02/25/2021   Procedure: CESAREAN SECTION;  Surgeon: Ozan, Jennifer, DO;  Location: [facility];  Service: Obstetrics;  Laterality: N/A;   CHOLECYSTECTOMY     Complex trunk closure  2005   CSF SHUNT  1987   TRACHEOSTOMY  1987   TUBAL LIGATION Bilateral 02/25/2021   Procedure: BILATERAL TUBAL LIGATION;  Surgeon: Ozan, Jennifer, DO;  Location: [facility];  Service: Obstetrics;  Laterality: Bilateral;    ROS: Review of Systems Negative except as stated above  PHYSICAL EXAM: BP 105/71   Pulse 86   Temp 98 F (36.7 C) (Oral)   Resp 16   Ht 5' (1.524 m)   Wt 116 lb 12.8 oz (53 kg)   LMP 07/29/2023 (Approximate)   SpO2 98%   BMI 22.81 kg/m   Physical Exam HENT:     Head: Normocephalic and atraumatic.     Nose: Nose normal.     Mouth/Throat:     Mouth: Mucous membranes are moist.     Pharynx: Oropharynx is clear.  Eyes:     Extraocular Movements: Extraocular movements intact.     Conjunctiva/sclera: Conjunctivae normal.     Pupils: Pupils are equal, round, and reactive to light.  Cardiovascular:     Rate and  Rhythm: Normal rate and regular rhythm.     Pulses: Normal pulses.     Heart sounds: Normal heart sounds.  Pulmonary:     Effort: Pulmonary effort is normal.     Breath sounds: Normal breath sounds.  Musculoskeletal:        General: Normal range of motion.     Right shoulder: Normal.     Left shoulder: Normal.     Right upper arm: Normal.     Left upper arm: Normal.     Right elbow: Normal.     Left elbow: Normal.     Right forearm: Normal.     Left forearm: Normal.     Right wrist: Normal.     Left wrist: Normal.     Right hand: Normal.     Left hand: Normal.     Cervical back: Normal, normal range of motion and neck supple.     Thoracic back: Normal.     Lumbar back: Normal.     Right hip: Normal.     Left hip: Normal.     Right upper leg: Normal.     Left upper leg: Normal.     Right knee: Normal.     Left knee: Normal.     Right lower leg: Normal.     Left lower leg: Normal.     Right ankle: Normal.     Left ankle: Normal.     Right foot: Normal.     Left foot: Normal.  Neurological:     General: No focal deficit present.     Mental Status: She is alert and oriented to person, place, and time.  Psychiatric:        Mood and Affect: Mood normal.        Behavior: Behavior normal.      ASSESSMENT AND PLAN: 1. Left hip pain (Primary) - Diagnostic hip left for evaluation.  -  Triamcinolone Acetonide administered in office.  - Referral to Orthopedic Surgery for evaluation/management.  - Follow-up with primary provider as scheduled. - DG Hip Unilat W OR W/O Pelvis Min 4 Views Left; Future - triamcinolone acetonide (KENALOG-40) injection 40 mg - Ambulatory referral to Orthopedic Surgery  2. Low back pain, unspecified back pain laterality, unspecified chronicity, unspecified whether sciatica present - Diagnostic lumbar spine for evaluation. - Triamcinolone Acetonide administered in office.  - Referral to Orthopedic Surgery for evaluation/management.  - Follow-up  with primary provider as scheduled. - DG Lumbar Spine Complete; Future - triamcinolone acetonide (KENALOG-40) injection 40 mg - Ambulatory referral to Orthopedic Surgery  3. History of degenerative disc disease - Triamcinolone Acetonide administered in office.  - Referral to Orthopedic Surgery for evaluation/management.  - Follow-up with primary provider as scheduled. - triamcinolone acetonide (KENALOG-40) injection 40 mg - Ambulatory referral to Orthopedic Surgery  4. Gastroesophageal reflux disease, unspecified whether esophagitis present - Continue Esomeprazole  as prescribed. Counseled on medication adherence/adverse effects.  - Follow-up with primary provider as scheduled. - esomeprazole  (NEXIUM ) 40 MG capsule; Take 1 capsule (40 mg total) by mouth 2 (two) times daily before a meal.  Dispense: 180 capsule; Refill: 0  5. Screening for deficiency anemia - Routine screening.  - CBC    Patient was given the opportunity to ask questions.  Patient verbalized understanding of the plan and was able to repeat key elements of the plan. Patient was given clear instructions to go to Emergency Department or return to medical center if symptoms don't improve, worsen, or new problems develop.The patient verbalized understanding.   Orders Placed This Encounter  Procedures   DG Hip Unilat W OR W/O Pelvis Min 4 Views Left   DG Lumbar Spine Complete   CBC   Ambulatory referral to Orthopedic Surgery     Requested Prescriptions   Signed Prescriptions Disp Refills   esomeprazole  (NEXIUM ) 40 MG capsule 180 capsule 0    Sig: Take 1 capsule (40 mg total) by mouth 2 (two) times daily before a meal.    Follow-up with primary provider as scheduled.  Senaida Dama, NP

## 2023-09-03 ENCOUNTER — Ambulatory Visit: Payer: Self-pay | Admitting: Family

## 2023-09-03 DIAGNOSIS — M25552 Pain in left hip: Secondary | ICD-10-CM | POA: Diagnosis not present

## 2023-09-03 DIAGNOSIS — M545 Low back pain, unspecified: Secondary | ICD-10-CM | POA: Diagnosis not present

## 2023-09-03 DIAGNOSIS — Z8739 Personal history of other diseases of the musculoskeletal system and connective tissue: Secondary | ICD-10-CM | POA: Diagnosis not present

## 2023-09-04 LAB — CBC
Hematocrit: 36.1 % (ref 34.0–46.6)
Hemoglobin: 11.4 g/dL (ref 11.1–15.9)
MCH: 29.6 pg (ref 26.6–33.0)
MCHC: 31.6 g/dL (ref 31.5–35.7)
MCV: 94 fL (ref 79–97)
Platelets: 289 10*3/uL (ref 150–450)
RBC: 3.85 x10E6/uL (ref 3.77–5.28)
RDW: 16.1 % — ABNORMAL HIGH (ref 11.7–15.4)
WBC: 10.7 10*3/uL (ref 3.4–10.8)

## 2023-09-05 ENCOUNTER — Ambulatory Visit: Admitting: Physical Medicine and Rehabilitation

## 2023-09-05 DIAGNOSIS — M7918 Myalgia, other site: Secondary | ICD-10-CM | POA: Diagnosis not present

## 2023-09-05 DIAGNOSIS — M5416 Radiculopathy, lumbar region: Secondary | ICD-10-CM

## 2023-09-05 DIAGNOSIS — M5441 Lumbago with sciatica, right side: Secondary | ICD-10-CM

## 2023-09-05 DIAGNOSIS — M5442 Lumbago with sciatica, left side: Secondary | ICD-10-CM | POA: Diagnosis not present

## 2023-09-05 DIAGNOSIS — G8929 Other chronic pain: Secondary | ICD-10-CM

## 2023-09-05 NOTE — Progress Notes (Signed)
 Ann Brown - 38 y.o. female MRN [id number]  Date of birth: December 30, 1985  Office Visit Note: Visit Date: 09/05/2023 PCP: Senaida Dama, NP Referred by: Senaida Dama, NP  Subjective: Chief Complaint  Patient presents with   Lower Back - Pain   HPI: Ann Brown is a 38 y.o. female who comes in today per the request of Lavona Pounds, NP for evaluation of chronic, worsening and severe bilateral lower back pain radiating to hips. Numbness/tingling to bilateral legs. Pain ongoing for many years, started after injury on trampoline during teenage years. Also concerned her back pain is a result of epidural during childbirth. Her pain worsens with activity, movement and bending. She describes pain as sore and aching sensation, currently rates as 6 out of 10. Some relief of pain with home exercise regimen, rest and use of medications. No history of formal physical therapy. Recent lumbar radiographs show minimal degenerative joint changes of lumbar spine. No spondylolisthesis, no pars defects. No recent lumbar MRI imaging. No history of lumbar surgery/injections. 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 Patient denies focal weakness. Reports fall in February, she was evaluated in the ER.      Review of Systems  Musculoskeletal:  Positive for back pain and myalgias.  Neurological:  Negative for tingling, sensory change, focal weakness and weakness.  All other systems reviewed and are negative.  Otherwise per HPI.  Assessment & Plan: Visit Diagnoses:    ICD-10-CM   1. Chronic bilateral low back pain with bilateral sciatica  M54.42 MR LUMBAR SPINE WO CONTRAST   M54.41 Ambulatory referral to Physical Therapy   G89.29     2. Radiculopathy, lumbar region  M54.16 MR LUMBAR SPINE WO CONTRAST    Ambulatory referral to Physical Therapy    3. Myofascial pain syndrome  M79.18 MR LUMBAR SPINE WO CONTRAST    Ambulatory referral to Physical Therapy       Plan:  Findings:  Chronic, worsening and severe bilateral lower back pain radiating to hips. Paresthesias to bilateral legs. Patient continues to have severe pain despite good conservative therapies such as home exercise regimen, rest and use of medications.  Patient's clinical presentation and exam are complex, differentials include lumbar radiculopathy and myofascial pain syndrome.  Her pain pattern does seem to fit more of an L5 distribution.  She does have diffuse myofascial tenderness to bilateral thoracic and lumbar paraspinal regions upon palpation today.  We discussed treatment plan in detail today.  Next step is to obtain lumbar MRI imaging.  I also placed order for short course of formal physical therapy as I do feel she would benefit from manual treatments and core strengthening exercises.  I would like for her to work with PT to establish consistent home exercise regimen.  She has no questions at this time.  I will see her back for lumbar MRI review and to discuss treatment options.  I encouraged her to remain active as tolerated.  No red flag symptoms noted upon exam today.    Meds & Orders: No orders of the defined types were placed in this encounter.   Orders Placed This Encounter  Procedures   MR LUMBAR SPINE WO CONTRAST   Ambulatory referral to Physical Therapy    Follow-up: Return for Lumbar MRI review.   Procedures: No procedures performed      Clinical History: No specialty comments available.   She reports that she has been smoking cigarettes and e-cigarettes. She has  never used smokeless tobacco. No results for input(s): "HGBA1C", "LABURIC" in the last 8760 hours.  Objective:  VS:  HT:    WT:   BMI:     BP:   HR: bpm  TEMP: ( )  RESP:  Physical Exam Vitals and nursing note reviewed.  HENT:     Head: Normocephalic and atraumatic.     Right Ear: External ear normal.     Left Ear: External ear normal.     Nose: Nose normal.     Mouth/Throat:     Mouth: Mucous  membranes are moist.  Eyes:     Extraocular Movements: Extraocular movements intact.  Cardiovascular:     Rate and Rhythm: Normal rate.     Pulses: Normal pulses.  Pulmonary:     Effort: Pulmonary effort is normal.  Abdominal:     General: Abdomen is flat. There is no distension.  Musculoskeletal:        General: Tenderness present.     Cervical back: Normal range of motion.     Comments: Patient rises from seated position to standing without difficulty.  Mild pain noted with facet loading and lumbar extension.  5/5 strength noted with bilateral hip flexion, knee flexion/extension, ankle dorsiflexion/plantarflexion and EHL. No clonus noted bilaterally. No pain upon palpation of greater trochanters. No pain with internal/external rotation of bilateral hips. Sensation intact bilaterally.  Myofascial tenderness noted to bilateral thoracic and lumbar paraspinal regions.  Negative slump test bilaterally. Ambulates without aid, gait steady.     Skin:    General: Skin is warm and dry.     Capillary Refill: Capillary refill takes less than 2 seconds.  Neurological:     General: No focal deficit present.     Mental Status: She is alert and oriented to person, place, and time.  Psychiatric:        Mood and Affect: Mood normal.        Behavior: Behavior normal.     Ortho Exam  Imaging: No results found.  Past Medical/Family/Surgical/Social History: Medications & Allergies reviewed per EMR, new medications updated. 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   GERD (gastroesophageal reflux disease)    History of gestational diabetes 01/21/2013   Kidney problem    Kideney reflux   Suicidal ideation    Tobacco use    URI 03/01/2009   Family History  Problem Relation Age of Onset   Multiple sclerosis Mother    Cervical cancer Mother    Breast cancer Maternal Grandmother        unsure of age   Past Surgical History:  Procedure Laterality Date   BOWEL RESECTION     CESAREAN SECTION N/A 02/25/2021   Procedure: CESAREAN SECTION;  Surgeon: Ozan, Jennifer, DO;  Location: [facility];  Service: Obstetrics;  Laterality: N/A;   CHOLECYSTECTOMY     Complex trunk closure  2005   CSF SHUNT  1987  TRACHEOSTOMY  1987   TUBAL LIGATION Bilateral 02/25/2021   Procedure: BILATERAL TUBAL LIGATION;  Surgeon: Ozan, Jennifer, DO;  Location: [facility];  Service: Obstetrics;  Laterality: Bilateral;   Social History   Occupational History   Not on file  Tobacco Use   Smoking status: Every Day    Current packs/day: 1.00    Types: Cigarettes, E-cigarettes   Smokeless tobacco: Never   Tobacco comments:    Also uses ecigarette  Vaping Use   Vaping status: Never Used  Substance and Sexual Activity   Alcohol use: No    Alcohol/week: 0.0 standard drinks of alcohol   Drug use: No   Sexual activity: Yes    Birth control/protection: None

## 2023-09-05 NOTE — Progress Notes (Signed)
 Pain Scale   Average Pain 6 Patient advising she has Chronic back pain radiating bilaterally to both legs         +Driver, -BT, -Dye Allergies.

## 2023-09-05 NOTE — Progress Notes (Signed)
 Core Outcome Measures Index (COMI) Back Score  Average Pain 6  COMI Score 60 %

## 2023-09-06 ENCOUNTER — Encounter: Payer: Self-pay | Admitting: Physical Medicine and Rehabilitation

## 2023-09-09 ENCOUNTER — Encounter: Payer: Self-pay | Admitting: Physical Medicine and Rehabilitation

## 2023-09-15 ENCOUNTER — Other Ambulatory Visit

## 2023-09-17 ENCOUNTER — Ambulatory Visit

## 2023-09-17 NOTE — Therapy (Deleted)
 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   GERD (gastroesophageal reflux disease)    History of gestational diabetes 01/21/2013   Kidney problem    Kideney reflux   Suicidal ideation    Tobacco use    URI 03/01/2009   Past Surgical History:  Procedure Laterality Date   BOWEL RESECTION     CESAREAN SECTION N/A 02/25/2021   Procedure: CESAREAN SECTION;  Surgeon: Ozan, Jennifer, DO;  Location: [facility];  Service: Obstetrics;  Laterality: N/A;   CHOLECYSTECTOMY     Complex trunk closure  2005   CSF SHUNT  1987   TRACHEOSTOMY  1987   TUBAL LIGATION Bilateral 02/25/2021   Procedure: BILATERAL TUBAL LIGATION;  Surgeon: Ozan, Jennifer, DO;  Location: [facility];  Service: Obstetrics;  Laterality: Bilateral;   Patient Active Problem List   Diagnosis Date Noted   S/P emergency cesarean section 02/25/2021   Tooth pain 01/20/2021   Mass of mediastinum 06/30/2019   Abnormal mammogram 01/11/2017   Mild persistent asthma without complication 08/17/2016   Chronic lower back pain 09/24/2014   History of thyroid  disease 02/24/2013   TOBACCO USER 04/25/2009    PCP: Senaida Dama, NP   REFERRING PROVIDER: Darryll Eng, NP  REFERRING DIAG: M54.42,M54.41,G89.29 (ICD-10-CM) - Chronic bilateral low back pain with bilateral sciatica M54.16 (ICD-10-CM) - Radiculopathy, lumbar region M79.18 (ICD-10-CM) - Myofascial pain syndrome  Rationale for Evaluation and Treatment: Rehabilitation  THERAPY DIAG:  No diagnosis found.  ONSET DATE: chronic  SUBJECTIVE:                                                                                                                                                                                            SUBJECTIVE STATEMENT: HPI: Ann Brown is a 38 y.o. female who comes in today per the request of Lavona Pounds, NP for evaluation of chronic, worsening and severe bilateral lower back pain radiating to hips. Numbness/tingling to bilateral legs. Pain ongoing for many years, started after injury on trampoline during teenage years. Also concerned her back pain is a result of epidural during childbirth. Her pain worsens with activity, movement and bending. She describes pain as sore and aching sensation, currently rates as 6 out of 10. Some relief of pain  with home exercise regimen, rest and use of medications. No history of formal physical therapy. Recent lumbar radiographs show minimal degenerative joint changes of lumbar spine. No spondylolisthesis, no pars defects. No recent lumbar MRI imaging. No history of lumbar surgery/injections. 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 Patient denies focal weakness. Reports fall in February, she was evaluated in the ER.   PERTINENT HISTORY:  Chronic, worsening and severe bilateral lower back pain radiating to hips. Paresthesias to bilateral legs. Patient continues to have severe pain despite good conservative therapies such as home exercise regimen, rest and use of medications. Patient's clinical presentation and exam are complex, differentials include lumbar radiculopathy and myofascial pain syndrome. Her pain pattern does seem to fit more of an L5 distribution. She does have diffuse myofascial tenderness to bilateral thoracic and lumbar paraspinal regions upon palpation today. We discussed treatment plan in detail today. Next step is to obtain lumbar MRI imaging. I also placed order for short course of formal physical therapy as I do feel she would benefit from manual treatments and core strengthening exercises. I would like for her to work with PT to  establish consistent home exercise regimen. She has no questions at this time. I will see her back for lumbar MRI review and to discuss treatment options. I encouraged her to remain active as tolerated. No red flag symptoms noted upon exam today.  PAIN:  Are you having pain? Yes: NPRS scale: *** Pain location: *** Pain description: *** Aggravating factors: *** Relieving factors: ***  PRECAUTIONS: None  RED FLAGS: None   WEIGHT BEARING RESTRICTIONS: No  FALLS:  Has patient fallen in last 6 months? {fallsyesno:27318}  OCCUPATION: housekeeping  PLOF: Independent  PATIENT GOALS: ***  NEXT MD VISIT: TBD  OBJECTIVE:  Note: Objective measures were completed at Evaluation unless otherwise noted.  DIAGNOSTIC FINDINGS:  FINDINGS: There is no evidence of lumbar spine fracture. Alignment is normal. Minimal anterior spurring noted at L3 and L4. Intervertebral disc spaces are maintained.   IMPRESSION: Minimal degenerative joint changes of lumbar spine.     Electronically Signed   By: Anna Barnes M.D.   On: 09/03/2023 08:59  PATIENT SURVEYS:  Modified Oswestry ***   MUSCLE LENGTH: Hamstrings: Right *** deg; Left *** deg Andy Bannister test: Right *** deg; Left *** deg  POSTURE: {posture:25561}  PALPATION: ***  LUMBAR ROM:   AROM eval  Flexion   Extension   Right lateral flexion   Left lateral flexion   Right rotation   Left rotation    (Blank rows = not tested)  LOWER EXTREMITY ROM:     {AROM/PROM:27142}  Right eval Left eval  Hip flexion    Hip extension    Hip abduction    Hip adduction    Hip internal rotation    Hip external rotation    Knee flexion    Knee extension    Ankle dorsiflexion    Ankle plantarflexion    Ankle inversion    Ankle eversion     (Blank rows = not tested)  LOWER EXTREMITY MMT:    MMT Right eval Left eval  Hip flexion    Hip extension    Hip abduction    Hip adduction    Hip internal rotation    Hip external rotation     Knee flexion    Knee extension    Ankle dorsiflexion    Ankle plantarflexion    Ankle inversion    Ankle eversion     (  Blank rows = not tested)  LUMBAR SPECIAL TESTS:  Straight leg raise test: {pos/neg:25243}, Slump test: {pos/neg:25243}, FABER test: {pos/neg:25243}, and Thomas test: {pos/neg:25243}  FUNCTIONAL TESTS:  30 seconds chair stand test  GAIT: Distance walked: *** Assistive device utilized: {Assistive devices:23999} Level of assistance: {Levels of assistance:24026} Comments: ***  TREATMENT DATE:  [facility] Adult PT Treatment:                                                DATE: 09/17/23  Self Care: ***                                                                                                                                PATIENT EDUCATION:  Education details: Discussed eval findings, rehab rationale and POC and patient is in agreement  Person educated: {Person educated:25204} Education method: {Education Method:25205} Education comprehension: {Education Comprehension:25206}  HOME EXERCISE PROGRAM: ***  ASSESSMENT:  CLINICAL IMPRESSION: Patient is a *** y.o. *** who was seen today for physical therapy evaluation and treatment for ***.   OBJECTIVE IMPAIRMENTS: {opptimpairments:25111}.   ACTIVITY LIMITATIONS: {activitylimitations:27494}  PARTICIPATION LIMITATIONS: {participationrestrictions:25113}  PERSONAL FACTORS: {Personal factors:25162} are also affecting patient's functional outcome.   REHAB POTENTIAL: {rehabpotential:25112}  CLINICAL DECISION MAKING: {clinical decision making:25114}  EVALUATION COMPLEXITY: {Evaluation complexity:25115}   GOALS: Goals reviewed with patient? {yes/no:20286}  SHORT TERM GOALS: Target date: ***  *** Baseline: Goal status: INITIAL  2.  *** Baseline:  Goal status: INITIAL  3.  *** Baseline:  Goal status: INITIAL  4.  *** Baseline:  Goal status: INITIAL  5.  *** Baseline:  Goal status:  INITIAL  6.  *** Baseline:  Goal status: INITIAL  LONG TERM GOALS: Target date: ***  *** Baseline:  Goal status: INITIAL  2.  *** Baseline:  Goal status: INITIAL  3.  *** Baseline:  Goal status: INITIAL  4.  *** Baseline:  Goal status: INITIAL  5.  *** Baseline:  Goal status: INITIAL  6.  *** Baseline:  Goal status: INITIAL  PLAN:  PT FREQUENCY: {rehab frequency:25116}  PT DURATION: {rehab duration:25117}  PLANNED INTERVENTIONS: {rehab planned interventions:25118::"97110-Therapeutic exercises","97530- Therapeutic [phone]- Neuromuscular re-education","97535- Self JXBJ","47829- Manual therapy"}.  PLAN FOR NEXT SESSION: ***   Graysin Luczynski M Judye Lorino, PT 09/17/2023, 9:38 AM

## 2023-09-26 NOTE — Therapy (Unsigned)
 OUTPATIENT PHYSICAL THERAPY THORACOLUMBAR EVALUATION   Patient Name: Ann Brown MRN: [id number] DOB:1985-09-01, 38 y.o., female Today's Date: 09/27/2023  END OF SESSION:  PT End of Session - 09/27/23 1353     Visit Number 1    Number of Visits 12    Date for PT Re-Evaluation 11/27/23    Authorization Type MCD    PT Start Time 1350    PT Stop Time 1430    PT Time Calculation (min) 40 min    Activity Tolerance Patient tolerated treatment well             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   GERD (gastroesophageal reflux disease)    History of gestational diabetes 01/21/2013   Kidney problem    Kideney reflux   Suicidal ideation    Tobacco use    URI 03/01/2009   Past Surgical History:  Procedure Laterality Date   BOWEL RESECTION     CESAREAN SECTION N/A 02/25/2021   Procedure: CESAREAN SECTION;  Surgeon: Ozan, Jennifer, DO;  Location: [facility];  Service: Obstetrics;  Laterality: N/A;   CHOLECYSTECTOMY     Complex trunk closure  2005   CSF SHUNT  1987   TRACHEOSTOMY  1987   TUBAL LIGATION Bilateral 02/25/2021   Procedure: BILATERAL TUBAL LIGATION;  Surgeon: Ozan, Jennifer, DO;  Location: [facility];  Service: Obstetrics;  Laterality: Bilateral;   Patient Active Problem List   Diagnosis Date Noted   S/P emergency cesarean section 02/25/2021   Tooth pain 01/20/2021   Mass of mediastinum 06/30/2019   Abnormal mammogram 01/11/2017   Mild persistent asthma without complication 08/17/2016   Chronic lower back pain 09/24/2014   History of thyroid  disease 02/24/2013   TOBACCO USER 04/25/2009    PCP: Senaida Dama, NP   REFERRING PROVIDER: Darryll Eng, NP  REFERRING DIAG: M54.42,M54.41,G89.29 (ICD-10-CM) - Chronic bilateral low back pain with bilateral sciatica M54.16 (ICD-10-CM) - Radiculopathy,  lumbar region M79.18 (ICD-10-CM) - Myofascial pain syndrome  Rationale for Evaluation and Treatment: Rehabilitation  THERAPY DIAG:  Other low back pain - Plan: PT plan of care cert/re-cert  Myofascial low back pain - Plan: PT plan of care cert/re-cert  Muscle weakness (generalized) - Plan: PT plan of care cert/re-cert  ONSET DATE: chronic  SUBJECTIVE:  SUBJECTIVE STATEMENT: HPI: Ann Brown is a 38 y.o. female who comes in today per the request of Lavona Pounds, NP for evaluation of chronic, worsening and severe bilateral lower back pain radiating to hips. Numbness/tingling to bilateral legs. Pain ongoing for many years, started after injury on trampoline during teenage years. Also concerned her back pain is a result of epidural during childbirth. Her pain worsens with activity, movement and bending. She describes pain as sore and aching sensation, currently rates as 6 out of 10. Some relief of pain with home exercise regimen, rest and use of medications. No history of formal physical therapy. Recent lumbar radiographs show minimal degenerative joint changes of lumbar spine. No spondylolisthesis, no pars defects. No recent lumbar MRI imaging. No history of lumbar surgery/injections. 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 Patient denies focal weakness. Reports fall in February, she was evaluated in the ER.   PERTINENT HISTORY:  Chronic, worsening and severe bilateral lower back pain radiating to hips. Paresthesias to bilateral legs. Patient continues to have severe pain despite good conservative therapies such as home exercise regimen, rest and use of medications. Patient's clinical presentation and exam are complex, differentials include lumbar radiculopathy and myofascial pain  syndrome. Her pain pattern does seem to fit more of an L5 distribution. She does have diffuse myofascial tenderness to bilateral thoracic and lumbar paraspinal regions upon palpation today. We discussed treatment plan in detail today. Next step is to obtain lumbar MRI imaging. I also placed order for short course of formal physical therapy as I do feel she would benefit from manual treatments and core strengthening exercises. I would like for her to work with PT to establish consistent home exercise regimen. She has no questions at this time. I will see her back for lumbar MRI review and to discuss treatment options. I encouraged her to remain active as tolerated. No red flag symptoms noted upon exam today.  PAIN:  Are you having pain? Yes: NPRS scale: 10/10 Pain location: low back and BLEs Pain description: ache, burn Aggravating factors: prolonged positioning Relieving factors: rest  PRECAUTIONS: None  RED FLAGS: None   WEIGHT BEARING RESTRICTIONS: No  FALLS:  Has patient fallen in last 6 months? No  OCCUPATION: housekeeping  PLOF: Independent  PATIENT GOALS: To manage my back pain  NEXT MD VISIT: TBD  OBJECTIVE:  Note: Objective measures were completed at Evaluation unless otherwise noted.  DIAGNOSTIC FINDINGS:  FINDINGS: There is no evidence of lumbar spine fracture. Alignment is normal. Minimal anterior spurring noted at L3 and L4. Intervertebral disc spaces are maintained.   IMPRESSION: Minimal degenerative joint changes of lumbar spine.     Electronically Signed   By: Anna Barnes M.D.   On: 09/03/2023 08:59  PATIENT SURVEYS:  Modified Oswestry 14/50 28% perceived disability   MUSCLE LENGTH: Hamstrings: Right 90 deg; Left 90 deg Thomas test: restricted L  POSTURE: No Significant postural limitations  PALPATION: TTP R piriformis  LUMBAR ROM:   AROM eval  Flexion WNL  Extension WNL  Right lateral flexion WNL  Left lateral flexion WNL  Right  rotation 50%  Left rotation 50%   (Blank rows = not tested)  LOWER EXTREMITY ROM:   WNL  Active  Right eval Left eval  Hip flexion    Hip extension    Hip abduction    Hip adduction    Hip internal rotation    Hip external rotation    Knee flexion  Knee extension    Ankle dorsiflexion    Ankle plantarflexion    Ankle inversion    Ankle eversion     (Blank rows = not tested)  LOWER EXTREMITY MMT:  WNL  MMT Right eval Left eval  Hip flexion    Hip extension    Hip abduction    Hip adduction    Hip internal rotation    Hip external rotation    Knee flexion    Knee extension    Ankle dorsiflexion    Ankle plantarflexion    Ankle inversion    Ankle eversion     (Blank rows = not tested)  LUMBAR SPECIAL TESTS:  Straight leg raise test: Negative, Slump test: Negative, FABER test: Negative, and Thomas test: Negative  FUNCTIONAL TESTS:  30 seconds chair stand test  GAIT: Distance walked: 55ftx2 Assistive device utilized: None Level of assistance: Complete Independence Comments: unremarkable  TREATMENT DATE:  [facility] Adult PT Treatment:                                                DATE: 09/27/23  Self Care: Additional minutes spent for educating on updated Therapeutic Home Exercise Program as well as comparing current status to condition at start of symptoms. This included exercises focusing on stretching, strengthening, with focus on eccentric aspects. Long term goals include an improvement in range of motion, strength, endurance as well as avoiding reinjury. Patient's frequency would include in 1-2 times a day, 3-5 times a week for a duration of 6-12 weeks. Proper technique shown and discussed handout in great detail. All questions were discussed and addressed.                                                                                                                                PATIENT EDUCATION:  Education details: Discussed eval findings, rehab  rationale and POC and patient is in agreement  Person educated: Patient Education method: Explanation and Handouts Education comprehension: verbalized understanding and needs further education  HOME EXERCISE PROGRAM: Access Code: [email or web address] URL: [email or web address] Date: 09/27/2023 Prepared by: Gretta Leavens  Exercises - Supine Figure 4 Piriformis Stretch  - 2 x daily - 5 x weekly - 1 sets - 2 reps - 30s hold - Quadruped Thoracic Rotation Full Range with Hand on Neck  - 2 x daily - 5 x weekly - 1 sets - 10 reps - Standing Thoracic Open Book at Wall  - 2 x daily - 5 x weekly - 1 sets - 10 reps  ASSESSMENT:  CLINICAL IMPRESSION: Patient is a 38 y.o. female who was seen today for physical therapy evaluation and treatment for chronic low back pain.  Patient present with trunk mobility restrictions in rotation only, no neural tension signs elicited, core strength functional.  TTP in  R piriformis.  Patient presents with S&S of myofascial ain and MFR discussed.  OBJECTIVE IMPAIRMENTS: decreased activity tolerance, decreased endurance, decreased knowledge of condition, decreased mobility, impaired perceived functional ability, postural dysfunction, and pain.   ACTIVITY LIMITATIONS: lifting, bending, and bed mobility  PARTICIPATION LIMITATIONS: cleaning and occupation  PERSONAL FACTORS: Behavior pattern, Fitness, Past/current experiences, and Time since onset of injury/illness/exacerbation are also affecting patient's functional outcome.   REHAB POTENTIAL: Good  CLINICAL DECISION MAKING: Stable/uncomplicated  EVALUATION COMPLEXITY: Low   GOALS: Goals reviewed with patient? No     LONG TERM GOALS: Target date: 11/08/2023    Patient will acknowledge 6/10 pain at least once during episode of care   Baseline: 10/10 Goal status: INITIAL  2.  Patient will score at least 8/50 on FOTO to signify clinically meaningful improvement in functional abilities.   Baseline:  14/50 Goal status: INITIAL  3.  Patient will increase 30s chair stand reps from 10 to 12 with/without arms to demonstrate and improved functional ability with less pain/difficulty as well as reduce fall risk.  Baseline: 10 Goal status: INITIAL  4.  Patient to demonstrate independence in HEP  Baseline: [id number] Goal status: INITIAL   5.  Increase B trunk rotation to 75% Baseline: 50% Goal status: INITIAL   PLAN:  PT FREQUENCY: 1-2x/week  PT DURATION: 6 weeks  PLANNED INTERVENTIONS: 97110-Therapeutic exercises, 97530- Therapeutic activity, [id number]- Neuromuscular re-education, 97535- Self Care, 47829- Manual therapy, [phone]- Gait training, Patient/Family education, Joint mobilization, and Spinal mobilization.  PLAN FOR NEXT SESSION: HEP review and update, manual techniques as appropriate, aerobic tasks, ROM and flexibility activities, strengthening and PREs, TPDN, gait and balance training as needed   [employer] Authorization   Choose one: Rehabilitative  Standardized Assessment or Functional Outcome Tool: See Pain Assessment and Oswestry  Score or Percent Disability: 14/50  Body Parts Treated (Select each separately):  Lumbopelvic. Overall deficits/functional limitations for body part selected: moderate  If treatment provided at initial evaluation, no treatment charged due to lack of authorization.      Ann Brown, PT 09/27/2023, 2:52 PM

## 2023-09-27 ENCOUNTER — Ambulatory Visit: Attending: Family

## 2023-09-27 ENCOUNTER — Other Ambulatory Visit: Payer: Self-pay

## 2023-09-27 DIAGNOSIS — M5441 Lumbago with sciatica, right side: Secondary | ICD-10-CM | POA: Insufficient documentation

## 2023-09-27 DIAGNOSIS — M6281 Muscle weakness (generalized): Secondary | ICD-10-CM | POA: Diagnosis present

## 2023-09-27 DIAGNOSIS — M545 Low back pain, unspecified: Secondary | ICD-10-CM | POA: Insufficient documentation

## 2023-09-27 DIAGNOSIS — G8929 Other chronic pain: Secondary | ICD-10-CM | POA: Insufficient documentation

## 2023-09-27 DIAGNOSIS — M5416 Radiculopathy, lumbar region: Secondary | ICD-10-CM | POA: Insufficient documentation

## 2023-09-27 DIAGNOSIS — M7918 Myalgia, other site: Secondary | ICD-10-CM | POA: Insufficient documentation

## 2023-09-27 DIAGNOSIS — M5459 Other low back pain: Secondary | ICD-10-CM | POA: Insufficient documentation

## 2023-09-27 DIAGNOSIS — M5442 Lumbago with sciatica, left side: Secondary | ICD-10-CM | POA: Insufficient documentation

## 2023-11-04 ENCOUNTER — Telehealth: Payer: Self-pay | Admitting: Physical Medicine and Rehabilitation

## 2023-11-04 NOTE — Telephone Encounter (Signed)
 Spoke with patient today via telephone. She continues to have severe pain despite 6 weeks of physician directed home exercise regimen. Also reports difficulty performing job tasks and limited functional ability. Would recommend we proceed with lumbar MRI imaging.

## 2023-11-15 ENCOUNTER — Telehealth: Admitting: Family Medicine

## 2023-11-15 DIAGNOSIS — K0889 Other specified disorders of teeth and supporting structures: Secondary | ICD-10-CM

## 2023-11-15 DIAGNOSIS — S025XXD Fracture of tooth (traumatic), subsequent encounter for fracture with routine healing: Secondary | ICD-10-CM

## 2023-11-16 ENCOUNTER — Other Ambulatory Visit: Payer: Self-pay | Admitting: Family Medicine

## 2023-11-16 DIAGNOSIS — K0889 Other specified disorders of teeth and supporting structures: Secondary | ICD-10-CM

## 2023-11-16 MED ORDER — PENICILLIN V POTASSIUM 500 MG PO TABS: 500.0000 mg | ORAL_TABLET | Freq: Three times a day (TID) | ORAL | 0 refills | Status: AC

## 2023-11-16 NOTE — Progress Notes (Signed)
 Pt sent a message stating her antibiotics were not sent for dental infection.  Will send now.

## 2023-11-16 NOTE — Progress Notes (Signed)

## 2023-11-16 NOTE — Progress Notes (Deleted)
 Virtual Visit Consent   Ann Brown, you are scheduled for a virtual visit with a [facility] provider today. Just as with appointments in the office, your consent must be obtained to participate. Your consent will be active for this visit and any virtual visit you may have with one of our providers in the next 365 days. If you have a MyChart account, a copy of this consent can be sent to you electronically.  As this is a virtual visit, video technology does not allow for your provider to perform a traditional examination. This may limit your provider's ability to fully assess your condition. If your provider identifies any concerns that need to be evaluated in person or the need to arrange testing (such as labs, EKG, etc.), we will make arrangements to do so. Although advances in technology are sophisticated, we cannot ensure that it will always work on either your end or our end. If the connection with a video visit is poor, the visit may have to be switched to a telephone visit. With either a video or telephone visit, we are not always able to ensure that we have a secure connection.  By engaging in this virtual visit, you consent to the provision of healthcare and authorize for your insurance to be billed (if applicable) for the services provided during this visit. Depending on your insurance coverage, you may receive a charge related to this service.  I need to obtain your verbal consent now. Are you willing to proceed with your visit today? Ann Brown has provided verbal consent on 11/16/2023 for a virtual visit (video or telephone). Ann Brown, [place]  Date: 11/16/2023 1:12 PM   Virtual Visit via Video Note   I, Ann Brown, connected with  Ann Brown  ([id number], 06/03/1985) on 11/16/23 at  by a video-enabled telemedicine application and verified that I am speaking with the correct person using two identifiers.  Location: Patient: {Virtual Visit Location  Patient:[id number]::Home} Provider: Virtual Visit Location Provider: Home Office   I discussed the limitations of evaluation and management by telemedicine and the availability of in person appointments. The patient expressed understanding and agreed to proceed.    History of Present Illness: Ann Brown is a 38 y.o. who identifies as a female who was assigned female at birth, and is being seen today for ***.  HPI: HPI  Problems:  Patient Active Problem List   Diagnosis Date Noted   S/P emergency cesarean section 02/25/2021   Tooth pain 01/20/2021   Mass of mediastinum 06/30/2019   Abnormal mammogram 01/11/2017   Mild persistent asthma without complication 08/17/2016   Chronic lower back pain 09/24/2014   History of thyroid  disease 02/24/2013   TOBACCO USER 04/25/2009    Allergies:  Allergies  Allergen Reactions   Diphenhydramine  Hcl Other (See Comments)   Other    Ketorolac  Other (See Comments)    Stomach pains    Medications:  Current Outpatient Medications:    acetaminophen  (TYLENOL ) 325 MG tablet, Take 2 tablets (650 mg total) by mouth every 6 (six) hours as needed for moderate pain., Disp: 60 tablet, Rfl: 0   albuterol  (PROVENTIL ) (2.5 MG/3ML) 0.083% nebulizer solution, Take 3 mLs (2.5 mg total) by nebulization every 6 (six) hours as needed for wheezing or shortness of breath., Disp: 75 mL, Rfl: 0   albuterol  (VENTOLIN  HFA) 108 (90 Base) MCG/ACT inhaler, Inhale 1-2 puffs into the lungs every 6 (six) hours as needed for wheezing or shortness of breath. (  Patient not taking: Reported on 09/02/2023), Disp: 18 g, Rfl: 0   amoxicillin -clavulanate (AUGMENTIN ) 875-125 MG tablet, Take 1 tablet by mouth 2 (two) times daily. (Patient not taking: Reported on 09/02/2023), Disp: 20 tablet, Rfl: 0   amphetamine -dextroamphetamine  (ADDERALL) 20 MG tablet, Take 20 mg by mouth 2 (two) times daily., Disp: , Rfl:    budeson-glycopyrrolate-formoterol  (BREZTRI  AEROSPHERE) 160-9-4.8 MCG/ACT AERO  inhaler, Inhale 1-2 puffs into the lungs in the morning and at bedtime., Disp: 10.7 g, Rfl: 6   buPROPion  (WELLBUTRIN  SR) 100 MG 12 hr tablet, take 1 tablet by oral route every morning, Disp: , Rfl:    busPIRone  (BUSPAR ) 5 MG tablet, Take 5 mg by mouth 2 (two) times daily as needed., Disp: , Rfl:    cetirizine  (ZYRTEC ) 10 MG tablet, Take 1 tablet (10 mg total) by mouth daily., Disp: 30 tablet, Rfl: 11   chlorhexidine  (PERIDEX ) 0.12 % solution, Use as directed 10 mLs in the mouth or throat 2 (two) times daily. (Patient not taking: Reported on 09/02/2023), Disp: 120 mL, Rfl: 0   esomeprazole  (NEXIUM ) 40 MG capsule, Take 1 capsule (40 mg total) by mouth 2 (two) times daily before a meal., Disp: 180 capsule, Rfl: 0   fluticasone  (FLONASE ) 50 MCG/ACT nasal spray, Place 2 sprays into both nostrils daily., Disp: 16 g, Rfl: 6   hydrOXYzine (ATARAX/VISTARIL) 10 MG tablet, Take 10 mg by mouth at bedtime as needed. (Patient not taking: Reported on 09/02/2023), Disp: , Rfl:    ibuprofen  (ADVIL ) 600 MG tablet, Take 1 tablet (600 mg total) by mouth every 8 (eight) hours as needed. (Patient not taking: Reported on 09/02/2023), Disp: 30 tablet, Rfl: 0   ibuprofen  (ADVIL ) 800 MG tablet, Take 1 tablet (800 mg total) by mouth every 8 (eight) hours as needed. (Patient not taking: Reported on 09/02/2023), Disp: 30 tablet, Rfl: 0   lidocaine  (LIDODERM ) 5 %, Place 1 patch onto the skin daily. Remove & Discard patch within 12 hours or as directed by MD (Patient not taking: Reported on 09/02/2023), Disp: 30 patch, Rfl: 0   lidocaine  (LIDODERM ) 5 %, Place 1 patch onto the skin daily. Remove & Discard patch within 12 hours or as directed by MD (Patient not taking: Reported on 09/02/2023), Disp: 30 patch, Rfl: 0   methocarbamol  (ROBAXIN ) 500 MG tablet, Take 1 tablet (500 mg total) by mouth every 6 (six) hours as needed for muscle spasms. (Patient not taking: Reported on 09/02/2023), Disp: 30 tablet, Rfl: 0   Prenatal Vit-Fe Fumarate-FA  (PREPLUS) 27-1 MG TABS, Take 1 tablet by mouth daily. (Patient not taking: Reported on 09/02/2023), Disp: 30 tablet, Rfl: 8   silver  sulfADIAZINE  (SILVADENE ) 1 % cream, Apply 1 application. topically daily. (Patient not taking: Reported on 09/02/2023), Disp: 50 g, Rfl: 0   trimethoprim -polymyxin b  (POLYTRIM ) ophthalmic solution, Place 1 drop into the right eye every 6 (six) hours. (Patient not taking: Reported on 09/02/2023), Disp: 10 mL, Rfl: 0  Observations/Objective: Patient is well-developed, well-nourished in no acute distress.  Resting comfortably *** at home.  Head is normocephalic, atraumatic.  No labored breathing. *** Speech is clear and coherent with logical content.  Patient is alert and oriented at baseline.  ***  Assessment and Plan: There are no diagnoses linked to this encounter. ***  Follow Up Instructions: I discussed the assessment and treatment plan with the patient. The patient was provided an opportunity to ask questions and all were answered. The patient agreed with the plan and demonstrated an understanding  of the instructions.  A copy of instructions were sent to the patient via MyChart unless otherwise noted below.   {EMAIL AVS:[id number]::Patient has requested to receive PHI (AVS, Work Notes, etc) pertaining to this video visit through e-mail as they are currently without active MyChart. They have voiced understand that email is not considered secure and their health information could be viewed by someone other than the patient. }  The patient was advised to call back or seek an in-person evaluation if the symptoms worsen or if the condition fails to improve as anticipated.    Ann Mater, PA-C

## 2023-11-17 ENCOUNTER — Other Ambulatory Visit: Payer: Self-pay | Admitting: Medical Genetics

## 2023-11-27 ENCOUNTER — Other Ambulatory Visit (HOSPITAL_COMMUNITY)

## 2023-12-11 ENCOUNTER — Other Ambulatory Visit (HOSPITAL_COMMUNITY)
Admission: RE | Admit: 2023-12-11 | Discharge: 2023-12-11 | Disposition: A | Discharge status: Home / Self Care | Payer: Self-pay | Source: Ambulatory Visit | Attending: Medical Genetics | Admitting: Medical Genetics

## 2023-12-18 ENCOUNTER — Ambulatory Visit

## 2023-12-23 LAB — GENECONNECT MOLECULAR SCREEN: Genetic Analysis Overall Interpretation: NEGATIVE

## 2024-01-22 ENCOUNTER — Telehealth: Payer: Self-pay

## 2024-01-22 NOTE — Telephone Encounter (Signed)
-----   Message ----- From: Claudene Hadassah PARAS, CMA Sent: 01/17/2024   8:48 AM EDT To: Gardiner Masters, MD; Duwaine FORBES Pouch, NP Subject: FW: MRI                                         Physical therapy  Done at home patient gave these dates and advised she did 30 min per day. June 11,18,25. ; July-10,17,24,31;  August-6,13,20. This is per patient information she gave.

## 2024-01-27 ENCOUNTER — Telehealth: Payer: Self-pay

## 2024-01-27 NOTE — Telephone Encounter (Signed)
 Pharmacy Patient Advocate Encounter  Received notification from [facility] MEDICAID that Prior Authorization for BREZTRI  has been APPROVED from 01/27/2024 to 01/26/2025   PA #/Case ID/Reference #: [id number]

## 2024-02-05 ENCOUNTER — Telehealth: Admitting: Physician Assistant

## 2024-02-05 DIAGNOSIS — T3695XA Adverse effect of unspecified systemic antibiotic, initial encounter: Secondary | ICD-10-CM

## 2024-02-05 DIAGNOSIS — B379 Candidiasis, unspecified: Secondary | ICD-10-CM

## 2024-02-05 DIAGNOSIS — J069 Acute upper respiratory infection, unspecified: Secondary | ICD-10-CM | POA: Diagnosis not present

## 2024-02-05 DIAGNOSIS — B9689 Other specified bacterial agents as the cause of diseases classified elsewhere: Secondary | ICD-10-CM

## 2024-02-05 DIAGNOSIS — J45909 Unspecified asthma, uncomplicated: Secondary | ICD-10-CM | POA: Diagnosis not present

## 2024-02-05 MED ORDER — PROMETHAZINE-DM 6.25-15 MG/5ML PO SYRP
5.0000 mL | ORAL_SOLUTION | Freq: Four times a day (QID) | ORAL | 0 refills | Status: AC | PRN
Start: 2024-02-05 — End: ?

## 2024-02-05 MED ORDER — FLUCONAZOLE 150 MG PO TABS
150.0000 mg | ORAL_TABLET | ORAL | 0 refills | Status: AC | PRN
Start: 2024-02-05 — End: ?

## 2024-02-05 MED ORDER — ALBUTEROL SULFATE HFA 108 (90 BASE) MCG/ACT IN AERS: 1.0000 | INHALATION_SPRAY | Freq: Four times a day (QID) | RESPIRATORY_TRACT | 0 refills | Status: AC | PRN

## 2024-02-05 MED ORDER — PREDNISONE 20 MG PO TABS
40.0000 mg | ORAL_TABLET | Freq: Every day | ORAL | 0 refills | Status: AC
Start: 2024-02-05 — End: ?

## 2024-02-05 MED ORDER — AMOXICILLIN-POT CLAVULANATE 875-125 MG PO TABS: 1.0000 | ORAL_TABLET | Freq: Two times a day (BID) | ORAL | 0 refills | Status: DC

## 2024-02-05 NOTE — Progress Notes (Signed)
 Virtual Visit Consent   Ann Brown Seats, you are scheduled for a virtual visit with a [facility] provider today. Just as with appointments in the office, your consent must be obtained to participate. Your consent will be active for this visit and any virtual visit you may have with one of our providers in the next 365 days. If you have a MyChart account, a copy of this consent can be sent to you electronically.  As this is a virtual visit, video technology does not allow for your provider to perform a traditional examination. This may limit your provider's ability to fully assess your condition. If your provider identifies any concerns that need to be evaluated in person or the need to arrange testing (such as labs, EKG, etc.), we will make arrangements to do so. Although advances in technology are sophisticated, we cannot ensure that it will always work on either your end or our end. If the connection with a video visit is poor, the visit may have to be switched to a telephone visit. With either a video or telephone visit, we are not always able to ensure that we have a secure connection.  By engaging in this virtual visit, you consent to the provision of healthcare and authorize for your insurance to be billed (if applicable) for the services provided during this visit. Depending on your insurance coverage, you may receive a charge related to this service.  I need to obtain your verbal consent now. Are you willing to proceed with your visit today? BREONIA KIRSTEIN has provided verbal consent on 02/05/2024 for a virtual visit (video or telephone). Delon CHRISTELLA Dickinson, PA-C  Date: 02/05/2024 6:15 PM   Virtual Visit via Video Note   I, Delon CHRISTELLA Dickinson, connected with  Ann Brown  ([id number], 1985/08/17) on 02/05/24 at  6:00 PM EDT by a video-enabled telemedicine application and verified that I am speaking with the correct person using two identifiers.  Location: Patient: Virtual Visit  Location Patient: Home Provider: Virtual Visit Location Provider: Home Office   I discussed the limitations of evaluation and management by telemedicine and the availability of in person appointments. The patient expressed understanding and agreed to proceed.    History of Present Illness: Ann Brown is a 38 y.o. who identifies as a female who was assigned female at birth, and is being seen today for URI symptoms.  HPI: URI  This is a new problem. The current episode started 1 to 4 weeks ago (2 weeks). The problem has been gradually worsening. Maximum temperature: subjective, waking up sweating. Associated symptoms include chest pain (from coughing and tightness), congestion, coughing, headaches, nausea (last week), a plugged ear sensation, rhinorrhea, sinus pain and wheezing. Pertinent negatives include no diarrhea, ear pain, sore throat or vomiting. She has tried inhaler use (ibuprofen , breztri ) for the symptoms. The treatment provided no relief.   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   Abnormal mammogram 01/11/2017   Mild persistent asthma without complication 08/17/2016   Chronic lower back pain 09/24/2014   History of thyroid  disease 02/24/2013   TOBACCO USER 04/25/2009    Allergies:  Allergies  Allergen Reactions   Diphenhydramine  Hcl Other (See Comments)   Other    Ketorolac  Other (See Comments)    Stomach pains    Medications:  Current Outpatient Medications:  acetaminophen  (TYLENOL ) 325 MG tablet, Take 2 tablets (650 mg total) by mouth every 6 (six) hours as needed for moderate pain., Disp: 60 tablet, Rfl: 0   albuterol  (PROVENTIL ) (2.5 MG/3ML) 0.083% nebulizer solution, Take 3 mLs (2.5 mg total) by nebulization every 6 (six) hours as needed for wheezing or shortness of breath., Disp: 75 mL, Rfl: 0   albuterol  (VENTOLIN  HFA)  108 (90 Base) MCG/ACT inhaler, Inhale 1-2 puffs into the lungs every 6 (six) hours as needed., Disp: 8 g, Rfl: 0   amoxicillin -clavulanate (AUGMENTIN ) 875-125 MG tablet, Take 1 tablet by mouth 2 (two) times daily., Disp: 14 tablet, Rfl: 0   amphetamine -dextroamphetamine  (ADDERALL) 20 MG tablet, Take 20 mg by mouth 2 (two) times daily., Disp: , Rfl:    budeson-glycopyrrolate-formoterol  (BREZTRI  AEROSPHERE) 160-9-4.8 MCG/ACT AERO inhaler, Inhale 1-2 puffs into the lungs in the morning and at bedtime., Disp: 10.7 g, Rfl: 6   buPROPion  (WELLBUTRIN  SR) 100 MG 12 hr tablet, take 1 tablet by oral route every morning, Disp: , Rfl:    busPIRone  (BUSPAR ) 5 MG tablet, Take 5 mg by mouth 2 (two) times daily as needed., Disp: , Rfl:    cetirizine  (ZYRTEC ) 10 MG tablet, Take 1 tablet (10 mg total) by mouth daily., Disp: 30 tablet, Rfl: 11   chlorhexidine  (PERIDEX ) 0.12 % solution, Use as directed 10 mLs in the mouth or throat 2 (two) times daily. (Patient not taking: Reported on 09/02/2023), Disp: 120 mL, Rfl: 0   esomeprazole  (NEXIUM ) 40 MG capsule, Take 1 capsule (40 mg total) by mouth 2 (two) times daily before a meal., Disp: 180 capsule, Rfl: 0   fluconazole  (DIFLUCAN ) 150 MG tablet, Take 1 tablet (150 mg total) by mouth every 3 (three) days as needed., Disp: 2 tablet, Rfl: 0   fluticasone  (FLONASE ) 50 MCG/ACT nasal spray, Place 2 sprays into both nostrils daily., Disp: 16 g, Rfl: 6   hydrOXYzine (ATARAX/VISTARIL) 10 MG tablet, Take 10 mg by mouth at bedtime as needed. (Patient not taking: Reported on 09/02/2023), Disp: , Rfl:    ibuprofen  (ADVIL ) 600 MG tablet, Take 1 tablet (600 mg total) by mouth every 8 (eight) hours as needed. (Patient not taking: Reported on 09/02/2023), Disp: 30 tablet, Rfl: 0   ibuprofen  (ADVIL ) 800 MG tablet, Take 1 tablet (800 mg total) by mouth every 8 (eight) hours as needed. (Patient not taking: Reported on 09/02/2023), Disp: 30 tablet, Rfl: 0   lidocaine  (LIDODERM ) 5 %, Place 1 patch  onto the skin daily. Remove & Discard patch within 12 hours or as directed by MD (Patient not taking: Reported on 09/02/2023), Disp: 30 patch, Rfl: 0   lidocaine  (LIDODERM ) 5 %, Place 1 patch onto the skin daily. Remove & Discard patch within 12 hours or as directed by MD (Patient not taking: Reported on 09/02/2023), Disp: 30 patch, Rfl: 0   methocarbamol  (ROBAXIN ) 500 MG tablet, Take 1 tablet (500 mg total) by mouth every 6 (six) hours as needed for muscle spasms. (Patient not taking: Reported on 09/02/2023), Disp: 30 tablet, Rfl: 0   predniSONE  (DELTASONE ) 20 MG tablet, Take 2 tablets (40 mg total) by mouth daily with breakfast., Disp: 10 tablet, Rfl: 0   Prenatal Vit-Fe Fumarate-FA (PREPLUS) 27-1 MG TABS, Take 1 tablet by mouth daily. (Patient not taking: Reported on 09/02/2023), Disp: 30 tablet, Rfl: 8   promethazine -dextromethorphan (PROMETHAZINE -DM) 6.25-15 MG/5ML syrup, Take 5 mLs by mouth 4 (four) times daily as needed., Disp: 118 mL, Rfl: 0   silver  sulfADIAZINE  (  SILVADENE ) 1 % cream, Apply 1 application. topically daily. (Patient not taking: Reported on 09/02/2023), Disp: 50 g, Rfl: 0   trimethoprim -polymyxin b  (POLYTRIM ) ophthalmic solution, Place 1 drop into the right eye every 6 (six) hours. (Patient not taking: Reported on 09/02/2023), Disp: 10 mL, Rfl: 0  Observations/Objective: Patient is well-developed, well-nourished in no acute distress.  Resting comfortably at home.  Head is normocephalic, atraumatic.  No labored breathing.  Speech is clear and coherent with logical content.  Patient is alert and oriented at baseline.    Assessment and Plan: 1. Bacterial upper respiratory infection (Primary) - amoxicillin -clavulanate (AUGMENTIN ) 875-125 MG tablet; Take 1 tablet by mouth 2 (two) times daily.  Dispense: 14 tablet; Refill: 0 - promethazine -dextromethorphan (PROMETHAZINE -DM) 6.25-15 MG/5ML syrup; Take 5 mLs by mouth 4 (four) times daily as needed.  Dispense: 118 mL; Refill: 0 -  predniSONE  (DELTASONE ) 20 MG tablet; Take 2 tablets (40 mg total) by mouth daily with breakfast.  Dispense: 10 tablet; Refill: 0  2. Asthma, unspecified asthma severity, unspecified whether complicated, unspecified whether persistent - albuterol  (VENTOLIN  HFA) 108 (90 Base) MCG/ACT inhaler; Inhale 1-2 puffs into the lungs every 6 (six) hours as needed.  Dispense: 8 g; Refill: 0 - promethazine -dextromethorphan (PROMETHAZINE -DM) 6.25-15 MG/5ML syrup; Take 5 mLs by mouth 4 (four) times daily as needed.  Dispense: 118 mL; Refill: 0 - predniSONE  (DELTASONE ) 20 MG tablet; Take 2 tablets (40 mg total) by mouth daily with breakfast.  Dispense: 10 tablet; Refill: 0  3. Antibiotic-induced yeast infection - fluconazole  (DIFLUCAN ) 150 MG tablet; Take 1 tablet (150 mg total) by mouth every 3 (three) days as needed.  Dispense: 2 tablet; Refill: 0  - Worsening over a week despite OTC medications - Will treat with Augmentin  - Add Albuterol  inhaler - Add Prednisone  burst - Promethazine  DM cough syrup (drowsiness precautions discussed) - Can continue Mucinex (Plain) during the day - Push fluids.  - Rest.  - Steam and humidifier can help - Diflucan  given as prophylaxis as patient tends to get vaginal yeast infections with antibiotic use - Seek in person evaluation if worsening or symptoms fail to improve    Follow Up Instructions: I discussed the assessment and treatment plan with the patient. The patient was provided an opportunity to ask questions and all were answered. The patient agreed with the plan and demonstrated an understanding of the instructions.  A copy of instructions were sent to the patient via MyChart unless otherwise noted below.    The patient was advised to call back or seek an in-person evaluation if the symptoms worsen or if the condition fails to improve as anticipated.    Delon CHRISTELLA Dickinson, PA-C

## 2024-02-05 NOTE — Patient Instructions (Signed)
 Ann Brown, thank you for joining Delon CHRISTELLA Dickinson, PA-C for today's virtual visit.  While this provider is not your primary care provider (PCP), if your PCP is located in our provider database this encounter information will be shared with them immediately following your visit.   A [facility] MyChart account gives you access to today's visit and all your visits, tests, and labs performed at [facility]  click here if you don't have a [facility] MyChart account or go to [email or web address]  Consent: (Patient) Ann Brown provided verbal consent for this virtual visit at the beginning of the encounter.  Current Medications:  Current Outpatient Medications:    acetaminophen  (TYLENOL ) 325 MG tablet, Take 2 tablets (650 mg total) by mouth every 6 (six) hours as needed for moderate pain., Disp: 60 tablet, Rfl: 0   albuterol  (PROVENTIL ) (2.5 MG/3ML) 0.083% nebulizer solution, Take 3 mLs (2.5 mg total) by nebulization every 6 (six) hours as needed for wheezing or shortness of breath., Disp: 75 mL, Rfl: 0   albuterol  (VENTOLIN  HFA) 108 (90 Base) MCG/ACT inhaler, Inhale 1-2 puffs into the lungs every 6 (six) hours as needed., Disp: 8 g, Rfl: 0   amoxicillin -clavulanate (AUGMENTIN ) 875-125 MG tablet, Take 1 tablet by mouth 2 (two) times daily., Disp: 14 tablet, Rfl: 0   amphetamine -dextroamphetamine  (ADDERALL) 20 MG tablet, Take 20 mg by mouth 2 (two) times daily., Disp: , Rfl:    budeson-glycopyrrolate-formoterol  (BREZTRI  AEROSPHERE) 160-9-4.8 MCG/ACT AERO inhaler, Inhale 1-2 puffs into the lungs in the morning and at bedtime., Disp: 10.7 g, Rfl: 6   buPROPion  (WELLBUTRIN  SR) 100 MG 12 hr tablet, take 1 tablet by oral route every morning, Disp: , Rfl:    busPIRone  (BUSPAR ) 5 MG tablet, Take 5 mg by mouth 2 (two) times daily as needed., Disp: , Rfl:    cetirizine  (ZYRTEC ) 10 MG tablet, Take 1 tablet (10 mg total) by mouth daily., Disp: 30 tablet, Rfl: 11    chlorhexidine  (PERIDEX ) 0.12 % solution, Use as directed 10 mLs in the mouth or throat 2 (two) times daily. (Patient not taking: Reported on 09/02/2023), Disp: 120 mL, Rfl: 0   esomeprazole  (NEXIUM ) 40 MG capsule, Take 1 capsule (40 mg total) by mouth 2 (two) times daily before a meal., Disp: 180 capsule, Rfl: 0   fluconazole  (DIFLUCAN ) 150 MG tablet, Take 1 tablet (150 mg total) by mouth every 3 (three) days as needed., Disp: 2 tablet, Rfl: 0   fluticasone  (FLONASE ) 50 MCG/ACT nasal spray, Place 2 sprays into both nostrils daily., Disp: 16 g, Rfl: 6   hydrOXYzine (ATARAX/VISTARIL) 10 MG tablet, Take 10 mg by mouth at bedtime as needed. (Patient not taking: Reported on 09/02/2023), Disp: , Rfl:    ibuprofen  (ADVIL ) 600 MG tablet, Take 1 tablet (600 mg total) by mouth every 8 (eight) hours as needed. (Patient not taking: Reported on 09/02/2023), Disp: 30 tablet, Rfl: 0   ibuprofen  (ADVIL ) 800 MG tablet, Take 1 tablet (800 mg total) by mouth every 8 (eight) hours as needed. (Patient not taking: Reported on 09/02/2023), Disp: 30 tablet, Rfl: 0   lidocaine  (LIDODERM ) 5 %, Place 1 patch onto the skin daily. Remove & Discard patch within 12 hours or as directed by MD (Patient not taking: Reported on 09/02/2023), Disp: 30 patch, Rfl: 0   lidocaine  (LIDODERM ) 5 %, Place 1 patch onto the skin daily. Remove & Discard patch within 12 hours or as directed by MD (Patient not taking: Reported on 09/02/2023), Disp:  30 patch, Rfl: 0   methocarbamol  (ROBAXIN ) 500 MG tablet, Take 1 tablet (500 mg total) by mouth every 6 (six) hours as needed for muscle spasms. (Patient not taking: Reported on 09/02/2023), Disp: 30 tablet, Rfl: 0   predniSONE  (DELTASONE ) 20 MG tablet, Take 2 tablets (40 mg total) by mouth daily with breakfast., Disp: 10 tablet, Rfl: 0   Prenatal Vit-Fe Fumarate-FA (PREPLUS) 27-1 MG TABS, Take 1 tablet by mouth daily. (Patient not taking: Reported on 09/02/2023), Disp: 30 tablet, Rfl: 8    promethazine -dextromethorphan (PROMETHAZINE -DM) 6.25-15 MG/5ML syrup, Take 5 mLs by mouth 4 (four) times daily as needed., Disp: 118 mL, Rfl: 0   silver  sulfADIAZINE  (SILVADENE ) 1 % cream, Apply 1 application. topically daily. (Patient not taking: Reported on 09/02/2023), Disp: 50 g, Rfl: 0   trimethoprim -polymyxin b  (POLYTRIM ) ophthalmic solution, Place 1 drop into the right eye every 6 (six) hours. (Patient not taking: Reported on 09/02/2023), Disp: 10 mL, Rfl: 0   Medications ordered in this encounter:  Meds ordered this encounter  Medications   albuterol  (VENTOLIN  HFA) 108 (90 Base) MCG/ACT inhaler    Sig: Inhale 1-2 puffs into the lungs every 6 (six) hours as needed.    Dispense:  8 g    Refill:  0    Supervising Provider:   BLAISE ALEENE KIDD [[id number]]   amoxicillin -clavulanate (AUGMENTIN ) 875-125 MG tablet    Sig: Take 1 tablet by mouth 2 (two) times daily.    Dispense:  14 tablet    Refill:  0    Supervising Provider:   LAMPTEY, PHILIP O [[id number]]   promethazine -dextromethorphan (PROMETHAZINE -DM) 6.25-15 MG/5ML syrup    Sig: Take 5 mLs by mouth 4 (four) times daily as needed.    Dispense:  118 mL    Refill:  0    Supervising Provider:   BLAISE ALEENE KIDD [id number]   fluconazole  (DIFLUCAN ) 150 MG tablet    Sig: Take 1 tablet (150 mg total) by mouth every 3 (three) days as needed.    Dispense:  2 tablet    Refill:  0    Supervising Provider:   LAMPTEY, PHILIP O [[id number]]   predniSONE  (DELTASONE ) 20 MG tablet    Sig: Take 2 tablets (40 mg total) by mouth daily with breakfast.    Dispense:  10 tablet    Refill:  0    Supervising Provider:   BLAISE ALEENE KIDD [[id number]]     *If you need refills on other medications prior to your next appointment, please contact your pharmacy*  Follow-Up: Call back or seek an in-person evaluation if the symptoms worsen or if the condition fails to improve as anticipated.  [facility] [phone]  Other Instructions Upper  Respiratory Infection, Adult An upper respiratory infection (URI) is a common viral infection of the nose, throat, and upper air passages that lead to the lungs. The most common type of URI is the common cold. URIs usually get better on their own, without medical treatment. What are the causes? A URI is caused by a virus. You may catch a virus by: Breathing in droplets from an infected person's cough or sneeze. Touching something that has been exposed to the virus (is contaminated) and then touching your mouth, nose, or eyes. What increases the risk? You are more likely to get a URI if: You are very young or very old. You have close contact with others, such as at work, school, or a health care facility. You smoke. You have  long-term (chronic) heart or lung disease. You have a weakened disease-fighting system (immune system). You have nasal allergies or asthma. You are experiencing a lot of stress. You have poor nutrition. What are the signs or symptoms? A URI usually involves some of the following symptoms: Runny or stuffy (congested) nose. Cough. Sneezing. Sore throat. Headache. Fatigue. Fever. Loss of appetite. Pain in your forehead, behind your eyes, and over your cheekbones (sinus pain). Muscle aches. Redness or irritation of the eyes. Pressure in the ears or face. How is this diagnosed? This condition may be diagnosed based on your medical history and symptoms, and a physical exam. Your health care provider may use a swab to take a mucus sample from your nose (nasal swab). This sample can be tested to determine what virus is causing the illness. How is this treated? URIs usually get better on their own within 7-10 days. Medicines cannot cure URIs, but your health care provider may recommend certain medicines to help relieve symptoms, such as: Over-the-counter cold medicines. Cough suppressants. Coughing is a type of defense against infection that helps to clear the  respiratory system, so take these medicines only as recommended by your health care provider. Fever-reducing medicines. Follow these instructions at home: Activity Rest as needed. If you have a fever, stay home from work or school until your fever is gone or until your health care provider says your URI cannot spread to other people (is no longer contagious). Your health care provider may have you wear a face mask to prevent your infection from spreading. Relieving symptoms Gargle with a mixture of salt and water 3-4 times a day or as needed. To make salt water, completely dissolve -1 tsp (3-6 g) of salt in 1 cup (237 mL) of warm water. Use a cool-mist humidifier to add moisture to the air. This can help you breathe more easily. Eating and drinking  Drink enough fluid to keep your urine pale yellow. Eat soups and other clear broths. General instructions  Take over-the-counter and prescription medicines only as told by your health care provider. These include cold medicines, fever reducers, and cough suppressants. Do not use any products that contain nicotine  or tobacco. These products include cigarettes, chewing tobacco, and vaping devices, such as e-cigarettes. If you need help quitting, ask your health care provider. Stay away from secondhand smoke. Stay up to date on all immunizations, including the yearly (annual) flu vaccine. Keep all follow-up visits. This is important. How to prevent the spread of infection to others URIs can be contagious. To prevent the infection from spreading: Wash your hands with soap and water for at least 20 seconds. If soap and water are not available, use hand sanitizer. Avoid touching your mouth, face, eyes, or nose. Cough or sneeze into a tissue or your sleeve or elbow instead of into your hand or into the air.  Contact a health care provider if: You are getting worse instead of better. You have a fever or chills. Your mucus is brown or red. You have  yellow or brown discharge coming from your nose. You have pain in your face, especially when you bend forward. You have swollen neck glands. You have pain while swallowing. You have white areas in the back of your throat. Get help right away if: You have shortness of breath that gets worse. You have severe or persistent: Headache. Ear pain. Sinus pain. Chest pain. You have chronic lung disease along with any of the following: Making high-pitched whistling sounds when  you breathe, most often when you breathe out (wheezing). Prolonged cough (more than 14 days). Coughing up blood. A change in your usual mucus. You have a stiff neck. You have changes in your: Vision. Hearing. Thinking. Mood. These symptoms may be an emergency. Get help right away. Call 911. Do not wait to see if the symptoms will go away. Do not drive yourself to the hospital. Summary An upper respiratory infection (URI) is a common infection of the nose, throat, and upper air passages that lead to the lungs. A URI is caused by a virus. URIs usually get better on their own within 7-10 days. Medicines cannot cure URIs, but your health care provider may recommend certain medicines to help relieve symptoms. This information is not intended to replace advice given to you by your health care provider. Make sure you discuss any questions you have with your health care provider. Document Revised: 11/09/2020 Document Reviewed: 11/09/2020 Elsevier Patient Education  2024 [employer]   If you have been instructed to have an in-person evaluation today at a local Urgent Care facility, please use the link below. It will take you to a list of all of our available [facility], including address, phone number and hours of operation. Please do not delay care.  [facility]  If you or a family member do not have a primary care provider, use the link below to schedule a visit and establish care. When you  choose a [facility] primary care physician or advanced practice provider, you gain a long-term partner in health. Find a Primary Care Provider  Learn more about [facility]'s in-office and virtual care options: [facility] - Get Care Now

## 2024-02-24 ENCOUNTER — Encounter: Payer: Self-pay | Admitting: Radiology

## 2024-03-03 ENCOUNTER — Other Ambulatory Visit (INDEPENDENT_AMBULATORY_CARE_PROVIDER_SITE_OTHER): Payer: Self-pay

## 2024-03-03 ENCOUNTER — Ambulatory Visit: Admitting: Family

## 2024-03-03 DIAGNOSIS — M25551 Pain in right hip: Secondary | ICD-10-CM

## 2024-03-03 MED ORDER — HYDROCODONE-ACETAMINOPHEN 5-325 MG PO TABS: 1.0000 | ORAL_TABLET | Freq: Four times a day (QID) | ORAL | 0 refills | Status: AC | PRN

## 2024-03-03 MED ORDER — PREDNISONE 10 MG PO TABS: 10.0000 mg | ORAL_TABLET | Freq: Every day | ORAL | 0 refills | Status: AC

## 2024-03-03 NOTE — Progress Notes (Signed)
 Office Visit Note   Patient: Ann Brown           Date of Birth: 1985/07/25           MRN: [id number] Visit Date: 03/03/2024              Requested by: Jaycee Greig PARAS, NP [address] [place],  [place] [address] PCP: Jaycee Greig PARAS, NP  Chief Complaint  Patient presents with   Right Hip - Pain      HPI: The patient is a 38 year old woman who presents today complaining of right hip pain which has been significantly worse radiating into her groin ongoing for the last 2 weeks but this episode has a longstanding history of lumbar radiculopathy since she was a teen she does not have any new injuries or falls she has significant pain with sitting getting from a seated to a standing position difficulty sleeping due to pain difficulty getting up from the floor when she is playing with her young child she is unable to work due to pain.  Complaining of shooting pain down the entire right lower extremity some radicular pain on the left but the right is significantly worse she has been using ibuprofen  without relief  Assessment & Plan: Visit Diagnoses:  1. Pain in right hip     Plan: Plan to follow-up on her lumbar MRI hopes this will be approved she will follow-up with Dr. Eldonna pending MRI results  Follow-Up Instructions: No follow-ups on file.   Right Hip Exam   Tenderness  The patient is experiencing no tenderness.   Range of Motion  The patient has normal right hip ROM.  Tests  FABER: negative   Back Exam   Tenderness  The patient is experiencing no tenderness.   Muscle Strength  The patient has normal back strength.  Tests  Straight leg raise right: positive Straight leg raise left: positive      Patient is alert, oriented, no adenopathy, well-dressed, normal affect, normal respiratory effort.     Imaging: No results found. No images are attached to the encounter.  Labs: Lab Results  Component Value Date   HGBA1C 5.1 08/04/2020    REPTSTATUS 01/12/2021 FINAL 01/09/2021   CULT  01/09/2021    NO GROUP A STREP (S.PYOGENES) ISOLATED Performed at [facility], [address], [place], [place] [address]    LABORGA NO GROWTH 03/30/2013     Lab Results  Component Value Date   ALBUMIN 2.2 (L) 01/16/2021   ALBUMIN 3.8 07/19/2017   ALBUMIN 4.5 07/06/2016    No results found for: MG No results found for: VD25OH  No results found for: PREALBUMIN    Latest Ref Rng & Units 09/03/2023    2:38 PM 07/31/2023    4:03 PM 06/16/2023    9:14 PM  CBC EXTENDED  WBC 3.4 - 10.8 x10E3/uL 10.7  10.4    RBC 3.77 - 5.28 x10E6/uL 3.85  3.89    Hemoglobin 11.1 - 15.9 g/dL 88.5  88.5  88.0   HCT 34.0 - 46.6 % 36.1  36.4  35.0   Platelets 150 - 450 x10E3/uL 289  337       There is no height or weight on file to calculate BMI.  Orders:  Orders Placed This Encounter  Procedures   XR HIP UNILAT W OR W/O PELVIS 2-3 VIEWS RIGHT   No orders of the defined types were placed in this encounter.  Procedures: No procedures performed  Clinical Data: No additional findings.  ROS:  All other systems negative, except as noted in the HPI. Review of Systems  Objective: Vital Signs: There were no vitals taken for this visit.  Specialty Comments:  No specialty comments available.  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   GERD (gastroesophageal reflux disease)    History of gestational diabetes  01/21/2013   Kidney problem    Kideney reflux   Suicidal ideation    Tobacco use    URI 03/01/2009    Family History  Problem Relation Age of Onset   Multiple sclerosis Mother    Cervical cancer Mother    Breast cancer Maternal Grandmother        unsure of age    Past Surgical History:  Procedure Laterality Date   BOWEL RESECTION     CESAREAN SECTION N/A 02/25/2021   Procedure: CESAREAN SECTION;  Surgeon: Ozan, Jennifer, DO;  Location: [facility];  Service: Obstetrics;  Laterality: N/A;   CHOLECYSTECTOMY     Complex trunk closure  2005   CSF SHUNT  1987   TRACHEOSTOMY  1987   TUBAL LIGATION Bilateral 02/25/2021   Procedure: BILATERAL TUBAL LIGATION;  Surgeon: Ozan, Jennifer, DO;  Location: [facility]S;  Service: Obstetrics;  Laterality: Bilateral;   Social History   Occupational History   Not on file  Tobacco Use   Smoking status: Every Day    Current packs/day: 1.00    Types: Cigarettes, E-cigarettes   Smokeless tobacco: Never   Tobacco comments:    Also uses ecigarette  Vaping Use   Vaping status: Never Used  Substance and Sexual Activity   Alcohol use: No    Alcohol/week: 0.0 standard drinks of alcohol   Drug use: No   Sexual activity: Yes    Birth control/protection: None

## 2024-03-25 ENCOUNTER — Ambulatory Visit: Admitting: Family

## 2024-03-25 ENCOUNTER — Encounter: Payer: Self-pay | Admitting: Family

## 2024-03-25 DIAGNOSIS — M5442 Lumbago with sciatica, left side: Secondary | ICD-10-CM

## 2024-03-25 DIAGNOSIS — M25551 Pain in right hip: Secondary | ICD-10-CM | POA: Diagnosis not present

## 2024-03-25 DIAGNOSIS — M5416 Radiculopathy, lumbar region: Secondary | ICD-10-CM | POA: Diagnosis not present

## 2024-03-25 DIAGNOSIS — M5441 Lumbago with sciatica, right side: Secondary | ICD-10-CM

## 2024-03-25 DIAGNOSIS — M7918 Myalgia, other site: Secondary | ICD-10-CM | POA: Diagnosis not present

## 2024-03-25 DIAGNOSIS — G8929 Other chronic pain: Secondary | ICD-10-CM

## 2024-03-25 NOTE — Progress Notes (Signed)
 Office Visit Note   Patient: Ann Brown           Date of Birth: 1985-11-17           MRN: [id number] Visit Date: 03/25/2024              Requested by: Jaycee Greig PARAS, NP [address] [place],  [place] [address] PCP: Jaycee Greig PARAS, NP  Chief Complaint  Patient presents with   Right Hip - Pain      HPI: The patient is a 38 year old woman who presents today complaining of right hip pain she is complaining of buttock and lateral hip pain which radiates into her groin and she is having some radiation in her medial thigh. This has been ongoing since summer without improvement. Has been working on HEP for spine and hip strengthening without improvement.  Has a longstanding history of sciatica lumbar radiculopathy she reports her symptoms feel different than that  Reports popping in her hip and groin  She reports that she finds that she has difficulty leaning forward for work she must lean over on a shopping cart for relief  She does work bending at the waist she also has a 11-year-old which she often carries and puts extra strain on her back  Assessment & Plan: Visit Diagnoses: No diagnosis found.  Plan: Unremarkable hip exam favored to be lumbar radiculopathy.  Has had difficulty with scheduling MRI of the lumbar spine.  Will refer to Dr. Lyda clinic for evaluation and possible steroid injection  Follow-Up Instructions: No follow-ups on file.   Right Hip Exam  Right hip exam is normal.   Tenderness  The patient is experiencing no tenderness.   Range of Motion  The patient has normal right hip ROM.  Muscle Strength  The patient has normal right hip strength.  Tests  FABER: negative  Other  Erythema: absent Pulse: present   Back Exam   Tenderness  The patient is experiencing no tenderness.   Range of Motion  The patient has normal back ROM.  Muscle Strength  The patient has normal back strength.  Tests  Straight leg raise right:  negative Straight leg raise left: negative  Other  Gait: normal  Erythema: no back redness      Patient is alert, oriented, no adenopathy, well-dressed, normal affect, normal respiratory effort.     Imaging: No results found. No images are attached to the encounter.  Labs: Lab Results  Component Value Date   HGBA1C 5.1 08/04/2020   REPTSTATUS 01/12/2021 FINAL 01/09/2021   CULT  01/09/2021    NO GROUP A STREP (S.PYOGENES) ISOLATED Performed at [facility], [address] [place], [place] [address]    LABORGA NO GROWTH 03/30/2013     Lab Results  Component Value Date   ALBUMIN 2.2 (L) 01/16/2021   ALBUMIN 3.8 07/19/2017   ALBUMIN 4.5 07/06/2016    No results found for: MG No results found for: VD25OH  No results found for: PREALBUMIN    Latest Ref Rng & Units 09/03/2023    2:38 PM 07/31/2023    4:03 PM 06/16/2023    9:14 PM  CBC EXTENDED  WBC 3.4 - 10.8 x10E3/uL 10.7  10.4    RBC 3.77 - 5.28 x10E6/uL 3.85  3.89    Hemoglobin 11.1 - 15.9 g/dL 88.5  88.5  88.0   HCT 34.0 - 46.6 % 36.1  36.4  35.0   Platelets 150 - 450 x10E3/uL  289  337       There is no height or weight on file to calculate BMI.  Orders:  No orders of the defined types were placed in this encounter.  No orders of the defined types were placed in this encounter.    Procedures: No procedures performed  Clinical Data: No additional findings.  ROS:  All other systems negative, except as noted in the HPI. Review of Systems  Constitutional:  Negative for chills and fever.  Cardiovascular:  Negative for leg swelling.  Musculoskeletal:  Positive for myalgias.  Neurological:  Negative for weakness and numbness.    Objective: Vital Signs: There were no vitals taken for this visit.  Specialty Comments:  No specialty comments available.  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   GERD (gastroesophageal reflux disease)    History of gestational diabetes 01/21/2013   Kidney problem    Kideney reflux   Suicidal ideation    Tobacco use    URI 03/01/2009    Family History  Problem Relation Age of Onset   Multiple sclerosis Mother    Cervical cancer Mother    Breast cancer Maternal Grandmother        unsure of age    Past Surgical History:  Procedure Laterality Date   BOWEL RESECTION     CESAREAN SECTION N/A 02/25/2021   Procedure: CESAREAN SECTION;  Surgeon: Ozan, Jennifer, DO;  Location: [facility];  Service: Obstetrics;  Laterality: N/A;   CHOLECYSTECTOMY     Complex trunk closure  2005   CSF SHUNT  1987   TRACHEOSTOMY  1987   TUBAL LIGATION Bilateral 02/25/2021   Procedure: BILATERAL TUBAL LIGATION;  Surgeon: Ozan, Jennifer, DO;  Location: [facility];  Service: Obstetrics;  Laterality: Bilateral;   Social History   Occupational History   Not on file  Tobacco Use   Smoking status: Every Day    Current packs/day: 1.00    Types: Cigarettes, E-cigarettes   Smokeless tobacco: Never   Tobacco comments:    Also uses ecigarette  Vaping Use   Vaping status: Never Used  Substance and Sexual Activity   Alcohol use: No    Alcohol/week: 0.0 standard drinks of alcohol   Drug use: No   Sexual activity: Yes    Birth control/protection: None

## 2024-03-27 ENCOUNTER — Encounter: Payer: Self-pay | Admitting: Family

## 2024-05-07 ENCOUNTER — Telehealth: Admitting: Family Medicine

## 2024-05-07 DIAGNOSIS — H65191 Other acute nonsuppurative otitis media, right ear: Secondary | ICD-10-CM

## 2024-05-07 MED ORDER — CLINDAMYCIN HCL 300 MG PO CAPS: 300.0000 mg | ORAL_CAPSULE | Freq: Three times a day (TID) | ORAL | 0 refills | Status: AC

## 2024-05-07 NOTE — Patient Instructions (Signed)
 " Ann Brown, thank you for joining Chiquita CHRISTELLA Barefoot, NP for today's virtual visit.  While this provider is not your primary care provider (PCP), if your PCP is located in our provider database this encounter information will be shared with them immediately following your visit.   A [facility] MyChart account gives you access to today's visit and all your visits, tests, and labs performed at [facility]  click here if you don't have a [facility] MyChart account or go to [email or web address]  Consent: (Patient) Ann Brown provided verbal consent for this virtual visit at the beginning of the encounter.  Current Medications:  Current Outpatient Medications:    clindamycin  (CLEOCIN ) 300 MG capsule, Take 1 capsule (300 mg total) by mouth 3 (three) times daily for 7 days., Disp: 21 capsule, Rfl: 0   acetaminophen  (TYLENOL ) 325 MG tablet, Take 2 tablets (650 mg total) by mouth every 6 (six) hours as needed for moderate pain., Disp: 60 tablet, Rfl: 0   albuterol  (PROVENTIL ) (2.5 MG/3ML) 0.083% nebulizer solution, Take 3 mLs (2.5 mg total) by nebulization every 6 (six) hours as needed for wheezing or shortness of breath., Disp: 75 mL, Rfl: 0   albuterol  (VENTOLIN  HFA) 108 (90 Base) MCG/ACT inhaler, Inhale 1-2 puffs into the lungs every 6 (six) hours as needed., Disp: 8 g, Rfl: 0   amphetamine -dextroamphetamine  (ADDERALL) 20 MG tablet, Take 20 mg by mouth 2 (two) times daily., Disp: , Rfl:    budeson-glycopyrrolate-formoterol  (BREZTRI  AEROSPHERE) 160-9-4.8 MCG/ACT AERO inhaler, Inhale 1-2 puffs into the lungs in the morning and at bedtime., Disp: 10.7 g, Rfl: 6   buPROPion  (WELLBUTRIN  SR) 100 MG 12 hr tablet, take 1 tablet by oral route every morning, Disp: , Rfl:    busPIRone  (BUSPAR ) 5 MG tablet, Take 5 mg by mouth 2 (two) times daily as needed., Disp: , Rfl:    cetirizine  (ZYRTEC ) 10 MG tablet, Take 1 tablet (10 mg total) by mouth daily., Disp: 30 tablet, Rfl: 11    chlorhexidine  (PERIDEX ) 0.12 % solution, Use as directed 10 mLs in the mouth or throat 2 (two) times daily. (Patient not taking: Reported on 09/02/2023), Disp: 120 mL, Rfl: 0   esomeprazole  (NEXIUM ) 40 MG capsule, Take 1 capsule (40 mg total) by mouth 2 (two) times daily before a meal., Disp: 180 capsule, Rfl: 0   fluconazole  (DIFLUCAN ) 150 MG tablet, Take 1 tablet (150 mg total) by mouth every 3 (three) days as needed., Disp: 2 tablet, Rfl: 0   fluticasone  (FLONASE ) 50 MCG/ACT nasal spray, Place 2 sprays into both nostrils daily., Disp: 16 g, Rfl: 6   HYDROcodone -acetaminophen  (NORCO/VICODIN) 5-325 MG tablet, Take 1 tablet by mouth every 6 (six) hours as needed for moderate pain (pain score 4-6)., Disp: 19 tablet, Rfl: 0   hydrOXYzine (ATARAX/VISTARIL) 10 MG tablet, Take 10 mg by mouth at bedtime as needed. (Patient not taking: Reported on 09/02/2023), Disp: , Rfl:    ibuprofen  (ADVIL ) 600 MG tablet, Take 1 tablet (600 mg total) by mouth every 8 (eight) hours as needed. (Patient not taking: Reported on 09/02/2023), Disp: 30 tablet, Rfl: 0   ibuprofen  (ADVIL ) 800 MG tablet, Take 1 tablet (800 mg total) by mouth every 8 (eight) hours as needed. (Patient not taking: Reported on 09/02/2023), Disp: 30 tablet, Rfl: 0   lidocaine  (LIDODERM ) 5 %, Place 1 patch onto the skin daily. Remove & Discard patch within 12 hours or as directed by MD (Patient not taking: Reported on 09/02/2023), Disp:  30 patch, Rfl: 0   lidocaine  (LIDODERM ) 5 %, Place 1 patch onto the skin daily. Remove & Discard patch within 12 hours or as directed by MD (Patient not taking: Reported on 09/02/2023), Disp: 30 patch, Rfl: 0   methocarbamol  (ROBAXIN ) 500 MG tablet, Take 1 tablet (500 mg total) by mouth every 6 (six) hours as needed for muscle spasms. (Patient not taking: Reported on 09/02/2023), Disp: 30 tablet, Rfl: 0   predniSONE  (DELTASONE ) 10 MG tablet, Take 1 tablet (10 mg total) by mouth daily with breakfast., Disp: 20 tablet, Rfl: 0    predniSONE  (DELTASONE ) 20 MG tablet, Take 2 tablets (40 mg total) by mouth daily with breakfast., Disp: 10 tablet, Rfl: 0   Prenatal Vit-Fe Fumarate-FA (PREPLUS) 27-1 MG TABS, Take 1 tablet by mouth daily. (Patient not taking: Reported on 09/02/2023), Disp: 30 tablet, Rfl: 8   promethazine -dextromethorphan (PROMETHAZINE -DM) 6.25-15 MG/5ML syrup, Take 5 mLs by mouth 4 (four) times daily as needed., Disp: 118 mL, Rfl: 0   silver  sulfADIAZINE  (SILVADENE ) 1 % cream, Apply 1 application. topically daily. (Patient not taking: Reported on 09/02/2023), Disp: 50 g, Rfl: 0   trimethoprim -polymyxin b  (POLYTRIM ) ophthalmic solution, Place 1 drop into the right eye every 6 (six) hours. (Patient not taking: Reported on 09/02/2023), Disp: 10 mL, Rfl: 0   Medications ordered in this encounter:  Meds ordered this encounter  Medications   clindamycin  (CLEOCIN ) 300 MG capsule    Sig: Take 1 capsule (300 mg total) by mouth 3 (three) times daily for 7 days.    Dispense:  21 capsule    Refill:  0    Supervising Provider:   BLAISE ALEENE KIDD [[id number]]     *If you need refills on other medications prior to your next appointment, please contact your pharmacy*  Follow-Up: Call back or seek an in-person evaluation if the symptoms worsen or if the condition fails to improve as anticipated.  [facility] [phone]  Other Instructions  Suspected infection with broken tooth - Can use ice on outside jaw/cheek for swelling - Can also take tylenol  for pain with other medications - Discussed DenTemp putty that can be used to cover a broken tooth - Schedule a follow with a dentist as soon as possible (Can contact [facility] or [facility] [phone]] associated with [facility] health department if underinsured or uninsured) - Seek in person evaluation if symptoms fail to improve or if they worsen     If you have been instructed to have an in-person evaluation today at a  local Urgent Care facility, please use the link below. It will take you to a list of all of our available [facility], including address, phone number and hours of operation. Please do not delay care.  [facility]  If you or a family member do not have a primary care provider, use the link below to schedule a visit and establish care. When you choose a [facility] primary care physician or advanced practice provider, you gain a long-term partner in health. Find a Primary Care Provider  Learn more about 's in-office and virtual care options:  - Get Care Now  "

## 2024-05-07 NOTE — Progress Notes (Signed)
 " Virtual Visit Consent   Ann Brown, you are scheduled for a virtual visit with a [facility] provider today. Just as with appointments in the office, your consent must be obtained to participate. Your consent will be active for this visit and any virtual visit you may have with one of our providers in the next 365 days. If you have a MyChart account, a copy of this consent can be sent to you electronically.  As this is a virtual visit, video technology does not allow for your provider to perform a traditional examination. This may limit your provider's ability to fully assess your condition. If your provider identifies any concerns that need to be evaluated in person or the need to arrange testing (such as labs, EKG, etc.), we will make arrangements to do so. Although advances in technology are sophisticated, we cannot ensure that it will always work on either your end or our end. If the connection with a video visit is poor, the visit may have to be switched to a telephone visit. With either a video or telephone visit, we are not always able to ensure that we have a secure connection.  By engaging in this virtual visit, you consent to the provision of healthcare and authorize for your insurance to be billed (if applicable) for the services provided during this visit. Depending on your insurance coverage, you may receive a charge related to this service.  I need to obtain your verbal consent now. Are you willing to proceed with your visit today? Ann Brown has provided verbal consent on 05/07/2024 for a virtual visit (video or telephone). Chiquita CHRISTELLA Barefoot, NP  Date: 05/07/2024 11:32 AM   Virtual Visit via Video Note   I, Chiquita CHRISTELLA Barefoot, connected with  Ann Brown  ([id number], January 31, 1986) on 05/07/24 at 11:30 AM EST by a video-enabled telemedicine application and verified that I am speaking with the correct person using two identifiers.  Location: Patient: Virtual Visit Location  Patient: Home Provider: Virtual Visit Location Provider: Home Office   I discussed the limitations of evaluation and management by telemedicine and the availability of in person appointments. The patient expressed understanding and agreed to proceed.    History of Present Illness: Ann Brown is a 39 y.o. who identifies as a female who was assigned female at birth, and is being seen today for ear pain  Onset was 4-5 days ago- with some fluid sensation, and hurts when she is chewing, does have some broken teeth that need to be repaired, might because she feels a swollen gland near the ear jawline.  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  Resting comfortably  at home.  Head is normocephalic, atraumatic.  No labored breathing.  Speech is clear and coherent with logical content.  Patient is alert and oriented at baseline.  Few broken teeth  Assessment and Plan:  1. Other non-recurrent acute nonsuppurative otitis media of right ear (Primary)  - clindamycin  (CLEOCIN ) 300 MG capsule; Take 1 capsule (300 mg total) by mouth 3 (three) times daily for 7 days.  Dispense: 21 capsule; Refill: 0  Given the symptoms, history of broken teeth visually broken teeth still having some poor dentition that needs to  get repaired she is working on that, in addition to the symptoms of possible ear infection that are not responding to allergy like fluid treatments will cover both dental and ear infection with clindamycin .  Clindamycin  chosen as she has had Augmentin  in the last 3 months in addition to having penicillin  3 months prior to that.  Suspected infection with broken tooth and possible URI-ear infection - Can use ice on outside jaw/cheek for swelling - Can also take tylenol  for pain with other medications - Discussed DenTemp putty that can be used to cover a broken tooth - Schedule a follow with a dentist as soon as possible (Can contact [facility] or [facility] [[phone]] associated with [facility] health department if underinsured or uninsured) - Seek in person evaluation if symptoms fail to improve or if they worsen   Reviewed side effects, risks and benefits of medication.    Patient acknowledged agreement and understanding of the plan.   Past Medical, Surgical, Social History, Allergies, and Medications have been Reviewed.     Follow Up Instructions: I discussed the assessment and treatment plan with the patient. The patient was provided an opportunity to ask questions and all were answered. The patient agreed with the plan and demonstrated an understanding of the instructions.  A copy of instructions were sent to the patient via MyChart unless otherwise noted below.    The patient was advised to call back or seek an in-person evaluation if the symptoms worsen or if the condition fails to improve as anticipated.    Chiquita CHRISTELLA Barefoot, NP       [1]  Allergies Allergen Reactions   Diphenhydramine  Hcl Other (See Comments)   Other    Ketorolac  Other (See Comments)    Stomach pains   [2]  Current Outpatient Medications:    acetaminophen  (TYLENOL ) 325 MG tablet, Take 2 tablets (650 mg total) by mouth every 6 (six) hours as needed for moderate pain.,  Disp: 60 tablet, Rfl: 0   albuterol  (PROVENTIL ) (2.5 MG/3ML) 0.083% nebulizer solution, Take 3 mLs (2.5 mg total) by nebulization every 6 (six) hours as needed for wheezing or shortness of breath., Disp: 75 mL, Rfl: 0   albuterol  (VENTOLIN  HFA) 108 (90 Base) MCG/ACT inhaler, Inhale 1-2 puffs into the lungs every 6 (six) hours as needed., Disp: 8 g, Rfl: 0   amoxicillin -clavulanate (AUGMENTIN ) 875-125 MG tablet, Take 1 tablet by mouth 2 (two) times daily., Disp: 14 tablet, Rfl: 0   amphetamine -dextroamphetamine  (ADDERALL) 20 MG tablet, Take 20 mg by mouth 2 (two) times daily., Disp: , Rfl:    budeson-glycopyrrolate-formoterol  (BREZTRI  AEROSPHERE) 160-9-4.8 MCG/ACT AERO inhaler, Inhale 1-2 puffs into the lungs in the morning and at bedtime., Disp: 10.7 g, Rfl: 6   buPROPion  (WELLBUTRIN  SR) 100 MG 12 hr tablet, take 1 tablet by oral route every morning, Disp: , Rfl:    busPIRone  (BUSPAR ) 5 MG tablet, Take 5 mg by mouth 2 (two) times daily as needed., Disp: , Rfl:    cetirizine  (ZYRTEC ) 10 MG tablet, Take 1 tablet (10 mg total) by mouth daily., Disp: 30 tablet, Rfl: 11  chlorhexidine  (PERIDEX ) 0.12 % solution, Use as directed 10 mLs in the mouth or throat 2 (two) times daily. (Patient not taking: Reported on 09/02/2023), Disp: 120 mL, Rfl: 0   esomeprazole  (NEXIUM ) 40 MG capsule, Take 1 capsule (40 mg total) by mouth 2 (two) times daily before a meal., Disp: 180 capsule, Rfl: 0   fluconazole  (DIFLUCAN ) 150 MG tablet, Take 1 tablet (150 mg total) by mouth every 3 (three) days as needed., Disp: 2 tablet, Rfl: 0   fluticasone  (FLONASE ) 50 MCG/ACT nasal spray, Place 2 sprays into both nostrils daily., Disp: 16 g, Rfl: 6   HYDROcodone -acetaminophen  (NORCO/VICODIN) 5-325 MG tablet, Take 1 tablet by mouth every 6 (six) hours as needed for moderate pain (pain score 4-6)., Disp: 19 tablet, Rfl: 0   hydrOXYzine (ATARAX/VISTARIL) 10 MG tablet, Take 10 mg by mouth at bedtime as needed. (Patient not taking: Reported on  09/02/2023), Disp: , Rfl:    ibuprofen  (ADVIL ) 600 MG tablet, Take 1 tablet (600 mg total) by mouth every 8 (eight) hours as needed. (Patient not taking: Reported on 09/02/2023), Disp: 30 tablet, Rfl: 0   ibuprofen  (ADVIL ) 800 MG tablet, Take 1 tablet (800 mg total) by mouth every 8 (eight) hours as needed. (Patient not taking: Reported on 09/02/2023), Disp: 30 tablet, Rfl: 0   lidocaine  (LIDODERM ) 5 %, Place 1 patch onto the skin daily. Remove & Discard patch within 12 hours or as directed by MD (Patient not taking: Reported on 09/02/2023), Disp: 30 patch, Rfl: 0   lidocaine  (LIDODERM ) 5 %, Place 1 patch onto the skin daily. Remove & Discard patch within 12 hours or as directed by MD (Patient not taking: Reported on 09/02/2023), Disp: 30 patch, Rfl: 0   methocarbamol  (ROBAXIN ) 500 MG tablet, Take 1 tablet (500 mg total) by mouth every 6 (six) hours as needed for muscle spasms. (Patient not taking: Reported on 09/02/2023), Disp: 30 tablet, Rfl: 0   predniSONE  (DELTASONE ) 10 MG tablet, Take 1 tablet (10 mg total) by mouth daily with breakfast., Disp: 20 tablet, Rfl: 0   predniSONE  (DELTASONE ) 20 MG tablet, Take 2 tablets (40 mg total) by mouth daily with breakfast., Disp: 10 tablet, Rfl: 0   Prenatal Vit-Fe Fumarate-FA (PREPLUS) 27-1 MG TABS, Take 1 tablet by mouth daily. (Patient not taking: Reported on 09/02/2023), Disp: 30 tablet, Rfl: 8   promethazine -dextromethorphan (PROMETHAZINE -DM) 6.25-15 MG/5ML syrup, Take 5 mLs by mouth 4 (four) times daily as needed., Disp: 118 mL, Rfl: 0   silver  sulfADIAZINE  (SILVADENE ) 1 % cream, Apply 1 application. topically daily. (Patient not taking: Reported on 09/02/2023), Disp: 50 g, Rfl: 0   trimethoprim -polymyxin b  (POLYTRIM ) ophthalmic solution, Place 1 drop into the right eye every 6 (six) hours. (Patient not taking: Reported on 09/02/2023), Disp: 10 mL, Rfl: 0  "

## 2024-05-08 ENCOUNTER — Ambulatory Visit
